# Patient Record
Sex: Female | Born: 1975 | Race: Asian | Hispanic: No | Marital: Married | Smoking: Never smoker
Health system: Southern US, Community
[De-identification: ages and names within clinical notes are randomized; demographics above are authoritative.]

## PROBLEM LIST (undated history)

## (undated) ENCOUNTER — Inpatient Hospital Stay (HOSPITAL_COMMUNITY): Payer: Self-pay

## (undated) DIAGNOSIS — M109 Gout, unspecified: Secondary | ICD-10-CM

## (undated) DIAGNOSIS — N979 Female infertility, unspecified: Secondary | ICD-10-CM

## (undated) DIAGNOSIS — E119 Type 2 diabetes mellitus without complications: Secondary | ICD-10-CM

## (undated) DIAGNOSIS — D500 Iron deficiency anemia secondary to blood loss (chronic): Secondary | ICD-10-CM

## (undated) DIAGNOSIS — E039 Hypothyroidism, unspecified: Secondary | ICD-10-CM

## (undated) DIAGNOSIS — O24419 Gestational diabetes mellitus in pregnancy, unspecified control: Secondary | ICD-10-CM

## (undated) DIAGNOSIS — G629 Polyneuropathy, unspecified: Secondary | ICD-10-CM

## (undated) DIAGNOSIS — F419 Anxiety disorder, unspecified: Secondary | ICD-10-CM

## (undated) DIAGNOSIS — G709 Myoneural disorder, unspecified: Secondary | ICD-10-CM

## (undated) HISTORY — DX: Female infertility, unspecified: N97.9

## (undated) HISTORY — DX: Polyneuropathy, unspecified: G62.9

## (undated) HISTORY — DX: Gout, unspecified: M10.9

## (undated) HISTORY — DX: Hypothyroidism, unspecified: E03.9

## (undated) HISTORY — DX: Type 2 diabetes mellitus without complications: E11.9

## (undated) HISTORY — PX: THYROIDECTOMY: SHX17

---

## 2000-04-21 ENCOUNTER — Other Ambulatory Visit: Admission: RE | Admit: 2000-04-21 | Discharge: 2000-04-21 | Payer: Self-pay | Admitting: Obstetrics and Gynecology

## 2000-09-20 ENCOUNTER — Inpatient Hospital Stay (HOSPITAL_COMMUNITY): Admission: AD | Admit: 2000-09-20 | Discharge: 2000-09-20 | Payer: Self-pay | Admitting: *Deleted

## 2000-09-21 ENCOUNTER — Inpatient Hospital Stay (HOSPITAL_COMMUNITY): Admission: AD | Admit: 2000-09-21 | Discharge: 2000-09-21 | Payer: Self-pay | Admitting: *Deleted

## 2000-11-16 ENCOUNTER — Inpatient Hospital Stay (HOSPITAL_COMMUNITY): Admission: AD | Admit: 2000-11-16 | Discharge: 2000-11-18 | Payer: Self-pay | Admitting: Obstetrics and Gynecology

## 2000-11-19 ENCOUNTER — Encounter: Admission: RE | Admit: 2000-11-19 | Discharge: 2000-12-19 | Payer: Self-pay | Admitting: Obstetrics and Gynecology

## 2000-11-19 ENCOUNTER — Inpatient Hospital Stay (HOSPITAL_COMMUNITY): Admission: AD | Admit: 2000-11-19 | Discharge: 2000-11-19 | Payer: Self-pay | Admitting: Obstetrics and Gynecology

## 2000-12-27 ENCOUNTER — Other Ambulatory Visit: Admission: RE | Admit: 2000-12-27 | Discharge: 2000-12-27 | Payer: Self-pay | Admitting: Obstetrics and Gynecology

## 2002-03-25 ENCOUNTER — Encounter (INDEPENDENT_AMBULATORY_CARE_PROVIDER_SITE_OTHER): Payer: Self-pay | Admitting: *Deleted

## 2002-03-25 LAB — CONVERTED CEMR LAB

## 2002-04-12 ENCOUNTER — Encounter: Admission: RE | Admit: 2002-04-12 | Discharge: 2002-04-12 | Payer: Self-pay | Admitting: Family Medicine

## 2002-04-13 ENCOUNTER — Encounter: Admission: RE | Admit: 2002-04-13 | Discharge: 2002-04-13 | Payer: Self-pay | Admitting: Family Medicine

## 2002-07-06 ENCOUNTER — Encounter: Admission: RE | Admit: 2002-07-06 | Discharge: 2002-07-06 | Payer: Self-pay | Admitting: Family Medicine

## 2002-07-09 ENCOUNTER — Ambulatory Visit (HOSPITAL_COMMUNITY): Admission: RE | Admit: 2002-07-09 | Discharge: 2002-07-09 | Payer: Self-pay | Admitting: General Surgery

## 2002-08-06 ENCOUNTER — Encounter: Admission: RE | Admit: 2002-08-06 | Discharge: 2002-08-06 | Payer: Self-pay | Admitting: Family Medicine

## 2002-08-13 ENCOUNTER — Encounter: Admission: RE | Admit: 2002-08-13 | Discharge: 2002-08-13 | Payer: Self-pay | Admitting: Family Medicine

## 2002-08-24 ENCOUNTER — Encounter: Admission: RE | Admit: 2002-08-24 | Discharge: 2002-08-24 | Payer: Self-pay | Admitting: Family Medicine

## 2002-08-24 ENCOUNTER — Inpatient Hospital Stay (HOSPITAL_COMMUNITY): Admission: AD | Admit: 2002-08-24 | Discharge: 2002-08-24 | Payer: Self-pay | Admitting: Family Medicine

## 2002-08-28 ENCOUNTER — Encounter: Admission: RE | Admit: 2002-08-28 | Discharge: 2002-08-28 | Payer: Self-pay | Admitting: Family Medicine

## 2002-09-04 ENCOUNTER — Encounter: Admission: RE | Admit: 2002-09-04 | Discharge: 2002-09-04 | Payer: Self-pay | Admitting: Family Medicine

## 2002-09-10 ENCOUNTER — Ambulatory Visit (HOSPITAL_COMMUNITY): Admission: RE | Admit: 2002-09-10 | Discharge: 2002-09-10 | Payer: Self-pay | Admitting: Family Medicine

## 2002-09-18 ENCOUNTER — Encounter: Admission: RE | Admit: 2002-09-18 | Discharge: 2002-09-18 | Payer: Self-pay | Admitting: Family Medicine

## 2002-09-27 ENCOUNTER — Inpatient Hospital Stay (HOSPITAL_COMMUNITY): Admission: AD | Admit: 2002-09-27 | Discharge: 2002-09-29 | Payer: Self-pay | Admitting: Obstetrics and Gynecology

## 2004-02-23 DIAGNOSIS — E0520 Thyrotoxicosis with toxic multinodular goiter without thyrotoxic crisis or storm: Secondary | ICD-10-CM

## 2004-02-23 HISTORY — DX: Thyrotoxicosis with toxic multinodular goiter without thyrotoxic crisis or storm: E05.20

## 2004-02-23 HISTORY — PX: THYROIDECTOMY: SHX17

## 2004-09-29 ENCOUNTER — Encounter (HOSPITAL_COMMUNITY): Admission: RE | Admit: 2004-09-29 | Discharge: 2004-12-28 | Payer: Self-pay | Admitting: Endocrinology

## 2004-10-29 ENCOUNTER — Other Ambulatory Visit: Admission: RE | Admit: 2004-10-29 | Discharge: 2004-10-29 | Payer: Self-pay | Admitting: Interventional Radiology

## 2004-10-29 ENCOUNTER — Encounter: Admission: RE | Admit: 2004-10-29 | Discharge: 2004-10-29 | Payer: Self-pay | Admitting: Endocrinology

## 2004-10-29 ENCOUNTER — Encounter (INDEPENDENT_AMBULATORY_CARE_PROVIDER_SITE_OTHER): Payer: Self-pay | Admitting: Specialist

## 2004-12-10 ENCOUNTER — Ambulatory Visit (HOSPITAL_COMMUNITY): Admission: RE | Admit: 2004-12-10 | Discharge: 2004-12-11 | Payer: Self-pay | Admitting: General Surgery

## 2004-12-10 ENCOUNTER — Encounter (INDEPENDENT_AMBULATORY_CARE_PROVIDER_SITE_OTHER): Payer: Self-pay | Admitting: *Deleted

## 2005-12-26 ENCOUNTER — Emergency Department (HOSPITAL_COMMUNITY): Admission: EM | Admit: 2005-12-26 | Discharge: 2005-12-26 | Payer: Self-pay | Admitting: Family Medicine

## 2006-04-21 DIAGNOSIS — E890 Postprocedural hypothyroidism: Secondary | ICD-10-CM | POA: Insufficient documentation

## 2006-04-22 ENCOUNTER — Encounter (INDEPENDENT_AMBULATORY_CARE_PROVIDER_SITE_OTHER): Payer: Self-pay | Admitting: *Deleted

## 2008-09-23 ENCOUNTER — Emergency Department (HOSPITAL_COMMUNITY): Admission: EM | Admit: 2008-09-23 | Discharge: 2008-09-23 | Payer: Self-pay | Admitting: Family Medicine

## 2008-11-25 ENCOUNTER — Emergency Department (HOSPITAL_COMMUNITY): Admission: EM | Admit: 2008-11-25 | Discharge: 2008-11-25 | Payer: Self-pay | Admitting: Family Medicine

## 2008-11-26 ENCOUNTER — Emergency Department (HOSPITAL_COMMUNITY): Admission: EM | Admit: 2008-11-26 | Discharge: 2008-11-26 | Payer: Self-pay | Admitting: Family Medicine

## 2008-12-17 ENCOUNTER — Encounter (HOSPITAL_COMMUNITY): Admission: RE | Admit: 2008-12-17 | Discharge: 2009-02-21 | Payer: Self-pay | Admitting: Endocrinology

## 2009-01-20 ENCOUNTER — Ambulatory Visit (HOSPITAL_COMMUNITY): Admission: RE | Admit: 2009-01-20 | Discharge: 2009-01-20 | Payer: Self-pay | Admitting: Endocrinology

## 2009-06-08 ENCOUNTER — Emergency Department (HOSPITAL_COMMUNITY): Admission: EM | Admit: 2009-06-08 | Discharge: 2009-06-08 | Payer: Self-pay | Admitting: Family Medicine

## 2009-10-11 ENCOUNTER — Emergency Department (HOSPITAL_COMMUNITY): Admission: EM | Admit: 2009-10-11 | Discharge: 2009-10-11 | Payer: Self-pay | Admitting: Family Medicine

## 2010-01-19 ENCOUNTER — Emergency Department (HOSPITAL_COMMUNITY): Admission: EM | Admit: 2010-01-19 | Discharge: 2010-01-19 | Payer: Self-pay | Admitting: Family Medicine

## 2010-05-05 LAB — POCT I-STAT, CHEM 8
BUN: 10 mg/dL (ref 6–23)
Calcium, Ion: 1.12 mmol/L (ref 1.12–1.32)
Chloride: 104 mEq/L (ref 96–112)
Creatinine, Ser: 0.7 mg/dL (ref 0.4–1.2)
Glucose, Bld: 103 mg/dL — ABNORMAL HIGH (ref 70–99)
HCT: 41 % (ref 36.0–46.0)
Hemoglobin: 13.9 g/dL (ref 12.0–15.0)
Potassium: 3.7 mEq/L (ref 3.5–5.1)
Sodium: 139 mEq/L (ref 135–145)
TCO2: 26 mmol/L (ref 0–100)

## 2010-05-05 LAB — POCT PREGNANCY, URINE: Preg Test, Ur: NEGATIVE

## 2010-05-08 LAB — POCT I-STAT, CHEM 8
BUN: 10 mg/dL (ref 6–23)
Calcium, Ion: 1.14 mmol/L (ref 1.12–1.32)
Chloride: 106 mEq/L (ref 96–112)
Creatinine, Ser: 0.8 mg/dL (ref 0.4–1.2)
Glucose, Bld: 91 mg/dL (ref 70–99)
HCT: 42 % (ref 36.0–46.0)
Hemoglobin: 14.3 g/dL (ref 12.0–15.0)
Potassium: 4.3 mEq/L (ref 3.5–5.1)
Sodium: 140 mEq/L (ref 135–145)
TCO2: 25 mmol/L (ref 0–100)

## 2010-05-12 LAB — POCT RAPID STREP A (OFFICE): Streptococcus, Group A Screen (Direct): NEGATIVE

## 2010-05-27 LAB — HCG, SERUM, QUALITATIVE: Preg, Serum: NEGATIVE

## 2010-05-31 LAB — POCT URINALYSIS DIP (DEVICE)
Bilirubin Urine: NEGATIVE
Glucose, UA: NEGATIVE mg/dL
Ketones, ur: NEGATIVE mg/dL
Nitrite: NEGATIVE
Protein, ur: >=300 mg/dL — AB
Specific Gravity, Urine: 1.01 (ref 1.005–1.030)
Urobilinogen, UA: 0.2 mg/dL (ref 0.0–1.0)
pH: 7 (ref 5.0–8.0)

## 2010-05-31 LAB — URINE CULTURE: Colony Count: >=100000

## 2010-05-31 LAB — POCT PREGNANCY, URINE: Preg Test, Ur: NEGATIVE

## 2010-05-31 LAB — PREGNANCY, URINE: Preg Test, Ur: NEGATIVE

## 2010-07-10 NOTE — H&P (Signed)
[facility]  Patient:    Briana Fuentes, Briana Fuentes Visit Number: [id number] MRN: [id number]          Service Type: Attending:  Silverio Lay, M.D. Dictated by:   Silverio Lay, M.D. Adm. Date:  11/16/00                           History and Physical  DATE OF BIRTH:                [birth year]  REASON FOR ADMISSION:         Intrauterine pregnancy, at 40 weeks and 1 day, known for fetal right pelvic mass, probably ovarian in nature, to undergo elective induction of labor.  HISTORY OF PRESENT ILLNESS:   This is a 35 year old, married, Pakistani woman, gravida 1, para 0, with a due date by ultrasound of November 15, 2000, who is being admitted at 40 weeks and 1 day to undergo elective induction of labor for a favorable cervix and a baby female fetus with right pelvic mass thought to be of ovarian in origin.  She was last seen in the office on November 15, 2000, reporting good fetal activity, denying any leakage of water, denying any bleeding, denying any pregnancy-induced hypertension symptoms. She also reports a class A1 diabetes that is very well controlled with diet only, of course with elevation of CBGs with dietary indiscretions.  Her ultrasound in the office today for a nonreactive NST revealed a biophysical profile 8 on 10.  Umbilical artery Dopplers within normal limits. Amniotic fluid index at the higher range 20.7 or 90th percentile. Estimated fetal weight at 7 pounds 7 ounces, 25th percentile.  PRENATAL COURSE:              Reveals blood type O positive, sickle cell negative, calcemia negative, RPR nonreactive, rubella-immune, HBsAg negative. HIV negative. Gonorrhea negative. Chlamydia negative and Pap smear within normal limits. First trimester was complicated by unexplained hematuria which completely resolved. Second trimester showed a abnormalities AFP with an increase in Down syndrome risk of 1:199.  The patient underwent an ultrasound and a  change of due date which now normalized her AFP.  Her 20-week ultrasound revealed a normal anatomy survey. Estimated fetal weight at 63rd percentile, anterior placenta, normal cervical length.  Third trimester was complicated by preterm cervical change at 32 weeks which was managed with bed rest and betamethasone received on July 30 and July 31. Her 28-week glucose tolerance test was elevated, three-hour glucose tolerance test was compatible with class A1 diabetes, and the patient was subsequently managed with diet and remained well controlled throughout pregnancy. Her 29-week ultrasound revealed an estimated fetal weight in the 23rd percentile, normal amniotic fluid index and the 44th percentile.  At 33 weeks an ultrasound was performed for growth where estimated fetal weight was 37 percentile, amniotic fluid index was 64th percentile and a new appearance of a right cystic pelvic mass in the fetus between kidney and bladder measuring 3 x 2.6 x 3 cm. The patient was sent for a consultative ultrasound with Dr. Delton See at [facility] and his conclusions were that his ultrasound was compatible with ovarian mass of unknown etiology, possibly teratoma or ______.  He considered that delivery was safe at [facility], and he would recommend an MRI or a pelvic ultrasound at the birth of the baby. Her 35-week group B strep was negative.  The patient was rescanned at 36 weeks and the  right pelvic mass remained unchanged. On the ultrasound performed on September 24 we could not see the pelvic mass but the bladder seemed slightly enlarged.  ALLERGIES:                    No known drug allergies.  FAMILY HISTORY:               Father with myocardial infarct, hypertension and diabetes. Paternal grandfather with myocardial infarct.  SOCIAL HISTORY:               Married, nonsmoker.  Works as a Conservation officer, nature in [employer].  PHYSICAL EXAMINATION:  VITAL SIGNS:                  Current weight is 173  pounds .4 for a total weight gain of 38 pounds.  Blood pressure 98/60.  HEENT:                        Negative.  LUNGS:                        Clear.  HEART:                        Normal.  ABDOMEN:                      Gravid, nontender.  Fundal height at 39 cm, vertex presentation.  PELVIC:                       Vaginal examination, 3 cm, 90% effaced, vertex -1.  EXTREMITIES:                  Negative.  ASSESSMENT:                   Intrauterine pregnancy, at 41 weeks and 1 day with favorable cervix, fetal right pelvic mass of unknown etiology, most likely ovarian.  Class A1 well controlled diabetes, status post betamethasone on July 30 and July 31 and group B streptococcus negative.  PLAN:                         The patient is admitted on November 16, 2000, to undergo elective induction of labor with artificial rupture of membranes as well as Pitocin.  She is well aware of the slightly increased risks of cesarean section.  We will inform her pediatrician, Dr. Winona Legato of the birth of the baby for followup. Dictated by:   Silverio Lay, M.D. Attending:  Silverio Lay, M.D. DD:  11/15/00 TD:  11/15/00 Job: [id number]

## 2010-07-10 NOTE — Op Note (Signed)
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   PROCEDURE:  1.  Excision of central neck mass.  2.  Right fibroid lobectomy and isthmusectomy.   SURGEON:  Adolph Pollack, M.D.   ASSISTANT:  Currie Paris, M.D.   ANESTHESIA:  General.   INDICATIONS:  This 35 year old female had a large mass in the right lower  neck for over 5 years. It has been shown to be a multinodular goiter and  also has a cold nodule superiorly which was biopsied and had follicular  changes. She now presents for the above procedure. The procedure and the  risks have been discussed with her preoperatively.   TECHNIQUE:  She was seen in the holding area and brought to the operating  room, placed supine on the operating table and a general anesthetic was  administered. A roll was placed under her shoulders and her neck was placed  in the extension position. The neck and upper chest were sterilely prepped  and draped. An incision was marked approximately a fingerbreadth superior to  the heads of the clavicles and a curvilinear transverse neck incision was  made through the skin and subcutaneous tissue. The platysma muscle was  divided. Subplatysmal flaps were raised up to the thyroid cartilage  superiorly and down to the sternal notch inferiorly. Inferiorly, I found the  precervical fascia between the strap muscles and divided it. Using careful  blunt dissection and cautery, I   divided the strap muscle and dissected it  free from the goitrous lesion in the right thyroid gland. Using blunt  dissection, I was able to bring the lesion up toward the midline. Using more  blunt dissection, I separated thin attachments superiorly and inferiorly. I  then identified the superior pole vessels and divided them on the thyroid  capsule in between clips. I approached the inferior aspect of the gland and  divided  the inferior vessels in the midline between clips. I then used  blunt dissection and identified the recurrent laryngeal nerve and traced it  into its origin into the cricothyroid muscle. Underlying it was a superior  parathyroid gland. I saw the inferior parathyroid gland and dissected this  free from the inferior pole of the thyroid. Staying on the capsule, I then  divided middle thyroidal vessels between clips. The suspensory ligament of  Allyson Sabal was identified and the small veins were divided between clips  mobilizing the thyroid lobe.  The recurrent laryngeal nerve was under some  stretch but was left intact without evidence of injury. I  then dissected the  right lower thyroid gland off the trachea and put a clamp between the  isthmus and left lobe and resected the isthmus along with the goitrous  lesion in the right thyroid lobe. This was sent as frozen section and the  lesion in the upper pole was noted to be follicular but no obvious  malignancy.   I did note that on the anterior aspect of the strap muscles was a 1 cm mass,  I could not tell if it was a lymph node or otherwise and I resected this and  sent it for permanent section.   I inspected and irrigated the wound. There was some bleeding coming from an  area just superior and adjacent to the recurrent laryngeal nerve. I applied  some FloSeal and Surgicel and this lead to successful hemostasis. Further  inspection was performed. No bleeding was noted. The nerve was reidentified  and was intact.   The  strap muscles were then approximated with interrupted 3-0 Vicryl sutures  leaving the gap inferiorly. No blood was coming from that. The platysma  muscles were approximated with interrupted 3-0 Vicryl sutures. The skin was  closed with running 4-0 Monocryl subcuticular stitch followed by Steri-  Strips and sterile dressings. She tolerated the procedure well without any  apparent complications and was taken to the recovery room in satisfactory  condition.      Adolph Pollack, M.D.  Electronically Signed     TJR/MEDQ  D:  12/10/2004  T:  12/10/2004  Job:  [id number]   cc:   Dorisann Frames, M.D.  Fax: [phone]   Ursula Beath, MD  Fax: [phone]

## 2012-01-15 ENCOUNTER — Emergency Department (HOSPITAL_COMMUNITY): Payer: Self-pay

## 2012-01-15 ENCOUNTER — Emergency Department (HOSPITAL_COMMUNITY)
Admission: EM | Admit: 2012-01-15 | Discharge: 2012-01-16 | Disposition: A | Discharge status: Home / Self Care | Payer: Self-pay | Attending: Emergency Medicine | Admitting: Emergency Medicine

## 2012-01-15 ENCOUNTER — Encounter (HOSPITAL_COMMUNITY): Payer: Self-pay | Admitting: *Deleted

## 2012-01-15 DIAGNOSIS — H5710 Ocular pain, unspecified eye: Secondary | ICD-10-CM | POA: Insufficient documentation

## 2012-01-15 DIAGNOSIS — R109 Unspecified abdominal pain: Secondary | ICD-10-CM | POA: Insufficient documentation

## 2012-01-15 DIAGNOSIS — R51 Headache: Secondary | ICD-10-CM | POA: Insufficient documentation

## 2012-01-15 DIAGNOSIS — R110 Nausea: Secondary | ICD-10-CM | POA: Insufficient documentation

## 2012-01-15 LAB — CBC WITH DIFFERENTIAL/PLATELET
Basophils Absolute: 0.1 10*3/uL (ref 0.0–0.1)
Basophils Relative: 0 % (ref 0–1)
Eosinophils Absolute: 0.2 10*3/uL (ref 0.0–0.7)
Eosinophils Relative: 2 % (ref 0–5)
HCT: 38.7 % (ref 36.0–46.0)
Hemoglobin: 13.1 g/dL (ref 12.0–15.0)
Lymphocytes Relative: 29 % (ref 12–46)
Lymphs Abs: 4.3 10*3/uL — ABNORMAL HIGH (ref 0.7–4.0)
MCH: 29 pg (ref 26.0–34.0)
MCHC: 33.9 g/dL (ref 30.0–36.0)
MCV: 85.6 fL (ref 78.0–100.0)
Monocytes Absolute: 0.9 10*3/uL (ref 0.1–1.0)
Monocytes Relative: 6 % (ref 3–12)
Neutro Abs: 9.1 10*3/uL — ABNORMAL HIGH (ref 1.7–7.7)
Neutrophils Relative %: 63 % (ref 43–77)
Platelets: 310 10*3/uL (ref 150–400)
RBC: 4.52 MIL/uL (ref 3.87–5.11)
RDW: 13.5 % (ref 11.5–15.5)
WBC: 14.6 10*3/uL — ABNORMAL HIGH (ref 4.0–10.5)

## 2012-01-15 LAB — BASIC METABOLIC PANEL
BUN: 10 mg/dL (ref 6–23)
CO2: 21 mEq/L (ref 19–32)
Calcium: 9.5 mg/dL (ref 8.4–10.5)
Chloride: 102 mEq/L (ref 96–112)
Creatinine, Ser: 0.58 mg/dL (ref 0.50–1.10)
GFR calc Af Amer: >90 mL/min (ref >90)
GFR calc non Af Amer: >90 mL/min (ref >90)
Glucose, Bld: 104 mg/dL — ABNORMAL HIGH (ref 70–99)
Potassium: 3.7 mEq/L (ref 3.5–5.1)
Sodium: 136 mEq/L (ref 135–145)

## 2012-01-15 MED ORDER — ONDANSETRON HCL 4 MG/2ML IJ SOLN
4.0000 mg | Freq: Once | INTRAMUSCULAR | Status: AC
  Administered 2012-01-15: 4 mg via INTRAVENOUS
  Filled 2012-01-15: qty 2

## 2012-01-15 MED ORDER — MORPHINE SULFATE 4 MG/ML IJ SOLN
4.0000 mg | Freq: Once | INTRAMUSCULAR | Status: AC
  Administered 2012-01-15: 4 mg via INTRAVENOUS
  Filled 2012-01-15: qty 1

## 2012-01-15 MED ORDER — TETRACAINE HCL 0.5 % OP SOLN
2.0000 [drp] | Freq: Once | OPHTHALMIC | Status: AC
  Administered 2012-01-15: 2 [drp] via OPHTHALMIC
  Filled 2012-01-15: qty 2

## 2012-01-15 MED ORDER — FLUORESCEIN SODIUM 1 MG OP STRP
ORAL_STRIP | OPHTHALMIC | Status: AC
  Filled 2012-01-15: qty 2

## 2012-01-15 NOTE — ED Notes (Signed)
The pt is [redacted] weeks pregnant and has had a headache for 14 days.  She is a diabetic.  edc unknown her rt eye is red and irritated.  She has head pain on the rt side

## 2012-01-15 NOTE — ED Notes (Signed)
Patient back from CT.

## 2012-01-15 NOTE — ED Notes (Signed)
Notified pt that we need urine sample 

## 2012-01-15 NOTE — ED Notes (Signed)
Patient transported to CT 

## 2012-01-15 NOTE — ED Provider Notes (Signed)
History     CSN: [id number]  Arrival date & time 01/15/12  1907   First MD Initiated Contact with Patient 01/15/12 1925     Chief Complaint  Patient presents with  . Headache   HPI: Ms. Gaetz is a 36 yo F, who is currently [redacted] weeks pregnant, presents with headache, pain with eye movement and conjunctival infection. Symptoms started 14 days ago with mild left sided headache. She describes the pain as sharp, constant, located behind right eye, radiates to back of head, initially relieved partially with Tylenol. Associated with painful eye movement, conjunctival injection and proptosis. Today her pain worsened and she was no longer able to control pain with Tylenol so she presented for evaluation. She endorse blurred vision but no loss of vision. Also endorses mild abdominal cramping today and small amount of vaginal spotting.   No past medical history on file.  No past surgical history on file.  No family history on file.  History  Substance Use Topics  . Smoking status: Not on file  . Smokeless tobacco: Not on file  . Alcohol Use: Not on file    OB History    Grav Para Term Preterm Abortions TAB SAB Ect Mult Living   1              Review of Systems  Constitutional: Negative for chills, appetite change and fatigue.  HENT: Negative for congestion, rhinorrhea and trouble swallowing.   Eyes: Positive for pain, redness and visual disturbance. Negative for photophobia.  Respiratory: Negative for cough, shortness of breath and wheezing.   Cardiovascular: Negative for chest pain and leg swelling.  Gastrointestinal: Positive for nausea and abdominal pain. Negative for vomiting.  Genitourinary: Negative for dysuria, decreased urine volume and difficulty urinating.  Musculoskeletal: Negative for back pain and arthralgias.  Skin: Negative for color change and pallor.  Neurological: Positive for headaches. Negative for dizziness, speech difficulty and weakness.  Psychiatric/Behavioral:  Negative for confusion and agitation.  All other systems reviewed and are negative.    Allergies  Review of patient's allergies indicates no known allergies.  Home Medications   Current Outpatient Rx  Name  Route  Sig  Dispense  Refill  . BUTALBITAL-APAP-CAFF-COD 50-325-40-30 MG PO CAPS   Oral   Take 1 capsule by mouth every 4 (four) hours as needed. For headache         . LEVOTHYROXINE SODIUM 125 MCG PO TABS   Oral   Take 125 mcg by mouth daily.         [name] METFORMIN HCL ER (MOD) 500 MG PO TB24   Oral   Take 500 mg by mouth daily with breakfast.           BP 118/80  Pulse 74  Temp 98.3 F (36.8 C) (Oral)  Resp 20  SpO2 99%  Physical Exam  Nursing note and vitals reviewed. Constitutional: She is oriented to person, place, and time. She appears well-developed. She is cooperative. She does not have a sickly appearance.  HENT:  Head: Normocephalic and atraumatic.  Mouth/Throat: Mucous membranes are normal. No oropharyngeal exudate.  Eyes: EOM are normal. Pupils are equal, round, and reactive to light. Right conjunctiva is injected.         Mild right eye proptosis   Neck: Trachea normal and normal range of motion. Neck supple. No JVD present.  Cardiovascular: Normal rate, regular rhythm, S1 normal, S2 normal and normal heart sounds.   Pulmonary/Chest: Effort normal and breath sounds  normal.  Abdominal: Soft. Bowel sounds are normal.  Musculoskeletal: Normal range of motion.  Neurological: She is alert and oriented to person, place, and time. She has normal strength and normal reflexes. No cranial nerve deficit or sensory deficit. Gait normal. GCS eye subscore is 4. GCS verbal subscore is 5. GCS motor subscore is 6.  Skin: Skin is warm and dry.   ED Course  Procedures   Labs Reviewed - No data to display Ct Head Wo Contrast  01/15/2012  *RADIOLOGY REPORT*  Clinical Data: Right eye swelling.  Pain behind the right eye. Right frontal headache.  Proptosis.  The  patient is [redacted] weeks pregnant and abdomen was shielded for the examination.  CT HEAD WITHOUT CONTRAST  Technique:  Contiguous axial images were obtained from the base of the skull through the vertex without contrast.  Comparison: None.  Findings: The ventricles and sulci are symmetrical without significant effacement, displacement, or dilatation. No mass effect or midline shift. No abnormal extra-axial fluid collections. The grey-white matter junction is distinct. Basal cisterns are not effaced. No acute intracranial hemorrhage. No depressed skull fractures.  Visualized paranasal sinuses and mastoid air cells are not opacified.  The globes and extraocular muscles appear intact and symmetrical.  No retroconal or preseptal infiltration or mass.  IMPRESSION: No acute intracranial abnormalities.  Visualized orbits and paranasal sinuses appear symmetrical.   Original Report Authenticated By: Burman Nieves, M.D.    1. Headache   2. Eye pain    MDM   36 yo F, who is currently [redacted] weeks pregnant, presents with headache, pain with eye movement proptosis and conjunctival infection. Afebrile, vital signs stable. Concern for orbital cellulitis vs mass vs cavernous sinus thrombosis vs acute angle closure glaucoma. Patient also with mild abdominal cramping and spotting today. She has confirmed IUP at [redacted] weeks gestation. Labs reveal WBC 14.6, Hgb 13.1, normal lytes and cr. Head CT without acute abnormality. Ocular pressure 18. Vision 20/50 in R eye, 20/30 in L. No evidence of CN III palsy. Doubt orbital cellulitis as not febrile, no infection noted on CT. Unknown cause of symptoms. Discussed case with Ophthalmology who recommended steroid gtt and he will see in the AM. Felt cavernous sinus thrombosis was very unlikely as no CN III palsy. As for abdominal cramping and vaginal spotting she was RH + so no RhoGam indicated. Bedside US revealed FHR of 155, fetus active. Patient to f/u with OB on Tuesday. Instructed on eye drop use  to occlude lacrimal duct. Her pain was treated with morphine with relief of symptoms. Return precautions to include worsening HA, SOB, vision loss, fever, or other concerning symptoms were given. Patient in agreement with plan.   Reviewed imaging, labs and previous medical records, utilized in MDM  Clinical Impression 1. Headache 2. Proptosis 3. IUP        Margie Billet, MD 01/16/12 [phone]

## 2012-01-16 LAB — ABO/RH: ABO/RH(D): O POS

## 2012-01-16 MED ORDER — PREDNISOLONE ACETATE 0.12 % OP SUSP: 2.0000 [drp] | Freq: Four times a day (QID) | OPHTHALMIC | Status: DC

## 2012-01-16 MED ORDER — HYDROCODONE-ACETAMINOPHEN 5-325 MG PO TABS: 1.0000 | ORAL_TABLET | Freq: Four times a day (QID) | ORAL | Status: DC | PRN

## 2012-01-16 MED ORDER — PREDNISOLONE ACETATE 0.12 % OP SUSP
2.0000 [drp] | Freq: Once | OPHTHALMIC | Status: DC
  Filled 2012-01-16: qty 5

## 2012-01-16 NOTE — ED Provider Notes (Signed)
I saw and evaluated the patient, reviewed the resident's note and I agree with the findings and plan.  Patient with several week hx of right eye pain, redness, and swelling. CT scan of head and orbits negative for intracranial findings and or orbital cellulitis. Ocular pressures normal and not cw acute glaucoma. Discussed with on call Opthomalogy and they will follow up in office on Sunday. Medications prescribed as per their recommendations.   Shelda Jakes, MD 01/16/12 1300

## 2012-02-09 ENCOUNTER — Ambulatory Visit: Payer: Self-pay | Admitting: Dietician

## 2012-04-13 ENCOUNTER — Other Ambulatory Visit: Payer: BC Managed Care – PPO

## 2012-04-13 LAB — POCT PREGNANCY, URINE: Preg Test, Ur: POSITIVE — AB

## 2012-04-19 DIAGNOSIS — O24919 Unspecified diabetes mellitus in pregnancy, unspecified trimester: Secondary | ICD-10-CM | POA: Insufficient documentation

## 2012-05-01 ENCOUNTER — Inpatient Hospital Stay (HOSPITAL_COMMUNITY)
Admission: AD | Admit: 2012-05-01 | Discharge: 2012-05-01 | Disposition: A | Discharge status: Home / Self Care | Payer: BC Managed Care – PPO | Source: Ambulatory Visit | Attending: Obstetrics & Gynecology | Admitting: Obstetrics & Gynecology

## 2012-05-01 ENCOUNTER — Encounter (HOSPITAL_COMMUNITY): Payer: Self-pay | Admitting: *Deleted

## 2012-05-01 ENCOUNTER — Inpatient Hospital Stay (HOSPITAL_COMMUNITY): Payer: BC Managed Care – PPO

## 2012-05-01 ENCOUNTER — Encounter: Payer: Self-pay | Admitting: Obstetrics and Gynecology

## 2012-05-01 ENCOUNTER — Ambulatory Visit (INDEPENDENT_AMBULATORY_CARE_PROVIDER_SITE_OTHER): Payer: BC Managed Care – PPO | Admitting: Obstetrics and Gynecology

## 2012-05-01 ENCOUNTER — Other Ambulatory Visit: Payer: Self-pay | Admitting: Obstetrics and Gynecology

## 2012-05-01 VITALS — BP 93/63 | Temp 96.8°F | Ht 65.0 in | Wt 172.7 lb

## 2012-05-01 DIAGNOSIS — O4590 Premature separation of placenta, unspecified, unspecified trimester: Secondary | ICD-10-CM

## 2012-05-01 DIAGNOSIS — O09212 Supervision of pregnancy with history of pre-term labor, second trimester: Secondary | ICD-10-CM

## 2012-05-01 DIAGNOSIS — E119 Type 2 diabetes mellitus without complications: Secondary | ICD-10-CM | POA: Insufficient documentation

## 2012-05-01 DIAGNOSIS — O09529 Supervision of elderly multigravida, unspecified trimester: Secondary | ICD-10-CM

## 2012-05-01 DIAGNOSIS — O4690 Antepartum hemorrhage, unspecified, unspecified trimester: Secondary | ICD-10-CM | POA: Insufficient documentation

## 2012-05-01 DIAGNOSIS — O09219 Supervision of pregnancy with history of pre-term labor, unspecified trimester: Secondary | ICD-10-CM | POA: Insufficient documentation

## 2012-05-01 DIAGNOSIS — O24912 Unspecified diabetes mellitus in pregnancy, second trimester: Secondary | ICD-10-CM

## 2012-05-01 DIAGNOSIS — O99891 Other specified diseases and conditions complicating pregnancy: Secondary | ICD-10-CM

## 2012-05-01 DIAGNOSIS — O4592 Premature separation of placenta, unspecified, second trimester: Secondary | ICD-10-CM

## 2012-05-01 DIAGNOSIS — E0590 Thyrotoxicosis, unspecified without thyrotoxic crisis or storm: Secondary | ICD-10-CM

## 2012-05-01 DIAGNOSIS — O24919 Unspecified diabetes mellitus in pregnancy, unspecified trimester: Secondary | ICD-10-CM | POA: Insufficient documentation

## 2012-05-01 DIAGNOSIS — O09522 Supervision of elderly multigravida, second trimester: Secondary | ICD-10-CM

## 2012-05-01 DIAGNOSIS — O09299 Supervision of pregnancy with other poor reproductive or obstetric history, unspecified trimester: Secondary | ICD-10-CM

## 2012-05-01 DIAGNOSIS — E039 Hypothyroidism, unspecified: Secondary | ICD-10-CM | POA: Insufficient documentation

## 2012-05-01 LAB — POCT URINALYSIS DIP (DEVICE)
Bilirubin Urine: NEGATIVE
Glucose, UA: NEGATIVE mg/dL
Ketones, ur: NEGATIVE mg/dL
Nitrite: NEGATIVE
Protein, ur: 30 mg/dL — AB
Specific Gravity, Urine: 1.025 (ref 1.005–1.030)
Urobilinogen, UA: 0.2 mg/dL (ref 0.0–1.0)
pH: 6 (ref 5.0–8.0)

## 2012-05-01 LAB — WET PREP, GENITAL
Clue Cells Wet Prep HPF POC: NONE SEEN
Trich, Wet Prep: NONE SEEN
Yeast Wet Prep HPF POC: NONE SEEN

## 2012-05-01 MED ORDER — HYDROXYPROGESTERONE CAPROATE 250 MG/ML IM OIL: 250.0000 mg | TOPICAL_OIL | INTRAMUSCULAR | Status: DC

## 2012-05-01 MED ORDER — INFLUENZA VIRUS VACC SPLIT PF IM SUSP
0.5000 mL | Freq: Once | INTRAMUSCULAR | Status: AC
  Administered 2012-05-01: 0.5 mL via INTRAMUSCULAR

## 2012-05-01 NOTE — Progress Notes (Signed)
Subjective:    Briana Fuentes is a [id number] [redacted]w[redacted]d being seen today for her first obstetrical visit.  Her obstetrical history is significant for advanced maternal age and type II DM, hypothyroidism and h/o preterm birth. Patient does intend to breast feed. Pregnancy history fully reviewed.  Patient reports no complaints. This was a pregnancy conceived by IUI. She self- referred herself to our office secondary to increase cost of prenatal care at [facility]. Patient is very excited about this pregnancy. She has been taking metformin 500 BID and states her postprandial cbg are in 140's range. Patient states she was instructed to increase to 1000 BID but was not able tolerate secondary to GI upset. Patient reports normal TSH 2 months ago. Patient reports being approximately 36 weeks when PPROM occurred with last pregnancy. She is interested in receiving 17-P for this pregnancy.  Filed Vitals:   05/01/12 0954 05/01/12 0957  BP: 93/63   Temp: 96.8 F (36 C)   Height:  5\' 5"  (1.651 m)  Weight: 172 lb 11.2 oz (78.336 kg)     HISTORY: OB History   Grav Para Term Preterm Abortions TAB SAB Ect Mult Living   3 2 1 1  0 0 0 0 0 2     # Outc Date GA Lbr Len/2nd Wgt Sex Del Anes PTL Lv   1 TRM 9/02 [redacted]w[redacted]d  7lb8oz(3.402kg) F SVD EPI  Yes   2 PRE 8/04 [redacted]w[redacted]d  5lb8oz(2.495kg) M SVD EPI Yes Yes   Comments: patient states her water broke 1 month early- not sure of exact gestation    3 CUR              Past Medical History  Diagnosis Date  . Hypothyroidism   . Diabetes mellitus without complication   . Infertility, female    Past Surgical History  Procedure Laterality Date  . Thyroidectomy     Family History  Problem Relation Age of Onset  . Heart disease Father   . Stroke Father      Exam    Uterus:     Pelvic Exam:    Perineum: Normal Perineum   Vulva: normal   Vagina:  normal mucosa   pH:    Cervix: closed and long   Adnexa: not evaluated   Bony Pelvis: android  System: Breast:   normal appearance, no masses or tenderness   Skin: normal coloration and turgor, no rashes    Neurologic: oriented, grossly non-focal   Extremities: normal strength, tone, and muscle mass   HEENT extra ocular movement intact   Mouth/Teeth mucous membranes moist, pharynx normal without lesions   Neck supple and no masses   Cardiovascular: regular rate and rhythm   Respiratory:  chest clear, no wheezing, crepitations, rhonchi, normal symmetric air entry   Abdomen: soft, gravid, NT   Urinary:       Assessment:    Pregnancy: [id number] Patient Active Problem List  Diagnosis  . HYPERTHYROIDISM, NOS  . Diabetes mellitus, antepartum  . AMA (advanced maternal age) multigravida 35+  . DM w/o complication type II  . Hyperthyroidism complicating pregnancy  . Previous preterm delivery, antepartum        Plan:     Initial labs drawn. Prenatal vitamins. Problem list reviewed and updated. Genetic Screening discussed : offered [facility].  Ultrasound discussed; fetal survey: ordered. Advised patient to monitor CBG fasting and 2 hr postprandial. Patient to meet with diabetic counselor Patient referred to MFM for anatomy ultrasound with genetic counseling  secondary to AMA Fetal echo also ordered Baseline 24 hr urine also ordered Patient interested in weekly 17-p  Follow up in 2 weeks. 50% of 30 min visit spent on counseling and coordination of care.     CONSTANT,[name] 05/01/2012

## 2012-05-01 NOTE — Progress Notes (Signed)
Pulse- 78 Patient reports occasional abdominal pain

## 2012-05-01 NOTE — MAU Note (Addendum)
States had first appointment in [facility] today. States she had blood drawn and was given a flu shot. States started bleeding around 1600. Blood was in toilet and on panties. States she had a scant amount of bleeding in 1st trimester. None since until now. States she has some upper abdominal cramping and burning. States she is on Metformin for diabetes. States started to have intercourse yesterday, but stopped because they were scared. States she noted some rectal bleeding today also.

## 2012-05-01 NOTE — MAU Provider Note (Signed)
History     CSN: [id number]  Arrival date and time: 05/01/12 1621   First Provider Initiated Contact with Patient 05/01/12 1642      Chief Complaint  Patient presents with  . Vaginal Bleeding   HPI This is a 37 y.o. female at [redacted]w[redacted]d who presents with c/o vaginal bleeding. Was seen by Dr Jolayne Panther today but states there was no pelvic done. Went to the bathroom and saw bright red blood. Denies abdominal pain or contractions.  Rn Note: States had first appointment in [facility] today. States she had blood drawn and was given a flu shot. States started bleeding around 1600. Blood was in toilet and on panties. States she had a scant amount of bleeding in 1st trimester. None since until now. States she has some upper abdominal cramping and burning. States she is on Metformin for diabetes. States started to have intercourse yesterday, but stopped because they were scared. States she noted some rectal bleeding today also  OB History   Grav Para Term Preterm Abortions TAB SAB Ect Mult Living   3 2 1 1  0 0 0 0 0 2      Past Medical History  Diagnosis Date  . Hypothyroidism   . Diabetes mellitus without complication   . Infertility, female     Past Surgical History  Procedure Laterality Date  . Thyroidectomy      Family History  Problem Relation Age of Onset  . Heart disease Father   . Stroke Father     History  Substance Use Topics  . Smoking status: Never Smoker   . Smokeless tobacco: Never Used  . Alcohol Use: No    Allergies: No Known Allergies  Prescriptions prior to admission  Medication Sig Dispense Refill  . acetaminophen (TYLENOL) 325 MG tablet Take 325 mg by mouth every 6 (six) hours as needed for pain.      [name] levothyroxine (SYNTHROID, LEVOTHROID) 125 MCG tablet Take 125 mcg by mouth daily.      . metFORMIN (GLUMETZA) 500 MG (MOD) 24 hr tablet Take 500 mg by mouth daily with breakfast.      . Prenatal Vit-Fe Fumarate-FA (PRENATAL MULTIVITAMIN) TABS Take 1  tablet by mouth daily at 12 noon.        Review of Systems  Constitutional: Negative for fever, chills and malaise/fatigue.  Gastrointestinal: Negative for nausea, vomiting and abdominal pain.  Genitourinary: Negative for dysuria.       Vaginal bleeding   Neurological: Negative for weakness and headaches.   Physical Exam   Blood pressure 118/62, pulse 95, temperature 98 F (36.7 C), temperature source Oral, resp. rate 20, last menstrual period 11/26/2011.  Physical Exam  Constitutional: She is oriented to person, place, and time. She appears well-developed and well-nourished. No distress (but anxious).  HENT:  Head: Normocephalic.  Cardiovascular: Normal rate.   Respiratory: Effort normal.  GI: Soft. She exhibits no distension and no mass. There is no tenderness. There is no rebound and no guarding.  Genitourinary: Uterus normal. Vaginal discharge found.  Small to moderate clotted blood in vault, dark Cervix long and closed  Musculoskeletal: Normal range of motion.  Neurological: She is alert and oriented to person, place, and time.  Skin: Skin is warm and dry.  Psychiatric: She has a normal mood and affect.    MAU Course  Procedures  MDM   Assessment and Plan  A:  SIUP at [redacted]w[redacted]d       Vaginal bleeding in the second  trimester      Probable placental abruption  P:  Discussed with pt and husband who insist on seeing a doctor      Dr Marice Potter came in and reviewed findings and plan with pt and husband      Advised partial rest with pelvic rest.       Followup as scheduled in a few days in MFM  [facility] 05/01/2012, 5:07 PM

## 2012-05-02 ENCOUNTER — Inpatient Hospital Stay (HOSPITAL_COMMUNITY)
Admission: AD | Admit: 2012-05-02 | Discharge: 2012-05-02 | Disposition: A | Discharge status: Home / Self Care | Payer: BC Managed Care – PPO | Source: Ambulatory Visit | Attending: Obstetrics & Gynecology | Admitting: Obstetrics & Gynecology

## 2012-05-02 ENCOUNTER — Encounter (HOSPITAL_COMMUNITY): Payer: Self-pay | Admitting: *Deleted

## 2012-05-02 ENCOUNTER — Telehealth: Payer: Self-pay | Admitting: *Deleted

## 2012-05-02 DIAGNOSIS — O99810 Abnormal glucose complicating pregnancy: Secondary | ICD-10-CM | POA: Insufficient documentation

## 2012-05-02 DIAGNOSIS — O4692 Antepartum hemorrhage, unspecified, second trimester: Secondary | ICD-10-CM

## 2012-05-02 DIAGNOSIS — O4590 Premature separation of placenta, unspecified, unspecified trimester: Secondary | ICD-10-CM | POA: Insufficient documentation

## 2012-05-02 DIAGNOSIS — O4690 Antepartum hemorrhage, unspecified, unspecified trimester: Secondary | ICD-10-CM

## 2012-05-02 DIAGNOSIS — M549 Dorsalgia, unspecified: Secondary | ICD-10-CM | POA: Insufficient documentation

## 2012-05-02 LAB — OBSTETRIC PANEL
Antibody Screen: NEGATIVE
Basophils Absolute: 0 10*3/uL (ref 0.0–0.1)
Basophils Relative: 0 % (ref 0–1)
Eosinophils Absolute: 0.1 10*3/uL (ref 0.0–0.7)
Eosinophils Relative: 1 % (ref 0–5)
HCT: 32 % — ABNORMAL LOW (ref 36.0–46.0)
Hemoglobin: 10.8 g/dL — ABNORMAL LOW (ref 12.0–15.0)
Hepatitis B Surface Ag: NEGATIVE
Lymphocytes Relative: 19 % (ref 12–46)
Lymphs Abs: 2 10*3/uL (ref 0.7–4.0)
MCH: 29.2 pg (ref 26.0–34.0)
MCHC: 33.8 g/dL (ref 30.0–36.0)
MCV: 86.5 fL (ref 78.0–100.0)
Monocytes Absolute: 0.6 10*3/uL (ref 0.1–1.0)
Monocytes Relative: 5 % (ref 3–12)
Neutro Abs: 7.8 10*3/uL — ABNORMAL HIGH (ref 1.7–7.7)
Neutrophils Relative %: 75 % (ref 43–77)
Platelets: 284 10*3/uL (ref 150–400)
RBC: 3.7 MIL/uL — ABNORMAL LOW (ref 3.87–5.11)
RDW: 13.3 % (ref 11.5–15.5)
Rh Type: POSITIVE
Rubella: 6.28 Index — ABNORMAL HIGH (ref <0.90)
WBC: 10.5 10*3/uL (ref 4.0–10.5)

## 2012-05-02 LAB — URINALYSIS, ROUTINE W REFLEX MICROSCOPIC
Bilirubin Urine: NEGATIVE
Bilirubin Urine: NEGATIVE
Glucose, UA: NEGATIVE mg/dL
Glucose, UA: NEGATIVE mg/dL
Ketones, ur: NEGATIVE mg/dL
Ketones, ur: NEGATIVE mg/dL
Leukocytes, UA: NEGATIVE
Nitrite: NEGATIVE
Nitrite: NEGATIVE
Protein, ur: NEGATIVE mg/dL
Protein, ur: NEGATIVE mg/dL
Specific Gravity, Urine: 1.015 (ref 1.005–1.030)
Specific Gravity, Urine: <1.005 — ABNORMAL LOW (ref 1.005–1.030)
Urobilinogen, UA: 0.2 mg/dL (ref 0.0–1.0)
Urobilinogen, UA: 0.2 mg/dL (ref 0.0–1.0)
pH: 6 (ref 5.0–8.0)
pH: 6 (ref 5.0–8.0)

## 2012-05-02 LAB — HIV ANTIBODY (ROUTINE TESTING W REFLEX): HIV: NONREACTIVE

## 2012-05-02 LAB — URINE MICROSCOPIC-ADD ON

## 2012-05-02 LAB — COMPREHENSIVE METABOLIC PANEL
ALT: 8 U/L (ref 0–35)
AST: 11 U/L (ref 0–37)
Albumin: 3.6 g/dL (ref 3.5–5.2)
Alkaline Phosphatase: 46 U/L (ref 39–117)
BUN: 8 mg/dL (ref 6–23)
CO2: 20 mEq/L (ref 19–32)
Calcium: 8.6 mg/dL (ref 8.4–10.5)
Chloride: 103 mEq/L (ref 96–112)
Creat: 0.51 mg/dL (ref 0.50–1.10)
Glucose, Bld: 128 mg/dL — ABNORMAL HIGH (ref 70–99)
Potassium: 4.2 mEq/L (ref 3.5–5.3)
Sodium: 137 mEq/L (ref 135–145)
Total Bilirubin: 0.2 mg/dL — ABNORMAL LOW (ref 0.3–1.2)
Total Protein: 6.6 g/dL (ref 6.0–8.3)

## 2012-05-02 LAB — TSH: TSH: 2.674 u[IU]/mL (ref 0.350–4.500)

## 2012-05-02 LAB — GC/CHLAMYDIA PROBE AMP
CT Probe RNA: NEGATIVE
GC Probe RNA: NEGATIVE

## 2012-05-02 MED ORDER — CYCLOBENZAPRINE HCL 10 MG PO TABS
5.0000 mg | ORAL_TABLET | Freq: Three times a day (TID) | ORAL | Status: DC | PRN
  Administered 2012-05-02: 5 mg via ORAL
  Filled 2012-05-02: qty 1

## 2012-05-02 MED ORDER — CYCLOBENZAPRINE HCL 5 MG PO TABS: 5.0000 mg | ORAL_TABLET | Freq: Three times a day (TID) | ORAL | Status: DC | PRN

## 2012-05-02 MED ORDER — DOCUSATE SODIUM 100 MG PO CAPS: 100.0000 mg | ORAL_CAPSULE | Freq: Two times a day (BID) | ORAL | Status: DC

## 2012-05-02 NOTE — MAU Provider Note (Signed)
History     CSN: [id number]  Arrival date and time: 05/02/12 3244   First Provider Initiated Contact with Patient 05/02/12 1027      Chief Complaint  Patient presents with  . Abdominal Pain  . Back Pain  . Vaginal Bleeding   HPI 37 y/o [id number] at 22.4 here with continued back pain after she began having vaginal bleeding 1 day ago. She was een in the MAU 1 day ago where a marginal placenta abruption was found on [place]. Her bleeding has improved and is now only when she wipes. Her back pain is 8/10, crampy and radiates to her L flank. She says that it is not similar to her previous contractions. It was not helped by tylenol but she thinks it will be helped by flexeril that helped for some back pain a few months ago. She denies vaginal discharge, irritation, and decreased fetal movement. She also feels weak today and would like some IV fluids. She previously saw [facility] but does not see them anymore due to financial issues. She denies fevers, chills, dyspnea, chest pain, dysuria and swelling. She had sweats last night and had a difficult time sleeping because of pain.   OB History   Grav Para Term Preterm Abortions TAB SAB Ect Mult Living   3 2 1 1  0 0 0 0 0 2      Past Medical History  Diagnosis Date  . Hypothyroidism   . Diabetes mellitus without complication   . Infertility, female     Past Surgical History  Procedure Laterality Date  . Thyroidectomy      Family History  Problem Relation Age of Onset  . Heart disease Father   . Stroke Father     History  Substance Use Topics  . Smoking status: Never Smoker   . Smokeless tobacco: Never Used  . Alcohol Use: No    Allergies: No Known Allergies  No prescriptions prior to admission    ROS per HPI Physical Exam   Blood pressure 118/71, pulse 81, temperature 99.3 F (37.4 C), temperature source Oral, resp. rate 18, height 5\' 2"  (1.575 m), weight 78.019 kg (172 lb), last menstrual period 11/26/2011.  Physical  Exam Gen: NAD, alert, cooperative with exam HEENT: NCAT, MMM CV: RRR, good S1/S2, no murmur Resp: CTABL, no wheezes, non-labored Abd: Soft, pregnant abdomen, no tenderness to palpation, R CVA Ext: No edema, warm Neuro: Alert and oriented, No gross deficits GU: Spec exam with scant amount of pink to red blood.  FHT: 145 bpm on doppler   MAU Course  Procedures  Results for orders placed during the hospital encounter of 05/02/12 (from the past 24 hour(s))  URINALYSIS, ROUTINE W REFLEX MICROSCOPIC     Status: Abnormal   Collection Time    05/02/12  9:40 AM      Result Value Range   Color, Urine YELLOW  YELLOW   APPearance CLEAR  CLEAR   Specific Gravity, Urine 1.015  1.005 - 1.030   pH 6.0  5.0 - 8.0   Glucose, UA NEGATIVE  NEGATIVE mg/dL   Hgb urine dipstick LARGE (*) NEGATIVE   Bilirubin Urine NEGATIVE  NEGATIVE   Ketones, ur NEGATIVE  NEGATIVE mg/dL   Protein, ur NEGATIVE  NEGATIVE mg/dL   Urobilinogen, UA 0.2  0.0 - 1.0 mg/dL   Nitrite NEGATIVE  NEGATIVE   Leukocytes, UA SMALL (*) NEGATIVE  URINE MICROSCOPIC-ADD ON     Status: None   Collection Time  05/02/12  9:40 AM      Result Value Range   Squamous Epithelial / LPF RARE  RARE   WBC, UA 3-6  <3 WBC/hpf   RBC / HPF 3-6  <3 RBC/hpf   Bacteria, UA RARE  RARE  URINALYSIS, ROUTINE W REFLEX MICROSCOPIC     Status: Abnormal   Collection Time    05/02/12 11:02 AM      Result Value Range   Color, Urine YELLOW  YELLOW   APPearance CLEAR  CLEAR   Specific Gravity, Urine <1.005 (*) 1.005 - 1.030   pH 6.0  5.0 - 8.0   Glucose, UA NEGATIVE  NEGATIVE mg/dL   Hgb urine dipstick SMALL (*) NEGATIVE   Bilirubin Urine NEGATIVE  NEGATIVE   Ketones, ur NEGATIVE  NEGATIVE mg/dL   Protein, ur NEGATIVE  NEGATIVE mg/dL   Urobilinogen, UA 0.2  0.0 - 1.0 mg/dL   Nitrite NEGATIVE  NEGATIVE   Leukocytes, UA NEGATIVE  NEGATIVE  URINE MICROSCOPIC-ADD ON     Status: None   Collection Time    05/02/12 11:02 AM      Result Value Range    Squamous Epithelial / LPF RARE  RARE   WBC, UA 0-2  <3 WBC/hpf   RBC / HPF 0-2  <3 RBC/hpf     Assessment and Plan  37 y/o [id number] here with back pain and a known marginal abruption diagnosed 1 day ago. - Back pain possibly musculoskeletal in nature, she is not contracting on the [place] and has had good relief with flexeril -  Will send urine for culture, cath UA is only pos for small Hgb  - F/u as scheduled with [place] in 2 days, fetal echo tomorrow, and hosp f/u in the clinic in 2 weeks - Reviewed reasons to return or be concerned, sent Rx for flexeril and colace - Also with T2DM/Class b GDM on metformin and with hypothyroidism on synthroid- continue current doses  Kevin Fenton 05/02/2012, 12:35 PM   I was present for the exam and agree with above. Low suspicion for pyelo or kidney stones.   [facility], CNM 05/02/2012 1:29 PM

## 2012-05-02 NOTE — Telephone Encounter (Signed)
Pt called clinic earlier this morning and spoke w/Antoinette. She reported that she had been seen @ MAU yesterday and now having increased pain and bleeding. She requested call back from nurse. I called pt and she re-stated her complaints- adding that her bleeding is small but her pain is much worse. She requested Rx for cyclobenzaprine which she states she has been given in the past by another provider. Pt then stated that she has come to the hospital (MAU) since we are closed. I clarified that we are not closed, however we did not have any appt availability for her this morning. I advised pt that since she feels her pain has changed and she is already here at the hospital, she should be evaluated @ MAU. Pt voiced understanding.

## 2012-05-02 NOTE — MAU Note (Signed)
Name and DOB verified. Pt confirms the spelling is correct on her IDband.

## 2012-05-02 NOTE — MAU Note (Signed)
Was here yesterday, cramping and bleeding.  States continued to have cramping and back pain, bleeding has also continued.  Took tylenol, it did not help.  Wants rx for flexeril- took it a few months ago for back pain and it helped.  Also thinks might need an IV because she is so weak and tired.

## 2012-05-02 NOTE — MAU Note (Signed)
States she is not worried about the bleeding. "It's not too much. I just can't deal with the pain." Patient very talkative. Laughing.

## 2012-05-03 ENCOUNTER — Other Ambulatory Visit: Payer: Self-pay | Admitting: Obstetrics and Gynecology

## 2012-05-03 ENCOUNTER — Encounter: Payer: Self-pay | Admitting: *Deleted

## 2012-05-03 DIAGNOSIS — O24911 Unspecified diabetes mellitus in pregnancy, first trimester: Secondary | ICD-10-CM

## 2012-05-03 DIAGNOSIS — E119 Type 2 diabetes mellitus without complications: Secondary | ICD-10-CM

## 2012-05-03 DIAGNOSIS — O09529 Supervision of elderly multigravida, unspecified trimester: Secondary | ICD-10-CM

## 2012-05-03 LAB — HEMOGLOBINOPATHY EVALUATION
Hemoglobin Other: 0 %
Hgb A2 Quant: 2.8 % (ref 2.2–3.2)
Hgb A: 97.2 % (ref 96.8–97.8)
Hgb F Quant: 0 % (ref 0.0–2.0)
Hgb S Quant: 0 %

## 2012-05-04 ENCOUNTER — Encounter (HOSPITAL_COMMUNITY): Payer: Self-pay

## 2012-05-04 ENCOUNTER — Ambulatory Visit (HOSPITAL_COMMUNITY)
Admission: RE | Admit: 2012-05-04 | Discharge: 2012-05-04 | Disposition: A | Discharge status: Home / Self Care | Payer: BC Managed Care – PPO | Source: Ambulatory Visit | Attending: Obstetrics and Gynecology | Admitting: Obstetrics and Gynecology

## 2012-05-04 ENCOUNTER — Ambulatory Visit (HOSPITAL_COMMUNITY): Admission: RE | Admit: 2012-05-04 | Payer: BC Managed Care – PPO | Source: Ambulatory Visit

## 2012-05-04 ENCOUNTER — Encounter: Payer: Self-pay | Admitting: Obstetrics and Gynecology

## 2012-05-04 VITALS — BP 103/66 | HR 92 | Wt 176.0 lb

## 2012-05-04 DIAGNOSIS — O09529 Supervision of elderly multigravida, unspecified trimester: Secondary | ICD-10-CM

## 2012-05-04 DIAGNOSIS — E039 Hypothyroidism, unspecified: Secondary | ICD-10-CM | POA: Insufficient documentation

## 2012-05-04 DIAGNOSIS — O99280 Endocrine, nutritional and metabolic diseases complicating pregnancy, unspecified trimester: Secondary | ICD-10-CM | POA: Insufficient documentation

## 2012-05-04 DIAGNOSIS — Z8751 Personal history of pre-term labor: Secondary | ICD-10-CM | POA: Insufficient documentation

## 2012-05-04 DIAGNOSIS — O99810 Abnormal glucose complicating pregnancy: Secondary | ICD-10-CM | POA: Insufficient documentation

## 2012-05-04 DIAGNOSIS — E079 Disorder of thyroid, unspecified: Secondary | ICD-10-CM | POA: Insufficient documentation

## 2012-05-04 DIAGNOSIS — O209 Hemorrhage in early pregnancy, unspecified: Secondary | ICD-10-CM | POA: Insufficient documentation

## 2012-05-04 DIAGNOSIS — E119 Type 2 diabetes mellitus without complications: Secondary | ICD-10-CM

## 2012-05-04 NOTE — Progress Notes (Signed)
Briana Fuentes  was seen today for an ultrasound appointment.  See full report in [facility].  Comments: Briana Fuentes was seen today due to advanced maternal age.  She had a recent ultrasound due to vaginal bleeding that revealed an area along the inferior placenta concerning for a subchorionic bleed. She currently denies any active vaginal bleeding. On ultrasound today, this area of concern is not appreciated.  An anterior placenta is noted with several placental lakes - the placenta otherwise appears normal.  After counseling, the patient declined serum screening, amniocentesis and cell free fetal DNA (see note from Genetics counselor)  Impression: Single IUP at 22 2/7 weeks Normal detailed fetal anatomy No markers associated with aneuploidy appreciated. No ultrasound findings suspicious for subchorionic bleed noted Normal amniotic fluid volume  Recommendations: Recommend follow-up ultrasound examination in 4 weeks for interval growth Recommend fetal echo due to history of pregestataional diabetes  Alpha Gula, MD

## 2012-05-04 NOTE — Progress Notes (Signed)
Genetic Counseling  High-Risk Gestation Note  Appointment Date:  05/04/2012 Referred By: Catalina Antigua, MD Date of Birth:  [birth year]    Pregnancy History: [id number] Estimated Date of Delivery: 09/01/12 Estimated Gestational Age: [redacted]w[redacted]d Attending: Alpha Gula, MD    Briana Fuentes was seen for genetic counseling because of a maternal age of 37 y.o..     She was counseled regarding maternal age and the association with risk for chromosome conditions due to nondisjunction with aging of the ova.   We reviewed chromosomes, nondisjunction, and the associated 1 in 37 risk for fetal aneuploidy at [redacted]w[redacted]d gestation related to a maternal age of 37 y.o. at delivery.  She was counseled that the risk for aneuploidy decreases as gestational age increases, accounting for those pregnancies which spontaneously abort.  We specifically discussed Down syndrome (trisomy 36), trisomies 15 and 67, and sex chromosome aneuploidies (47,XXX and 47,XXY) including the common features and prognoses of each.   We reviewed available screening options including noninvasive prenatal testing (NIPT) and detailed ultrasound. We discussed that screening tests are used to modify a patient's a priori risk for aneuploidy, typically based on age.  This estimate provides a pregnancy specific risk assessment.  Specifically, we discussed that NIPT analyzes cell free fetal DNA found in the maternal circulation. This test is not diagnostic for chromosome conditions, but can provide information regarding the presence or absence of extra fetal DNA for chromosomes 13, 18, 21, X, and Y, and missing fetal DNA for chromosome X and Y (Turner syndrome). Thus, it would not identify or rule out all genetic conditions. The reported detection rate is greater than 99% for Trisomy 21, greater than 98% for Trisomy 18, and is approximately 80% (8 out of 10) for Trisomy 13. The false positive rate is reported to be less than 0.1% for any of these conditions.  In  addition, we discussed that ~50-80% of fetuses with Down syndrome and up to 90-95% of fetuses with trisomy 18/13, when well visualized, have detectable anomalies or soft markers by detailed ultrasound (~18+ weeks gestation).   She was also counseled regarding diagnostic testing via amniocentesis.  We reviewed the approximate 1 in 300-500 risk for complications for amniocentesis, including spontaneous pregnancy loss or spontaneous preterm labor and delivery. We discussed the risks, limitations, and benefits of each screening and testing option. After consideration of all the options, Ms. Briana Fuentes elected to proceed with targeted ultrasound only and declined NIPT and amniocentesis. She indicated that she felt comfortable with her a priori age related risk for aneuploidy and also indicated that if a chromosome condition were to be present in a pregnancy she would not want that information prior to the birth of the baby.  A detailed ultrasound was performed today.  The ultrasound report will be sent under separate cover. She understands that ultrasound cannot rule out all birth defects or genetic syndromes.   Both family histories were reviewed and found to be noncontributory for birth defects, mental retardation, and known genetic conditions. Without further information regarding the provided family history, an accurate genetic risk cannot be calculated. Further genetic counseling is warranted if more information is obtained.  [name] denied exposure to environmental toxins or chemical agents. She denied the use of alcohol, tobacco or street drugs. She denied significant viral illnesses during the course of her pregnancy. Her medical and surgical histories were contributory for diabetes mellitus, for which she is taking metformin and for hypothyroidism, for which she takes levothyroxine. She also reported  a history of bleeding earlier this week, for which she was seen in Maternity Admissions Unit at [facility].    I counseled Ms. Briana Fuentes regarding the above risks and available options.  The approximate face-to-face time with the genetic counselor was 30 minutes.  Briana Plowman, MS,  Certified Genetic Counselor 05/04/2012

## 2012-05-04 NOTE — MAU Provider Note (Signed)

## 2012-05-05 LAB — CULTURE, OB URINE: Colony Count: 80000

## 2012-05-08 ENCOUNTER — Ambulatory Visit: Payer: BC Managed Care – PPO

## 2012-05-15 ENCOUNTER — Encounter: Payer: BC Managed Care – PPO | Admitting: Advanced Practice Midwife

## 2012-05-16 ENCOUNTER — Ambulatory Visit (INDEPENDENT_AMBULATORY_CARE_PROVIDER_SITE_OTHER): Payer: BC Managed Care – PPO

## 2012-05-16 VITALS — BP 124/69 | HR 89 | Temp 97.1°F | Ht 65.0 in | Wt 173.5 lb

## 2012-05-16 DIAGNOSIS — Z8751 Personal history of pre-term labor: Secondary | ICD-10-CM

## 2012-05-16 DIAGNOSIS — O09219 Supervision of pregnancy with history of pre-term labor, unspecified trimester: Secondary | ICD-10-CM

## 2012-05-16 MED ORDER — HYDROXYPROGESTERONE CAPROATE 250 MG/ML IM OIL
250.0000 mg | TOPICAL_OIL | Freq: Once | INTRAMUSCULAR | Status: AC
  Administered 2012-05-16: 250 mg via INTRAMUSCULAR

## 2012-05-22 ENCOUNTER — Ambulatory Visit (INDEPENDENT_AMBULATORY_CARE_PROVIDER_SITE_OTHER): Payer: BC Managed Care – PPO | Admitting: Obstetrics and Gynecology

## 2012-05-22 VITALS — BP 95/58 | Temp 97.0°F | Wt 173.7 lb

## 2012-05-22 DIAGNOSIS — E039 Hypothyroidism, unspecified: Secondary | ICD-10-CM

## 2012-05-22 DIAGNOSIS — O4690 Antepartum hemorrhage, unspecified, unspecified trimester: Secondary | ICD-10-CM | POA: Insufficient documentation

## 2012-05-22 DIAGNOSIS — O24919 Unspecified diabetes mellitus in pregnancy, unspecified trimester: Secondary | ICD-10-CM

## 2012-05-22 DIAGNOSIS — O4692 Antepartum hemorrhage, unspecified, second trimester: Secondary | ICD-10-CM

## 2012-05-22 DIAGNOSIS — O24912 Unspecified diabetes mellitus in pregnancy, second trimester: Secondary | ICD-10-CM

## 2012-05-22 DIAGNOSIS — Z8751 Personal history of pre-term labor: Secondary | ICD-10-CM

## 2012-05-22 MED ORDER — HYDROXYPROGESTERONE CAPROATE 250 MG/ML IM OIL
250.0000 mg | TOPICAL_OIL | Freq: Once | INTRAMUSCULAR | Status: AC
  Administered 2012-05-22: 250 mg via INTRAMUSCULAR

## 2012-05-22 MED ORDER — PRENATAL MULTIVITAMIN CH: 1.0000 | ORAL_TABLET | Freq: Every day | ORAL | Status: DC

## 2012-05-22 MED ORDER — METFORMIN HCL ER (MOD) 500 MG PO TB24: 500.0000 mg | ORAL_TABLET | Freq: Every day | ORAL | Status: DC

## 2012-05-22 NOTE — Progress Notes (Signed)
On Synthroid: stable TSH (nl 3/10). Increased to bid metformin last wk. Forgot book. Not checking fastings States 2 hrs: 140-150. Start qid checks and bring book. See Maggie. Needs fetal echo scheduled. Has not collected 24 hr urine yet >. Bring next. RX PNV and Metformin. Seen for VB 05/01/12, had [facility]. No further bleed. No PTL sx. 17P today.

## 2012-05-22 NOTE — Patient Instructions (Signed)
Pregnancy - Second Trimester The second trimester of pregnancy (3 to 6 months) is a period of rapid growth for you and your baby. At the end of the sixth month, your baby is about 9 inches long and weighs 1 1/2 pounds. You will begin to feel the baby move between 18 and 20 weeks of the pregnancy. This is called quickening. Weight gain is faster. A clear fluid (colostrum) may leak out of your breasts. You may feel small contractions of the womb (uterus). This is known as false labor or Braxton-Hicks contractions. This is like a practice for labor when the baby is ready to be born. Usually, the problems with morning sickness have usually passed by the end of your first trimester. Some women develop small dark blotches (called cholasma, mask of pregnancy) on their face that usually goes away after the baby is born. Exposure to the sun makes the blotches worse. Acne may also develop in some pregnant women and pregnant women who have acne, may find that it goes away. PRENATAL EXAMS  Blood work may continue to be done during prenatal exams. These tests are done to check on your health and the probable health of your baby. Blood work is used to follow your blood levels (hemoglobin). Anemia (low hemoglobin) is common during pregnancy. Iron and vitamins are given to help prevent this. You will also be checked for diabetes between 24 and 28 weeks of the pregnancy. Some of the previous blood tests may be repeated.  The size of the uterus is measured during each visit. This is to make sure that the baby is continuing to grow properly according to the dates of the pregnancy.  Your blood pressure is checked every prenatal visit. This is to make sure you are not getting toxemia.  Your urine is checked to make sure you do not have an infection, diabetes or protein in the urine.  Your weight is checked often to make sure gains are happening at the suggested rate. This is to ensure that both you and your baby are growing  normally.  Sometimes, an ultrasound is performed to confirm the proper growth and development of the baby. This is a test which bounces harmless sound waves off the baby so your caregiver can more accurately determine due dates. Sometimes, a specialized test is done on the amniotic fluid surrounding the baby. This test is called an amniocentesis. The amniotic fluid is obtained by sticking a needle into the belly (abdomen). This is done to check the chromosomes in instances where there is a concern about possible genetic problems with the baby. It is also sometimes done near the end of pregnancy if an early delivery is required. In this case, it is done to help make sure the baby's lungs are mature enough for the baby to live outside of the womb. CHANGES OCCURING IN THE SECOND TRIMESTER OF PREGNANCY Your body goes through many changes during pregnancy. They vary from person to person. Talk to your caregiver about changes you notice that you are concerned about.  During the second trimester, you will likely have an increase in your appetite. It is normal to have cravings for certain foods. This varies from person to person and pregnancy to pregnancy.  Your lower abdomen will begin to bulge.  You may have to urinate more often because the uterus and baby are pressing on your bladder. It is also common to get more bladder infections during pregnancy (pain with urination). You can help this by   drinking lots of fluids and emptying your bladder before and after intercourse.  You may begin to get stretch marks on your hips, abdomen, and breasts. These are normal changes in the body during pregnancy. There are no exercises or medications to take that prevent this change.  You may begin to develop swollen and bulging veins (varicose veins) in your legs. Wearing support hose, elevating your feet for 15 minutes, 3 to 4 times a day and limiting salt in your diet helps lessen the problem.  Heartburn may develop  as the uterus grows and pushes up against the stomach. Antacids recommended by your caregiver helps with this problem. Also, eating smaller meals 4 to 5 times a day helps.  Constipation can be treated with a stool softener or adding bulk to your diet. Drinking lots of fluids, vegetables, fruits, and whole grains are helpful.  Exercising is also helpful. If you have been very active up until your pregnancy, most of these activities can be continued during your pregnancy. If you have been less active, it is helpful to start an exercise program such as walking.  Hemorrhoids (varicose veins in the rectum) may develop at the end of the second trimester. Warm sitz baths and hemorrhoid cream recommended by your caregiver helps hemorrhoid problems.  Backaches may develop during this time of your pregnancy. Avoid heavy lifting, wear low heal shoes and practice good posture to help with backache problems.  Some pregnant women develop tingling and numbness of their hand and fingers because of swelling and tightening of ligaments in the wrist (carpel tunnel syndrome). This goes away after the baby is born.  As your breasts enlarge, you may have to get a bigger bra. Get a comfortable, cotton, support bra. Do not get a nursing bra until the last month of the pregnancy if you will be nursing the baby.  You may get a dark line from your belly button to the pubic area called the linea nigra.  You may develop rosy cheeks because of increase blood flow to the face.  You may develop spider looking lines of the face, neck, arms and chest. These go away after the baby is born. HOME CARE INSTRUCTIONS   It is extremely important to avoid all smoking, herbs, alcohol, and unprescribed drugs during your pregnancy. These chemicals affect the formation and growth of the baby. Avoid these chemicals throughout the pregnancy to ensure the delivery of a healthy infant.  Most of your home care instructions are the same as  suggested for the first trimester of your pregnancy. Keep your caregiver's appointments. Follow your caregiver's instructions regarding medication use, exercise and diet.  During pregnancy, you are providing food for you and your baby. Continue to eat regular, well-balanced meals. Choose foods such as meat, fish, milk and other low fat dairy products, vegetables, fruits, and whole-grain breads and cereals. Your caregiver will tell you of the ideal weight gain.  A physical sexual relationship may be continued up until near the end of pregnancy if there are no other problems. Problems could include early (premature) leaking of amniotic fluid from the membranes, vaginal bleeding, abdominal pain, or other medical or pregnancy problems.  Exercise regularly if there are no restrictions. Check with your caregiver if you are unsure of the safety of some of your exercises. The greatest weight gain will occur in the last 2 trimesters of pregnancy. Exercise will help you:  Control your weight.  Get you in shape for labor and delivery.  Lose weight   after you have the baby.  Wear a good support or jogging bra for breast tenderness during pregnancy. This may help if worn during sleep. Pads or tissues may be used in the bra if you are leaking colostrum.  Do not use hot tubs, steam rooms or saunas throughout the pregnancy.  Wear your seat belt at all times when driving. This protects you and your baby if you are in an accident.  Avoid raw meat, uncooked cheese, cat litter boxes and soil used by cats. These carry germs that can cause birth defects in the baby.  The second trimester is also a good time to visit your dentist for your dental health if this has not been done yet. Getting your teeth cleaned is OK. Use a soft toothbrush. Brush gently during pregnancy.  It is easier to loose urine during pregnancy. Tightening up and strengthening the pelvic muscles will help with this problem. Practice stopping your  urination while you are going to the bathroom. These are the same muscles you need to strengthen. It is also the muscles you would use as if you were trying to stop from passing gas. You can practice tightening these muscles up 10 times a set and repeating this about 3 times per day. Once you know what muscles to tighten up, do not perform these exercises during urination. It is more likely to contribute to an infection by backing up the urine.  Ask for help if you have financial, counseling or nutritional needs during pregnancy. Your caregiver will be able to offer counseling for these needs as well as refer you for other special needs.  Your skin may become oily. If so, wash your face with mild soap, use non-greasy moisturizer and oil or cream based makeup. MEDICATIONS AND DRUG USE IN PREGNANCY  Take prenatal vitamins as directed. The vitamin should contain 1 milligram of folic acid. Keep all vitamins out of reach of children. Only a couple vitamins or tablets containing iron may be fatal to a baby or young child when ingested.  Avoid use of all medications, including herbs, over-the-counter medications, not prescribed or suggested by your caregiver. Only take over-the-counter or prescription medicines for pain, discomfort, or fever as directed by your caregiver. Do not use aspirin.  Let your caregiver also know about herbs you may be using.  Alcohol is related to a number of birth defects. This includes fetal alcohol syndrome. All alcohol, in any form, should be avoided completely. Smoking will cause low birth rate and premature babies.  Street or illegal drugs are very harmful to the baby. They are absolutely forbidden. A baby born to an addicted mother will be addicted at birth. The baby will go through the same withdrawal an adult does. SEEK MEDICAL CARE IF:  You have any concerns or worries during your pregnancy. It is better to call with your questions if you feel they cannot wait, rather  than worry about them. SEEK IMMEDIATE MEDICAL CARE IF:   An unexplained oral temperature above 102 F (38.9 C) develops, or as your caregiver suggests.  You have leaking of fluid from the vagina (birth canal). If leaking membranes are suspected, take your temperature and tell your caregiver of this when you call.  There is vaginal spotting, bleeding, or passing clots. Tell your caregiver of the amount and how many pads are used. Light spotting in pregnancy is common, especially following intercourse.  You develop a bad smelling vaginal discharge with a change in the color from clear   to white.  You continue to feel sick to your stomach (nauseated) and have no relief from remedies suggested. You vomit blood or coffee ground-like materials.  You lose more than 2 pounds of weight or gain more than 2 pounds of weight over 1 week, or as suggested by your caregiver.  You notice swelling of your face, hands, feet, or legs.  You get exposed to German measles and have never had them.  You are exposed to fifth disease or chickenpox.  You develop belly (abdominal) pain. Round ligament discomfort is a common non-cancerous (benign) cause of abdominal pain in pregnancy. Your caregiver still must evaluate you.  You develop a bad headache that does not go away.  You develop fever, diarrhea, pain with urination, or shortness of breath.  You develop visual problems, blurry, or double vision.  You fall or are in a car accident or any kind of trauma.  There is mental or physical violence at home. Document Released: 02/02/2001 Document Revised: 05/03/2011 Document Reviewed: 08/07/2008 Exit[employer] Patient Information 2013 [employer].  

## 2012-05-22 NOTE — Progress Notes (Signed)
Pulse:  Needs new rx for PNV. Has a burning in her feet.

## 2012-05-30 ENCOUNTER — Ambulatory Visit (INDEPENDENT_AMBULATORY_CARE_PROVIDER_SITE_OTHER): Payer: BC Managed Care – PPO | Admitting: *Deleted

## 2012-05-30 VITALS — BP 99/63 | HR 81 | Wt 175.3 lb

## 2012-05-30 DIAGNOSIS — O09299 Supervision of pregnancy with other poor reproductive or obstetric history, unspecified trimester: Secondary | ICD-10-CM

## 2012-05-30 DIAGNOSIS — O09213 Supervision of pregnancy with history of pre-term labor, third trimester: Secondary | ICD-10-CM

## 2012-05-30 MED ORDER — HYDROXYPROGESTERONE CAPROATE 250 MG/ML IM OIL
250.0000 mg | TOPICAL_OIL | Freq: Once | INTRAMUSCULAR | Status: AC
  Administered 2012-05-30: 250 mg via INTRAMUSCULAR

## 2012-05-30 NOTE — Progress Notes (Signed)
17P injection given as ordered. Pt expressed no c/o.

## 2012-06-05 ENCOUNTER — Ambulatory Visit (INDEPENDENT_AMBULATORY_CARE_PROVIDER_SITE_OTHER): Payer: BC Managed Care – PPO | Admitting: Obstetrics and Gynecology

## 2012-06-05 ENCOUNTER — Encounter: Payer: Self-pay | Admitting: Obstetrics and Gynecology

## 2012-06-05 ENCOUNTER — Ambulatory Visit: Payer: BC Managed Care – PPO | Admitting: Obstetrics and Gynecology

## 2012-06-05 VITALS — BP 105/71 | Temp 97.2°F | Wt 174.0 lb

## 2012-06-05 DIAGNOSIS — O09299 Supervision of pregnancy with other poor reproductive or obstetric history, unspecified trimester: Secondary | ICD-10-CM

## 2012-06-05 DIAGNOSIS — E119 Type 2 diabetes mellitus without complications: Secondary | ICD-10-CM

## 2012-06-05 DIAGNOSIS — O09219 Supervision of pregnancy with history of pre-term labor, unspecified trimester: Secondary | ICD-10-CM

## 2012-06-05 DIAGNOSIS — E0590 Thyrotoxicosis, unspecified without thyrotoxic crisis or storm: Secondary | ICD-10-CM

## 2012-06-05 DIAGNOSIS — O09529 Supervision of elderly multigravida, unspecified trimester: Secondary | ICD-10-CM

## 2012-06-05 DIAGNOSIS — O09522 Supervision of elderly multigravida, second trimester: Secondary | ICD-10-CM

## 2012-06-05 DIAGNOSIS — O24919 Unspecified diabetes mellitus in pregnancy, unspecified trimester: Secondary | ICD-10-CM

## 2012-06-05 DIAGNOSIS — O09212 Supervision of pregnancy with history of pre-term labor, second trimester: Secondary | ICD-10-CM

## 2012-06-05 DIAGNOSIS — O24912 Unspecified diabetes mellitus in pregnancy, second trimester: Secondary | ICD-10-CM

## 2012-06-05 DIAGNOSIS — O99891 Other specified diseases and conditions complicating pregnancy: Secondary | ICD-10-CM

## 2012-06-05 LAB — POCT URINALYSIS DIP (DEVICE)
Glucose, UA: NEGATIVE mg/dL
Ketones, ur: NEGATIVE mg/dL
Leukocytes, UA: NEGATIVE
Nitrite: NEGATIVE
Protein, ur: 100 mg/dL — AB
Specific Gravity, Urine: >=1.03 (ref 1.005–1.030)
Urobilinogen, UA: 0.2 mg/dL (ref 0.0–1.0)
pH: 6.5 (ref 5.0–8.0)

## 2012-06-05 MED ORDER — HYDROXYPROGESTERONE CAPROATE 250 MG/ML IM OIL
250.0000 mg | TOPICAL_OIL | Freq: Once | INTRAMUSCULAR | Status: AC
  Administered 2012-06-05: 250 mg via INTRAMUSCULAR

## 2012-06-05 NOTE — Progress Notes (Signed)
Patient doing well without any complaints. Did not bring CBG log. Patient was suppose to take metformin twice daily but has only been taking it once daily. Patient informed that new prescription is once daily as it is good for 24 hr. Patient also reports checking CBG every other day. Informed of importance of checking CBG regularly and adhering to diet. Patient to bring log book at next visit. Patient did not bring 24 hr urine collection. Patient instructed to do so by next visit. Patient scheduled for fetal echo today. Continue weekly 17-P

## 2012-06-05 NOTE — Addendum Note (Signed)
Addended by: Faythe Casa on: 06/05/2012 11:29 AM   Modules accepted: Orders

## 2012-06-05 NOTE — Progress Notes (Signed)
Pulse 84 C/o of burning sensation in feet.

## 2012-06-06 ENCOUNTER — Other Ambulatory Visit: Payer: Self-pay | Admitting: Obstetrics and Gynecology

## 2012-06-12 ENCOUNTER — Ambulatory Visit (INDEPENDENT_AMBULATORY_CARE_PROVIDER_SITE_OTHER): Payer: BC Managed Care – PPO | Admitting: General Practice

## 2012-06-12 VITALS — BP 99/63 | HR 83 | Temp 97.2°F | Ht 65.0 in | Wt 174.1 lb

## 2012-06-12 DIAGNOSIS — O09213 Supervision of pregnancy with history of pre-term labor, third trimester: Secondary | ICD-10-CM

## 2012-06-12 DIAGNOSIS — O09219 Supervision of pregnancy with history of pre-term labor, unspecified trimester: Secondary | ICD-10-CM

## 2012-06-12 MED ORDER — HYDROXYPROGESTERONE CAPROATE 250 MG/ML IM OIL
250.0000 mg | TOPICAL_OIL | INTRAMUSCULAR | Status: DC
  Administered 2012-06-12 – 2012-07-24 (×6): 250 mg via INTRAMUSCULAR

## 2012-06-19 ENCOUNTER — Ambulatory Visit (INDEPENDENT_AMBULATORY_CARE_PROVIDER_SITE_OTHER): Payer: BC Managed Care – PPO | Admitting: Obstetrics and Gynecology

## 2012-06-19 VITALS — BP 106/62 | Temp 97.5°F | Wt 173.3 lb

## 2012-06-19 DIAGNOSIS — O09529 Supervision of elderly multigravida, unspecified trimester: Secondary | ICD-10-CM

## 2012-06-19 DIAGNOSIS — O24419 Gestational diabetes mellitus in pregnancy, unspecified control: Secondary | ICD-10-CM

## 2012-06-19 DIAGNOSIS — O99810 Abnormal glucose complicating pregnancy: Secondary | ICD-10-CM

## 2012-06-19 DIAGNOSIS — O09523 Supervision of elderly multigravida, third trimester: Secondary | ICD-10-CM

## 2012-06-19 DIAGNOSIS — O09299 Supervision of pregnancy with other poor reproductive or obstetric history, unspecified trimester: Secondary | ICD-10-CM

## 2012-06-19 DIAGNOSIS — O09213 Supervision of pregnancy with history of pre-term labor, third trimester: Secondary | ICD-10-CM

## 2012-06-19 MED ORDER — ZOLPIDEM TARTRATE ER 12.5 MG PO TBCR: 12.5000 mg | EXTENDED_RELEASE_TABLET | Freq: Every evening | ORAL | Status: DC | PRN

## 2012-06-19 NOTE — Progress Notes (Signed)
Patient c/o not being able to sleep for 3-4 days

## 2012-06-19 NOTE — Patient Instructions (Signed)

## 2012-06-19 NOTE — Progress Notes (Signed)
Checks CBGs only twice a day and states had 170 yesterday, others 118-140 but does not have book or meter. On Metformin 500mg  q hs. Start 2/d and check 4x/d, bring book and meter. Concerned sleeps poorly and no sleep x 3 nights> Info on general measures and Ambien  #5 tabs NR. If needed.

## 2012-06-26 ENCOUNTER — Ambulatory Visit (INDEPENDENT_AMBULATORY_CARE_PROVIDER_SITE_OTHER): Payer: BC Managed Care – PPO | Admitting: General Practice

## 2012-06-26 VITALS — BP 94/57 | HR 74 | Temp 97.6°F | Ht 65.0 in | Wt 175.8 lb

## 2012-06-26 DIAGNOSIS — O09219 Supervision of pregnancy with history of pre-term labor, unspecified trimester: Secondary | ICD-10-CM

## 2012-06-26 DIAGNOSIS — O09299 Supervision of pregnancy with other poor reproductive or obstetric history, unspecified trimester: Secondary | ICD-10-CM

## 2012-06-26 DIAGNOSIS — O09213 Supervision of pregnancy with history of pre-term labor, third trimester: Secondary | ICD-10-CM

## 2012-06-26 NOTE — Progress Notes (Signed)
Patient could not see Seward Grater today, had to go work so she couldn't stay. Will make a note on next week's appt to see [facility]

## 2012-06-27 ENCOUNTER — Telehealth: Payer: Self-pay | Admitting: *Deleted

## 2012-06-27 NOTE — Telephone Encounter (Addendum)
Pt left message stating that she has been having back pain x 2 days. She wants to know what she can do. I returned pt's call and discussed her concern. She states that she has been having intermittent back pain for several days- denies abdominal tightening or cramping. Pt was advised she may use a heating pad to her back (on low setting) and to take Tylenol per package directions. Pt has next clinic appt on 07/03/12. She may go to [facility] sooner if pain becomes severe or if sx of labor occur. Pt voiced understanding.

## 2012-07-03 ENCOUNTER — Ambulatory Visit (INDEPENDENT_AMBULATORY_CARE_PROVIDER_SITE_OTHER): Payer: BC Managed Care – PPO | Admitting: Obstetrics & Gynecology

## 2012-07-03 ENCOUNTER — Encounter: Payer: Self-pay | Admitting: Obstetrics and Gynecology

## 2012-07-03 VITALS — BP 96/64 | Temp 97.0°F | Wt 176.4 lb

## 2012-07-03 DIAGNOSIS — O24919 Unspecified diabetes mellitus in pregnancy, unspecified trimester: Secondary | ICD-10-CM

## 2012-07-03 DIAGNOSIS — O09219 Supervision of pregnancy with history of pre-term labor, unspecified trimester: Secondary | ICD-10-CM

## 2012-07-03 DIAGNOSIS — O24913 Unspecified diabetes mellitus in pregnancy, third trimester: Secondary | ICD-10-CM

## 2012-07-03 MED ORDER — GLYBURIDE 2.5 MG PO TABS: 2.5000 mg | ORAL_TABLET | Freq: Two times a day (BID) | ORAL | Status: DC

## 2012-07-03 NOTE — Progress Notes (Signed)
FBS 93-120, PP 110-176, on metformin 500 mg XR daily.

## 2012-07-03 NOTE — Patient Instructions (Signed)
Preterm Birth Preterm birth is a birth which happens before 37 weeks of pregnancy. Most pregnancies last about 39 to 41 weeks. Every week in the womb is important and is beneficial to the health of the infant. Infants born before 37 weeks of pregnancy are at a higher risk for complications. Depending on when the infant was born, he or she may be:  Late preterm. Born between 32 and 37 weeks of pregnancy.  Very preterm. Born at less than 32 weeks of pregnancy.  Extremely preterm. Born at less than 25 weeks of pregnancy. The earlier a baby is born, the more likely the child will have issues related to prematurity. Complications and problems that can be seen in infants born too early include:  Problems breathing (respiratory distress syndrome).  Low birth weight.  Problems feeding.  Sleeping problems.  Yellowing of the skin (jaundice).  Infections such as pneumonia. Babies that are born very preterm or extremely preterm are at risk for more serious medical issues. These include:  Breathing issues.  Eyesight issues.  Brain development issues (intraventricular hemorrhage).  Behavioral and emotional development issues.  Growth and developmental delays.  Cerebral palsy.  Serious feeding or bowel complications (necrotizing enterocolitis). CAUSES  There are 2 broad categories of preterm birth.  Spontaneous preterm birth. This is a birth resulting from preterm labor (not medically induced) or preterm premature rupture of membranes (PPROM).  Indicated preterm birth. This is a birth resulting from labor being medically induced due to health, personal, or social reasons. Preterm birth may be related to certain medical conditions, lifestyle factors, or demographic factors encountered by the mother or fetus.   Medical conditions include:  Multiple gestations (twins, triplets, and so on).  Infection.  Diabetes.  Heart disease.  Kidney disease.  Cervical or uterine  abnormalities.  Being underweight.  High blood pressure or preeclampsia.  Premature rupture of membranes (PROM).  Birth defects in the fetus.  Lifestyle factors include:  Poor prenatal care.  Poor nutrition or anemia.  Cigarette smoking.  Consuming alcohol.  High levels of stress and lack of social or emotional support.  Exposure to chemical or environmental toxins.  Substance abuse.  Demographic factors include:  African-American ethnicity.  Age (younger than 18 or older than 35).  Low socioeconomic status. Women with a history of preterm labor or who become pregnant within 18 months of giving birth are also at increased risk for preterm birth. DIAGNOSIS  Your caregiver may request additional tests to diagnose underlying complications resulting from preterm birth. Tests on the infant may include:  Physical exam.  Blood tests.  Chest X-rays.  Heart-lung monitoring. PREVENTION There are some things you can do to help lower your risk of having a preterm infant in the future. These include:  Good prenatal care throughout the entire pregnancy. See a caregiver regularly for advice and tests.  Management of underlying medical conditions.  Proper self-care and lifestyle changes.  Proper diet and weight control.  Watching for signs of various infections. TREATMENT  After birth, special care will be taken to assess any problems or complications of the infant. Supportive care will be provided for the infant. Treatment depends on what problems are present and any complications that develop. Some preterm infants are cared for in a neonatal intensive care unit. In general, care may include:  Maintaining temperature and oxygen in a clear heated box (baby isolette).  Monitoring the infant's heart rate, breathing, and level of oxygen in the blood.  Monitoring for signs of   infection and, if needed, giving intravenous (IV) antibiotic medicine.  Inserting a feeding tube  (nose, mouth) or giving IV nutrition if unable to feed.  Inserting a breathing tube (ventilation).  Respiration support (continuous positive airway pressure [CPAP] or oxygen). Treatment will change as the infant builds up strength and is able to breathe and eat on his or her own. For some infants, no special treatment is necessary. Parents may be educated on the potential health risks of prematurity to the infant. HOME CARE INSTRUCTIONS  Understand your infant's special conditions and needs. It may be reassuring to learn about infant CPR.  Monitor your infant in the car seat until he or she grows and matures. Infant car seats can cause breathing difficulties for preterm infants.  Keep your infant warm. Dress your infant in layers and keep him or her away from drafts, especially in cold months of the year.  Wash your hands thoroughly after going to the bathroom or changing a diaper. Late preterm infants may be more prone to infection.  Follow all your caregiver's instructions for providing support and care to your preterm infant.  Get support from organizations and groups that understand your challenges.  Follow up with your infant's caregiver as directed. SEEK MEDICAL CARE IF:  Your infant has feeding difficulties.  Your infant has sleeping difficulties.  Your infant has breathing difficulties.  Your infant's skin starts to look yellow (jaundice).  Your infant shows signs of infection like a stuffy nose, fever, crying, or bluish color of the skin. FOR MORE INFORMATION March [employer]: www.marchofdimes.com Prematurity.org: www.prematurity.org Document Released: 05/01/2003 Document Revised: 05/03/2011 Document Reviewed: 07/03/2009 [employer] Patient Information 2013 [employer].  

## 2012-07-10 ENCOUNTER — Encounter: Payer: BC Managed Care – PPO | Attending: Family Medicine | Admitting: Dietician

## 2012-07-10 ENCOUNTER — Encounter: Payer: Self-pay | Admitting: Family Medicine

## 2012-07-10 ENCOUNTER — Ambulatory Visit (INDEPENDENT_AMBULATORY_CARE_PROVIDER_SITE_OTHER): Payer: BC Managed Care – PPO | Admitting: Family Medicine

## 2012-07-10 VITALS — BP 104/57 | Temp 97.2°F | Wt 172.8 lb

## 2012-07-10 DIAGNOSIS — E0590 Thyrotoxicosis, unspecified without thyrotoxic crisis or storm: Secondary | ICD-10-CM

## 2012-07-10 DIAGNOSIS — O24913 Unspecified diabetes mellitus in pregnancy, third trimester: Secondary | ICD-10-CM

## 2012-07-10 DIAGNOSIS — Z713 Dietary counseling and surveillance: Secondary | ICD-10-CM | POA: Insufficient documentation

## 2012-07-10 DIAGNOSIS — E890 Postprocedural hypothyroidism: Secondary | ICD-10-CM

## 2012-07-10 DIAGNOSIS — O99283 Endocrine, nutritional and metabolic diseases complicating pregnancy, third trimester: Secondary | ICD-10-CM

## 2012-07-10 DIAGNOSIS — O24919 Unspecified diabetes mellitus in pregnancy, unspecified trimester: Secondary | ICD-10-CM

## 2012-07-10 DIAGNOSIS — O99810 Abnormal glucose complicating pregnancy: Secondary | ICD-10-CM | POA: Insufficient documentation

## 2012-07-10 DIAGNOSIS — O09219 Supervision of pregnancy with history of pre-term labor, unspecified trimester: Secondary | ICD-10-CM

## 2012-07-10 DIAGNOSIS — O99891 Other specified diseases and conditions complicating pregnancy: Secondary | ICD-10-CM

## 2012-07-10 MED ORDER — GLYBURIDE 2.5 MG PO TABS: 2.5000 mg | ORAL_TABLET | Freq: Two times a day (BID) | ORAL | Status: DC

## 2012-07-10 NOTE — Progress Notes (Signed)
Nutrition note: 1st visit consult Pt has Type 2 DM Pt has gained 5.8# @ [redacted]w[redacted]d, which is < expected. Pt reports eating 2 meals & 2 snacks/d. Pt reports she has decreased amount of food & sweets she has been eating lately. Pt reports she is scared to eat too much because of her DM, which could explain her poor wt gain. Pt is taking PNV. Pt reports some nausea with glyburide but no vomiting or heartburn. NKFA Pt is concerned about BF while taking glyburide. Pt received verbal & written education on DM during pregnancy. Discussed importance of eating correct amount of CHO with a protein source at each meal & snack. Encouraged pt to discuss BF while taking glyburide with a lactation consultant in the hospital. Discussed wt gain goals of 15-25# or 0.6#/wk. Pt agrees to follow DM diet with 3 meals & 3 snacks and proper CHO/ protein combination. Pt does not have WIC and plans to try to BF. F/u in 2-4 wks Blondell Reveal, MS, RD, LDN

## 2012-07-10 NOTE — Progress Notes (Signed)
Fetal Echo results requested again from Dr. Casilda Carls office. They weill refax them again.(they faxed them last week) ROI signed for [facility].Lab results from Dr. Talmage Nap (endocrinologist) requested and received. U/S scheduled with [facility] on 07/19/12 at 1 pm. Opthamology exam with Dr. Nelle Don at [facility] scheduled on 07/19/12 at 945 am.

## 2012-07-10 NOTE — Progress Notes (Signed)
Needs to start 2x/wk testing-did not start today because she could not stay.  Will start later this week. To see Diabetes this week.  Not taking glyburide secondary to lows, but not eating. BS on meter today [id number]-will switch to 1/2 tab at HS Discussed at length. Needs ECHO report and optho report.

## 2012-07-10 NOTE — Progress Notes (Signed)
Diabetes Education: Seen for the first time today with a history of DM for 3 months prior to pregnancy.  Has a meter and has been monitoring, but not following the GDM schedule.  2 hr pp have been at 175, 185 in the evening, [place] at 119, 128, 98.  Post starting Glyburide was at 90-95 mg in the evening.  Not consistent with meals or snacks.  Had episode of "Low" which was at 90 mg and has not been taking the Glyburide as ordered.  Requested that she see the RD and try to follow the dietary recommendations, eat regular meals, check glucose levels, keep a record of her meals and snacks and take her glyburide.  She is to eat dinner about 8:00 PM rather than at 12-12:30 in the evening.  Will follow-up at next visit.  Maggie May, RN, RD, CDE

## 2012-07-10 NOTE — Progress Notes (Signed)
Pulse- 76 Patient states she takes the glyburide only once a day and that it makes her feel like throwing up after taking the medication;  Patient states she is afraid to eat because of her diabetes therefore does not eat very much- could benefit from diabetic teaching/nutrition

## 2012-07-11 ENCOUNTER — Encounter: Payer: Self-pay | Admitting: *Deleted

## 2012-07-13 ENCOUNTER — Encounter: Payer: Self-pay | Admitting: *Deleted

## 2012-07-13 DIAGNOSIS — O24913 Unspecified diabetes mellitus in pregnancy, third trimester: Secondary | ICD-10-CM

## 2012-07-14 ENCOUNTER — Other Ambulatory Visit: Payer: BC Managed Care – PPO

## 2012-07-18 ENCOUNTER — Ambulatory Visit (INDEPENDENT_AMBULATORY_CARE_PROVIDER_SITE_OTHER): Payer: BC Managed Care – PPO | Admitting: *Deleted

## 2012-07-18 VITALS — Wt 173.7 lb

## 2012-07-18 DIAGNOSIS — O09219 Supervision of pregnancy with history of pre-term labor, unspecified trimester: Secondary | ICD-10-CM

## 2012-07-18 DIAGNOSIS — O09299 Supervision of pregnancy with other poor reproductive or obstetric history, unspecified trimester: Secondary | ICD-10-CM

## 2012-07-18 MED ORDER — HYDROXYPROGESTERONE CAPROATE 250 MG/ML IM OIL
250.0000 mg | TOPICAL_OIL | Freq: Once | INTRAMUSCULAR | Status: AC
  Administered 2012-07-18: 250 mg via INTRAMUSCULAR

## 2012-07-19 ENCOUNTER — Ambulatory Visit (HOSPITAL_COMMUNITY)
Admission: RE | Admit: 2012-07-19 | Discharge: 2012-07-19 | Disposition: A | Discharge status: Home / Self Care | Payer: BC Managed Care – PPO | Source: Ambulatory Visit | Attending: Family Medicine | Admitting: Family Medicine

## 2012-07-19 VITALS — BP 100/59 | HR 82 | Wt 176.5 lb

## 2012-07-19 DIAGNOSIS — O09529 Supervision of elderly multigravida, unspecified trimester: Secondary | ICD-10-CM | POA: Insufficient documentation

## 2012-07-19 DIAGNOSIS — E119 Type 2 diabetes mellitus without complications: Secondary | ICD-10-CM

## 2012-07-19 DIAGNOSIS — O09523 Supervision of elderly multigravida, third trimester: Secondary | ICD-10-CM

## 2012-07-19 DIAGNOSIS — O99280 Endocrine, nutritional and metabolic diseases complicating pregnancy, unspecified trimester: Secondary | ICD-10-CM | POA: Insufficient documentation

## 2012-07-19 DIAGNOSIS — E079 Disorder of thyroid, unspecified: Secondary | ICD-10-CM | POA: Insufficient documentation

## 2012-07-19 DIAGNOSIS — E0590 Thyrotoxicosis, unspecified without thyrotoxic crisis or storm: Secondary | ICD-10-CM

## 2012-07-19 DIAGNOSIS — E039 Hypothyroidism, unspecified: Secondary | ICD-10-CM | POA: Insufficient documentation

## 2012-07-19 DIAGNOSIS — O24919 Unspecified diabetes mellitus in pregnancy, unspecified trimester: Secondary | ICD-10-CM | POA: Insufficient documentation

## 2012-07-19 NOTE — Progress Notes (Addendum)
[facility]  Indication: 37 yr old [id number] at [redacted]w[redacted]d with type II diabetes, previous preterm delivery, and hypothyroidism (Grave's disease s/p surgery) for fetal growth.  Findings: 1. Single intrauterine pregnancy. 2. Estimated fetal weight is in the 48th%. 3. Anterior placenta without evidence of previa. 4. Normal amniotic fluid index. 5. The limited anatomy survey is normal.  Recommendations: 1. Appropriate fetal growth. 2. Type II diabetes: - recommend strict glucose control - recommend fetal growth every 4 weeks - recommend antenatal testing- patient reports she has not started yet because couldn't come to "so many appointments" ; may do weekly BPPs if easier for patient; discussed importance of antenatal testing with the patient; patient agrees to start antenatal testing and will call primary OB for an appointment - had normal fetal echocardiogram - recommend delivery by estimated due date but not prior to 39 weeks in the absence of other complications 3. Hypothyroid: - on synthroid - recommend follow TSH at least once a trimester 4. Previous preterm delivery: - on 17OH progesterone - recommend preterm labor precautions 5. Advanced maternal age: - previously counseled; declined aneuploidy screening/testing  Eulis Foster, MD

## 2012-07-24 ENCOUNTER — Ambulatory Visit (INDEPENDENT_AMBULATORY_CARE_PROVIDER_SITE_OTHER): Payer: BC Managed Care – PPO | Admitting: Family Medicine

## 2012-07-24 VITALS — BP 102/65 | Wt 175.7 lb

## 2012-07-24 DIAGNOSIS — O24919 Unspecified diabetes mellitus in pregnancy, unspecified trimester: Secondary | ICD-10-CM

## 2012-07-24 DIAGNOSIS — O24913 Unspecified diabetes mellitus in pregnancy, third trimester: Secondary | ICD-10-CM

## 2012-07-24 NOTE — Progress Notes (Signed)
Pt states she did not keep appt on 5/23 for NST because she "has too many appts".  I explained importance of close fetal surveillance due to her diabetes.

## 2012-07-24 NOTE — Progress Notes (Signed)
Pulse- 80 

## 2012-07-24 NOTE — Progress Notes (Signed)
NST reviewed and reactive. Normal ECHO U/S last week with MFM revealed appropriate growth 46% and nml fluid Has to skip NST later this week, kick counts reviewed.

## 2012-07-24 NOTE — Patient Instructions (Addendum)
Pregnancy - Third Trimester The third trimester of pregnancy (the last 3 months) is a period of the most rapid growth for you and your baby. The baby approaches a length of 20 inches and a weight of 6 to 10 pounds. The baby is adding on fat and getting ready for life outside your body. While inside, babies have periods of sleeping and waking, sucking thumbs, and hiccuping. You can often feel small contractions of the uterus. This is false labor. It is also called Braxton-Hicks contractions. This is like a practice for labor. The usual problems in this stage of pregnancy include more difficulty breathing, swelling of the hands and feet from water retention, and having to urinate more often because of the uterus and baby pressing on your bladder.  PRENATAL EXAMS  Blood work may continue to be done during prenatal exams. These tests are done to check on your health and the probable health of your baby. Blood work is used to follow your blood levels (hemoglobin). Anemia (low hemoglobin) is common during pregnancy. Iron and vitamins are given to help prevent this. You may also continue to be checked for diabetes. Some of the past blood tests may be done again.  The size of the uterus is measured during each visit. This makes sure your baby is growing properly according to your pregnancy dates.  Your blood pressure is checked every prenatal visit. This is to make sure you are not getting toxemia.  Your urine is checked every prenatal visit for infection, diabetes, and protein.  Your weight is checked at each visit. This is done to make sure gains are happening at the suggested rate and that you and your baby are growing normally.  Sometimes, an ultrasound is performed to confirm the position and the proper growth and development of the baby. This is a test done that bounces harmless sound waves off the baby so your caregiver can more accurately determine a due date.  Discuss the type of pain medicine and  anesthesia you will have during your labor and delivery.  Discuss the possibility and anesthesia if a cesarean section might be necessary.  Inform your caregiver if there is any mental or physical violence at home. Sometimes, a specialized non-stress test, contraction stress test, and biophysical profile are done to make sure the baby is not having a problem. Checking the amniotic fluid surrounding the baby is called an amniocentesis. The amniotic fluid is removed by sticking a needle into the belly (abdomen). This is sometimes done near the end of pregnancy if an early delivery is required. In this case, it is done to help make sure the baby's lungs are mature enough for the baby to live outside of the womb. If the lungs are not mature and it is unsafe to deliver the baby, an injection of cortisone medicine is given to the mother 1 to 2 days before the delivery. This helps the baby's lungs mature and makes it safer to deliver the baby. CHANGES OCCURING IN THE THIRD TRIMESTER OF PREGNANCY Your body goes through many changes during pregnancy. They vary from person to person. Talk to your caregiver about changes you notice and are concerned about.  During the last trimester, you have probably had an increase in your appetite. It is normal to have cravings for certain foods. This varies from person to person and pregnancy to pregnancy.  You may begin to get stretch marks on your hips, abdomen, and breasts. These are normal changes in the body   during pregnancy. There are no exercises or medicines to take which prevent this change.  Constipation may be treated with a stool softener or adding bulk to your diet. Drinking lots of fluids, fiber in vegetables, fruits, and whole grains are helpful.  Exercising is also helpful. If you have been very active up until your pregnancy, most of these activities can be continued during your pregnancy. If you have been less active, it is helpful to start an exercise  program such as walking. Consult your caregiver before starting exercise programs.  Avoid all smoking, alcohol, non-prescribed drugs, herbs and "street drugs" during your pregnancy. These chemicals affect the formation and growth of the baby. Avoid chemicals throughout the pregnancy to ensure the delivery of a healthy infant.  Backache, varicose veins, and hemorrhoids may develop or get worse.  You will tire more easily in the third trimester, which is normal.  The baby's movements may be stronger and more often.  You may become short of breath easily.  Your belly button may stick out.  A yellow discharge may leak from your breasts called colostrum.  You may have a bloody mucus discharge. This usually occurs a few days to a week before labor begins. HOME CARE INSTRUCTIONS   Keep your caregiver's appointments. Follow your caregiver's instructions regarding medicine use, exercise, and diet.  During pregnancy, you are providing food for you and your baby. Continue to eat regular, well-balanced meals. Choose foods such as meat, fish, milk and other low fat dairy products, vegetables, fruits, and whole-grain breads and cereals. Your caregiver will tell you of the ideal weight gain.  A physical sexual relationship may be continued throughout pregnancy if there are no other problems such as early (premature) leaking of amniotic fluid from the membranes, vaginal bleeding, or belly (abdominal) pain.  Exercise regularly if there are no restrictions. Check with your caregiver if you are unsure of the safety of your exercises. Greater weight gain will occur in the last 2 trimesters of pregnancy. Exercising helps:  Control your weight.  Get you in shape for labor and delivery.  You lose weight after you deliver.  Rest a lot with legs elevated, or as needed for leg cramps or low back pain.  Wear a good support or jogging bra for breast tenderness during pregnancy. This may help if worn during  sleep. Pads or tissues may be used in the bra if you are leaking colostrum.  Do not use hot tubs, steam rooms, or saunas.  Wear your seat belt when driving. This protects you and your baby if you are in an accident.  Avoid raw meat, cat litter boxes and soil used by cats. These carry germs that can cause birth defects in the baby.  It is easier to leak urine during pregnancy. Tightening up and strengthening the pelvic muscles will help with this problem. You can practice stopping your urination while you are going to the bathroom. These are the same muscles you need to strengthen. It is also the muscles you would use if you were trying to stop from passing gas. You can practice tightening these muscles up 10 times a set and repeating this about 3 times per day. Once you know what muscles to tighten up, do not perform these exercises during urination. It is more likely to cause an infection by backing up the urine.  Ask for help if you have financial, counseling, or nutritional needs during pregnancy. Your caregiver will be able to offer counseling for these   needs as well as refer you for other special needs.  Make a list of emergency phone numbers and have them available.  Plan on getting help from family or friends when you go home from the hospital.  Make a trial run to the hospital.  Take prenatal classes with the father to understand, practice, and ask questions about the labor and delivery.  Prepare the baby's room or nursery.  Do not travel out of the city unless it is absolutely necessary and with the advice of your caregiver.  Wear only low or no heal shoes to have better balance and prevent falling. MEDICINES AND DRUG USE IN PREGNANCY  Take prenatal vitamins as directed. The vitamin should contain 1 milligram of folic acid. Keep all vitamins out of reach of children. Only a couple vitamins or tablets containing iron may be fatal to a baby or young child when ingested.  Avoid use  of all medicines, including herbs, over-the-counter medicines, not prescribed or suggested by your caregiver. Only take over-the-counter or prescription medicines for pain, discomfort, or fever as directed by your caregiver. Do not use aspirin, ibuprofen or naproxen unless approved by your caregiver.  Let your caregiver also know about herbs you may be using.  Alcohol is related to a number of birth defects. This includes fetal alcohol syndrome. All alcohol, in any form, should be avoided completely. Smoking will cause low birth rate and premature babies.  Illegal drugs are very harmful to the baby. They are absolutely forbidden. A baby born to an addicted mother will be addicted at birth. The baby will go through the same withdrawal an adult does. SEEK MEDICAL CARE IF: You have any concerns or worries during your pregnancy. It is better to call with your questions if you feel they cannot wait, rather than worry about them. SEEK IMMEDIATE MEDICAL CARE IF:   An unexplained oral temperature above 102 F (38.9 C) develops, or as your caregiver suggests.  You have leaking of fluid from the vagina. If leaking membranes are suspected, take your temperature and tell your caregiver of this when you call.  There is vaginal spotting, bleeding or passing clots. Tell your caregiver of the amount and how many pads are used.  You develop a bad smelling vaginal discharge with a change in the color from clear to white.  You develop vomiting that lasts more than 24 hours.  You develop chills or fever.  You develop shortness of breath.  You develop burning on urination.  You loose more than 2 pounds of weight or gain more than 2 pounds of weight or as suggested by your caregiver.  You notice sudden swelling of your face, hands, and feet or legs.  You develop belly (abdominal) pain. Round ligament discomfort is a common non-cancerous (benign) cause of abdominal pain in pregnancy. Your caregiver still  must evaluate you.  You develop a severe headache that does not go away.  You develop visual problems, blurred or double vision.  If you have not felt your baby move for more than 1 hour. If you think the baby is not moving as much as usual, eat something with sugar in it and lie down on your left side for an hour. The baby should move at least 4 to 5 times per hour. Call right away if your baby moves less than that.  You fall, are in a car accident, or any kind of trauma.  There is mental or physical violence at home. Document Released: 02/02/2001   Document Revised: 11/03/2011 Document Reviewed: 08/07/2008 [employer] Patient Information 2014 [employer]  Breastfeeding A change in hormones during your pregnancy causes growth of your breast tissue and an increase in number and size of milk ducts. The hormone prolactin allows proteins, sugars, and fats from your blood supply to make breast milk in your milk-producing glands. The hormone progesterone prevents breast milk from being released before the birth of your baby. After the birth of your baby, your progesterone level decreases allowing breast milk to be released. Thoughts of your baby, as well as his or her sucking or crying, can stimulate the release of milk from the milk-producing glands. Deciding to breastfeed (nurse) is one of the best choices you can make for you and your baby. The information that follows gives a brief review of the benefits, as well as other important skills to know about breastfeeding. BENEFITS OF BREASTFEEDING For your baby  The first milk (colostrum) helps your baby's digestive system function better.   There are antibodies in your milk that help your baby fight off infections.   Your baby has a lower incidence of asthma, allergies, and sudden infant death syndrome (SIDS).   The nutrients in breast milk are better for your baby than infant formulas.  Breast milk improves your baby's brain development.    Your baby will have less gas, colic, and constipation.  Your baby is less likely to develop other conditions, such as childhood obesity, asthma, or diabetes mellitus. For you  Breastfeeding helps develop a very special bond between you and your baby.   Breastfeeding is convenient, always available at the correct temperature, and costs nothing.   Breastfeeding helps to burn calories and helps you lose the weight gained during pregnancy.   Breastfeeding makes your uterus contract back down to normal size faster and slows bleeding following delivery.   Breastfeeding mothers have a lower risk of developing osteoporosis or breast or ovarian cancer later in life.  BREASTFEEDING FREQUENCY  A healthy, full-term baby may breastfeed as often as every hour or space his or her feedings to every 3 hours. Breastfeeding frequency will vary from baby to baby.   Newborns should be fed no less than every 2 3 hours during the day and every 4 5 hours during the night. You should breastfeed a minimum of 8 feedings in a 24 hour period.  Awaken your baby to breastfeed if it has been 3 4 hours since the last feeding.  Breastfeed when you feel the need to reduce the fullness of your breasts or when your newborn shows signs of hunger. Signs that your baby may be hungry include:  Increased alertness or activity.  Stretching.  Movement of the head from side to side.  Movement of the head and opening of the mouth when the corner of the mouth or cheek is stroked (rooting).  Increased sucking sounds, smacking lips, cooing, sighing, or squeaking.  Hand-to-mouth movements.  Increased sucking of fingers or hands.  Fussing.  Intermittent crying.  Signs of extreme hunger will require calming and consoling before you try to feed your baby. Signs of extreme hunger may include:  Restlessness.  A loud, strong cry.  Screaming.  Frequent feeding will help you make more milk and will help prevent  problems, such as sore nipples and engorgement of the breasts.  BREASTFEEDING   Whether lying down or sitting, be sure that the baby's abdomen is facing your abdomen.   Support your breast with 4 fingers under your breast   and your thumb above your nipple. Make sure your fingers are well away from your nipple and your baby's mouth.   Stroke your baby's lips gently with your finger or nipple.   When your baby's mouth is open wide enough, place all of your nipple and as much of the colored area around your nipple (areola) as possible into your baby's mouth.  More areola should be visible above his or her upper lip than below his or her lower lip.  Your baby's tongue should be between his or her lower gum and your breast.  Ensure that your baby's mouth is correctly positioned around the nipple (latched). Your baby's lips should create a seal on your breast.  Signs that your baby has effectively latched onto your nipple include:  Tugging or sucking without pain.  Swallowing heard between sucks.  Absent click or smacking sound.  Muscle movement above and in front of his or her ears with sucking.  Your baby must suck about 2 3 minutes in order to get your milk. Allow your baby to feed on each breast as long as he or she wants. Nurse your baby until he or she unlatches or falls asleep at the first breast, then offer the second breast.  Signs that your baby is full and satisfied include:  A gradual decrease in the number of sucks or complete cessation of sucking.  Falling asleep.  Extension or relaxation of his or her body.  Retention of a small amount of milk in his or her mouth.  Letting go of your breast by himself or herself.  Signs of effective breastfeeding in you include:  Breasts that have increased firmness, weight, and size prior to feeding.  Breasts that are softer after nursing.  Increased milk volume, as well as a change in milk consistency and color by the 5th  day of breastfeeding.  Breast fullness relieved by breastfeeding.  Nipples are not sore, cracked, or bleeding.  If needed, break the suction by putting your finger into the corner of your baby's mouth and sliding your finger between his or her gums. Then, remove your breast from his or her mouth.  It is common for babies to spit up a small amount after a feeding.  Babies often swallow air during feeding. This can make babies fussy. Burping your baby between breasts can help with this.  Vitamin D supplements are recommended for babies who get only breast milk.  Avoid using a pacifier during your baby's first 4 6 weeks.  Avoid supplemental feedings of water, formula, or juice in place of breastfeeding. Breast milk is all the food your baby needs. It is not necessary for your baby to have water or formula. Your breasts will make more milk if supplemental feedings are avoided during the early weeks. HOW TO TELL WHETHER YOUR BABY IS GETTING ENOUGH BREAST MILK Wondering whether or not your baby is getting enough milk is a common concern among mothers. You can be assured that your baby is getting enough milk if:   Your baby is actively sucking and you hear swallowing.   Your baby seems relaxed and satisfied after a feeding.   Your baby nurses at least 8 12 times in a 24 hour time period.  During the first 3 5 days of age:  Your baby is wetting at least 3 5 diapers in a 24 hour period. The urine should be clear and pale yellow.  Your baby is having at least 3 4 stools in   a 24 hour period. The stool should be soft and yellow.  At 5 7 days of age, your baby is having at least 3 6 stools in a 24 hour period. The stool should be seedy and yellow by 5 days of age.  Your baby has a weight loss less than 7 10% during the first 3 days of age.  Your baby does not lose weight after 3 7 days of age.  Your baby gains 4 7 ounces each week after he or she is 4 days of age.  Your baby gains weight  by 5 days of age and is back to birth weight within 2 weeks. ENGORGEMENT In the first week after your baby is born, you may experience extremely full breasts (engorgement). When engorged, your breasts may feel heavy, warm, or tender to the touch. Engorgement peaks within 24 48 hours after delivery of your baby.  Engorgement may be reduced by:  Continuing to breastfeed.  Increasing the frequency of breastfeeding.  Taking warm showers or applying warm, moist heat to your breasts just before each feeding. This increases circulation and helps the milk flow.   Gently massaging your breast before and during the feedings. With your fingertips, massage from your chest wall towards your nipple in a circular motion.   Ensuring that your baby empties at least one breast at every feeding. It also helps to start the next feeding on the opposite breast.   Expressing breast milk by hand or by using a breast pump to empty the breasts if your baby is sleepy, or not nursing well. You may also want to express milk if you are returning to work oryou feel you are getting engorged.  Ensuring your baby is latched on and positioned properly while breastfeeding. If you follow these suggestions, your engorgement should improve in 24 48 hours. If you are still experiencing difficulty, call your lactation consultant or caregiver.  CARING FOR YOURSELF Take care of your breasts.  Bathe or shower daily.   Avoid using soap on your nipples.   Wear a supportive bra. Avoid wearing underwire style bras.  Air dry your nipples for a 3 4minutes after each feeding.   Use only cotton bra pads to absorb breast milk leakage. Leaking of breast milk between feedings is normal.   Use only pure lanolin on your nipples after nursing. You do not need to wash it off before feeding your baby again. Another option is to express a few drops of breast milk and gently massage that milk into your nipples.  Continue breast  self-awareness checks. Take care of yourself.  Eat healthy foods. Alternate 3 meals with 3 snacks.  Avoid foods that you notice affect your baby in a bad way.  Drink milk, fruit juice, and water to satisfy your thirst (about 8 glasses a day).   Rest often, relax, and take your prenatal vitamins to prevent fatigue, stress, and anemia.  Avoid chewing and smoking tobacco.  Avoid alcohol and drug use.  Take over-the-counter and prescribed medicine only as directed by your caregiver or pharmacist. You should always check with your caregiver or pharmacist before taking any new medicine, vitamin, or herbal supplement.  Know that pregnancy is possible while breastfeeding. If desired, talk to your caregiver about family planning and safe birth control methods that may be used while breastfeeding. SEEK MEDICAL CARE IF:   You feel like you want to stop breastfeeding or have become frustrated with breastfeeding.  You have painful breasts or nipples.    Your nipples are cracked or bleeding.  Your breasts are red, tender, or warm.  You have a swollen area on either breast.  You have a fever or chills.  You have nausea or vomiting.  You have drainage from your nipples.  Your breasts do not become full before feedings by the 5th day after delivery.  You feel sad and depressed.  Your baby is too sleepy to eat well.  Your baby is having trouble sleeping.   Your baby is wetting less than 3 diapers in a 24 hour period.  Your baby has less than 3 stools in a 24 hour period.  Your baby's skin or the white part of his or her eyes becomes more yellow.   Your baby is not gaining weight by 51 days of age. MAKE SURE YOU:   Understand these instructions.  Will watch your condition.  Will get help right away if you are not doing well or get worse. Document Released: 02/08/2005 Document Revised: 11/03/2011 Document Reviewed: 09/15/2011 [facility] Patient Information 2014 [place],  [place]. Fetal Movement Counts Patient Name: __________________________________________________ Patient Due Date: ____________________ Performing a fetal movement count is highly recommended in high-risk pregnancies, but it is good for every pregnant woman to do. Your caregiver may ask you to start counting fetal movements at 28 weeks of the pregnancy. Fetal movements often increase:  After eating a full meal.  After physical activity.  After eating or drinking something sweet or cold.  At rest. Pay attention to when you feel the baby is most active. This will help you notice a pattern of your baby's sleep and wake cycles and what factors contribute to an increase in fetal movement. It is important to perform a fetal movement count at the same time each day when your baby is normally most active.  HOW TO COUNT FETAL MOVEMENTS 1. Find a quiet and comfortable area to sit or lie down on your left side. Lying on your left side provides the best blood and oxygen circulation to your baby. 2. Write down the day and time on a sheet of paper or in a journal. 3. Start counting kicks, flutters, swishes, rolls, or jabs in a 2 hour period. You should feel at least 10 movements within 2 hours. 4. If you do not feel 10 movements in 2 hours, wait 2 3 hours and count again. Look for a change in the pattern or not enough counts in 2 hours. SEEK MEDICAL CARE IF:  You feel less than 10 counts in 2 hours, tried twice.  There is no movement in over an hour.  The pattern is changing or taking longer each day to reach 10 counts in 2 hours.  You feel the baby is not moving as he or she usually does. Date: ____________ Movements: ____________ Start time: ____________ Doreatha Martin time: ____________  Date: ____________ Movements: ____________ Start time: ____________ Doreatha Martin time: ____________ Date: ____________ Movements: ____________ Start time: ____________ Doreatha Martin time: ____________ Date: ____________ Movements:  ____________ Start time: ____________ Doreatha Martin time: ____________ Date: ____________ Movements: ____________ Start time: ____________ Doreatha Martin time: ____________ Date: ____________ Movements: ____________ Start time: ____________ Doreatha Martin time: ____________ Date: ____________ Movements: ____________ Start time: ____________ Doreatha Martin time: ____________ Date: ____________ Movements: ____________ Start time: ____________ Doreatha Martin time: ____________  Date: ____________ Movements: ____________ Start time: ____________ Doreatha Martin time: ____________ Date: ____________ Movements: ____________ Start time: ____________ Doreatha Martin time: ____________ Date: ____________ Movements: ____________ Start time: ____________ Doreatha Martin time: ____________ Date: ____________ Movements: ____________ Start time: ____________ Doreatha Martin time: ____________ Date:  ____________ Movements: ____________ Start time: ____________ Doreatha Martin time: ____________ Date: ____________ Movements: ____________ Start time: ____________ Doreatha Martin time: ____________ Date: ____________ Movements: ____________ Start time: ____________ Doreatha Martin time: ____________  Date: ____________ Movements: ____________ Start time: ____________ Doreatha Martin time: ____________ Date: ____________ Movements: ____________ Start time: ____________ Doreatha Martin time: ____________ Date: ____________ Movements: ____________ Start time: ____________ Doreatha Martin time: ____________ Date: ____________ Movements: ____________ Start time: ____________ Doreatha Martin time: ____________ Date: ____________ Movements: ____________ Start time: ____________ Doreatha Martin time: ____________ Date: ____________ Movements: ____________ Start time: ____________ Doreatha Martin time: ____________ Date: ____________ Movements: ____________ Start time: ____________ Doreatha Martin time: ____________  Date: ____________ Movements: ____________ Start time: ____________ Doreatha Martin time: ____________ Date: ____________ Movements: ____________ Start time: ____________ Doreatha Martin  time: ____________ Date: ____________ Movements: ____________ Start time: ____________ Doreatha Martin time: ____________ Date: ____________ Movements: ____________ Start time: ____________ Doreatha Martin time: ____________ Date: ____________ Movements: ____________ Start time: ____________ Doreatha Martin time: ____________ Date: ____________ Movements: ____________ Start time: ____________ Doreatha Martin time: ____________ Date: ____________ Movements: ____________ Start time: ____________ Doreatha Martin time: ____________  Date: ____________ Movements: ____________ Start time: ____________ Doreatha Martin time: ____________ Date: ____________ Movements: ____________ Start time: ____________ Doreatha Martin time: ____________ Date: ____________ Movements: ____________ Start time: ____________ Doreatha Martin time: ____________ Date: ____________ Movements: ____________ Start time: ____________ Doreatha Martin time: ____________ Date: ____________ Movements: ____________ Start time: ____________ Doreatha Martin time: ____________ Date: ____________ Movements: ____________ Start time: ____________ Doreatha Martin time: ____________ Date: ____________ Movements: ____________ Start time: ____________ Doreatha Martin time: ____________  Date: ____________ Movements: ____________ Start time: ____________ Doreatha Martin time: ____________ Date: ____________ Movements: ____________ Start time: ____________ Doreatha Martin time: ____________ Date: ____________ Movements: ____________ Start time: ____________ Doreatha Martin time: ____________ Date: ____________ Movements: ____________ Start time: ____________ Doreatha Martin time: ____________ Date: ____________ Movements: ____________ Start time: ____________ Doreatha Martin time: ____________ Date: ____________ Movements: ____________ Start time: ____________ Doreatha Martin time: ____________ Date: ____________ Movements: ____________ Start time: ____________ Doreatha Martin time: ____________  Date: ____________ Movements: ____________ Start time: ____________ Doreatha Martin time: ____________ Date: ____________  Movements: ____________ Start time: ____________ Doreatha Martin time: ____________ Date: ____________ Movements: ____________ Start time: ____________ Doreatha Martin time: ____________ Date: ____________ Movements: ____________ Start time: ____________ Doreatha Martin time: ____________ Date: ____________ Movements: ____________ Start time: ____________ Doreatha Martin time: ____________ Date: ____________ Movements: ____________ Start time: ____________ Doreatha Martin time: ____________ Date: ____________ Movements: ____________ Start time: ____________ Doreatha Martin time: ____________  Date: ____________ Movements: ____________ Start time: ____________ Doreatha Martin time: ____________ Date: ____________ Movements: ____________ Start time: ____________ Doreatha Martin time: ____________ Date: ____________ Movements: ____________ Start time: ____________ Doreatha Martin time: ____________ Date: ____________ Movements: ____________ Start time: ____________ Doreatha Martin time: ____________ Date: ____________ Movements: ____________ Start time: ____________ Doreatha Martin time: ____________ Date: ____________ Movements: ____________ Start time: ____________ Doreatha Martin time: ____________ Document Released: 03/10/2006 Document Revised: 01/26/2012 Document Reviewed: 12/06/2011 [employer] Patient Information 2014 [employer].

## 2012-07-31 ENCOUNTER — Ambulatory Visit (INDEPENDENT_AMBULATORY_CARE_PROVIDER_SITE_OTHER): Payer: BC Managed Care – PPO | Admitting: Obstetrics & Gynecology

## 2012-07-31 VITALS — BP 108/63 | Wt 177.0 lb

## 2012-07-31 DIAGNOSIS — O24913 Unspecified diabetes mellitus in pregnancy, third trimester: Secondary | ICD-10-CM

## 2012-07-31 DIAGNOSIS — O24919 Unspecified diabetes mellitus in pregnancy, unspecified trimester: Secondary | ICD-10-CM

## 2012-07-31 DIAGNOSIS — O09213 Supervision of pregnancy with history of pre-term labor, third trimester: Secondary | ICD-10-CM

## 2012-07-31 DIAGNOSIS — O09219 Supervision of pregnancy with history of pre-term labor, unspecified trimester: Secondary | ICD-10-CM

## 2012-07-31 LAB — POCT URINALYSIS DIP (DEVICE)
Bilirubin Urine: NEGATIVE
Glucose, UA: NEGATIVE mg/dL
Ketones, ur: NEGATIVE mg/dL
Nitrite: NEGATIVE
Protein, ur: NEGATIVE mg/dL
Specific Gravity, Urine: 1.025 (ref 1.005–1.030)
Urobilinogen, UA: 0.2 mg/dL (ref 0.0–1.0)
pH: 6.5 (ref 5.0–8.0)

## 2012-07-31 NOTE — Progress Notes (Signed)
Occasional pelvic pressure, no LOF, bleeding. NST today reactive. States BG doing well.

## 2012-07-31 NOTE — Progress Notes (Signed)
P = 79   Pt reports pelvic pressure.

## 2012-07-31 NOTE — Patient Instructions (Signed)

## 2012-08-07 ENCOUNTER — Ambulatory Visit (INDEPENDENT_AMBULATORY_CARE_PROVIDER_SITE_OTHER): Payer: BC Managed Care – PPO | Admitting: Family Medicine

## 2012-08-07 VITALS — BP 100/65 | Temp 97.4°F | Wt 178.6 lb

## 2012-08-07 DIAGNOSIS — O24913 Unspecified diabetes mellitus in pregnancy, third trimester: Secondary | ICD-10-CM

## 2012-08-07 DIAGNOSIS — O24919 Unspecified diabetes mellitus in pregnancy, unspecified trimester: Secondary | ICD-10-CM

## 2012-08-07 LAB — OB RESULTS CONSOLE GC/CHLAMYDIA
Chlamydia: NEGATIVE
Gonorrhea: NEGATIVE

## 2012-08-07 LAB — OB RESULTS CONSOLE GBS: GBS: NEGATIVE

## 2012-08-07 MED ORDER — HYDROXYPROGESTERONE CAPROATE 250 MG/ML IM OIL
250.0000 mg | TOPICAL_OIL | Freq: Once | INTRAMUSCULAR | Status: AC
  Administered 2012-08-07: 250 mg via INTRAMUSCULAR

## 2012-08-07 NOTE — Patient Instructions (Addendum)
Gestational Diabetes Mellitus Gestational diabetes mellitus, often simply referred to as gestational diabetes, is a type of diabetes that some women develop during pregnancy. In gestational diabetes, the pancreas does not make enough insulin (a hormone), the cells are less responsive to the insulin that is made (insulin resistance), or both.Normally, insulin moves sugars from food into the tissue cells. The tissue cells use the sugars for energy. The lack of insulin or the lack of normal response to insulin causes excess sugars to build up in the blood instead of going into the tissue cells. As a result, high blood sugar (hyperglycemia) develops. The effect of high sugar (glucose) levels can cause many complications.  RISK FACTORS You have an increased chance of developing gestational diabetes if you have a family history of diabetes and also have one or more of the following risk factors:  A body mass index over 30 (obesity).  A previous pregnancy with gestational diabetes.  An older age at the time of pregnancy. If blood glucose levels are kept in the normal range during pregnancy, women can have a healthy pregnancy. If your blood glucose levels are not well controlled, there may be risks to you, your unborn baby (fetus), your labor and delivery, or your newborn baby.  SYMPTOMS  If symptoms are experienced, they are much like symptoms you would normally expect during pregnancy. The symptoms of gestational diabetes include:   Increased thirst (polydipsia).  Increased urination (polyuria).  Increased urination during the night (nocturia).  Weight loss. This weight loss may be rapid.  Frequent, recurring infections.  Tiredness (fatigue).  Weakness.  Vision changes, such as blurred vision.  Fruity smell to your breath.  Abdominal pain. DIAGNOSIS Diabetes is diagnosed when blood glucose levels are increased. Your blood glucose level may be checked by one or more of the following  blood tests:  A fasting blood glucose test. You will not be allowed to eat for at least 8 hours before a blood sample is taken.  A random blood glucose test. Your blood glucose is checked at any time of the day regardless of when you ate.  A hemoglobin A1c blood glucose test. A hemoglobin A1c test provides information about blood glucose control over the previous 3 months.  An oral glucose tolerance test (OGTT). Your blood glucose is measured after you have not eaten (fasted) for 1 3 hours and then after you drink a glucose-containing beverage. Since the hormones that cause insulin resistance are highest at about 24 28 weeks of a pregnancy, an OGTT is usually performed during that time. If you have risk factors for gestational diabetes, your caregiver may test you for gestational diabetes earlier than 24 weeks of pregnancy. TREATMENT   You will need to take diabetes medicine or insulin daily to keep blood glucose levels in the desired range.  You will need to match insulin dosing with exercise and healthy food choices. The treatment goal is to maintain the before meal (preprandial), bedtime, and overnight blood glucose level at 60 99 mg/dL during pregnancy. The treatment goal is to further maintain peak after meal blood sugar (postprandial glucose) level at 100 140 mg/dL.  HOME CARE INSTRUCTIONS   Have your hemoglobin A1c level checked twice a year.  Perform daily blood glucose monitoring as directed by your caregiver. It is common to perform frequent blood glucose monitoring.  Monitor urine ketones when you are ill and as directed by your caregiver.  Take your diabetes medicine and insulin as directed by your caregiver to   maintain your blood glucose level in the desired range.  Never run out of diabetes medicine or insulin. It is needed every day.  Adjust insulin based on your intake of carbohydrates. Carbohydrates can raise blood glucose levels but need to be included in your diet.  Carbohydrates provide vitamins, minerals, and fiber which are an essential part of a healthy diet. Carbohydrates are found in fruits, vegetables, whole grains, dairy products, legumes, and foods containing added sugars.    Eat healthy foods. Alternate 3 meals with 3 snacks.  Maintain a healthy weight gain. The usual total expected weight gain varies according to your prepregnancy body mass index (BMI).  Carry a medical alert card or wear your medical alert jewelry.  Carry a 15 gram carbohydrate snack with you at all times to treat low blood glucose (hypoglycemia). Some examples of 15 gram carbohydrate snacks include:  Glucose tablets, 3 or 4   Glucose gel, 15 gram tube  Raisins, 2 tablespoons (24 g)  Jelly beans, 6  Animal crackers, 8  Fruit juice, regular soda, or low fat milk, 4 ounces (120 mL)  Gummy treats, 9    Recognize hypoglycemia. Hypoglycemia during pregnancy occurs with blood glucose levels of 60 mg/dL and below. The risk for hypoglycemia increases when fasting or skipping meals, during or after intense exercise, and during sleep. Hypoglycemia symptoms can include:  Tremors or shakes.  Decreased ability to concentrate.  Sweating.  Increased heart rate.  Headache.  Dry mouth.  Hunger.  Irritability.  Anxiety.  Restless sleep.  Altered speech or coordination.  Confusion.  Treat hypoglycemia promptly. If you are alert and able to safely swallow, follow the 15:15 rule:  Take 15 20 grams of rapid-acting glucose or carbohydrate. Rapid-acting options include glucose gel, glucose tablets, or 4 ounces (120 mL) of fruit juice, regular soda, or low fat milk.  Check your blood glucose level 15 minutes after taking the glucose.   Take 15 20 grams more of glucose if the repeat blood glucose level is still 70 mg/dL or below.  Eat a meal or snack within 1 hour once blood glucose levels return to normal.  Be alert to polyuria and polydipsia which are early  signs of hyperglycemia. An early awareness of hyperglycemia allows for prompt treatment. Treat hyperglycemia as directed by your caregiver.  Engage in at least 30 minutes of physical activity a day or as directed by your caregiver. Ten minutes of physical activity timed 30 minutes after each meal is encouraged to control postprandial blood glucose levels.  Adjust your insulin dosing and food intake as needed if you start a new exercise or sport.  Follow your sick day plan at any time you are unable to eat or drink as usual.  Avoid tobacco and alcohol use.  Follow up with your caregiver regularly.  Follow the advice of your caregiver regarding your prenatal and post-delivery (postpartum) appointments, meal planning, exercise, medicines, vitamins, blood tests, other medical tests, and physical activities.  Perform daily skin and foot care. Examine your skin and feet daily for cuts, bruises, redness, nail problems, bleeding, blisters, or sores.  Brush your teeth and gums at least twice a day and floss at least once a day. Follow up with your dentist regularly.  Schedule an eye exam during the first trimester of your pregnancy or as directed by your caregiver.  Share your diabetes management plan with your workplace or school.  Stay up-to-date with immunizations.  Learn to manage stress.  Obtain ongoing diabetes education   and support as needed. SEEK MEDICAL CARE IF:   You are unable to eat food or drink fluids for more than 6 hours.  You have nausea and vomiting for more than 6 hours.  You have a blood glucose level of 200 mg/dL and you have ketones in your urine.  There is a change in mental status.  You develop vision problems.  You have a persistent headache.  You have upper abdominal pain or discomfort.  You develop an additional serious illness.  You have diarrhea for more than 6 hours.  You have been sick or have had a fever for a couple of days and are not getting  better. SEEK IMMEDIATE MEDICAL CARE IF:   You have difficulty breathing.  You no longer feel the baby moving.  You are bleeding or have discharge from your vagina.  You start having premature contractions or labor. MAKE SURE YOU:  Understand these instructions.  Will watch your condition.  Will get help right away if you are not doing well or get worse. Document Released: 05/17/2000 Document Revised: 11/03/2011 Document Reviewed: 09/07/2011 [facility] Patient Information 2014 [place], [place].  Breastfeeding A change in hormones during your pregnancy causes growth of your breast tissue and an increase in number and size of milk ducts. The hormone prolactin allows proteins, sugars, and fats from your blood supply to make breast milk in your milk-producing glands. The hormone progesterone prevents breast milk from being released before the birth of your baby. After the birth of your baby, your progesterone level decreases allowing breast milk to be released. Thoughts of your baby, as well as his or her sucking or crying, can stimulate the release of milk from the milk-producing glands. Deciding to breastfeed (nurse) is one of the best choices you can make for you and your baby. The information that follows gives a brief review of the benefits, as well as other important skills to know about breastfeeding. BENEFITS OF BREASTFEEDING For your baby  The first milk (colostrum) helps your baby's digestive system function better.   There are antibodies in your milk that help your baby fight off infections.   Your baby has a lower incidence of asthma, allergies, and sudden infant death syndrome (SIDS).   The nutrients in breast milk are better for your baby than infant formulas.  Breast milk improves your baby's brain development.   Your baby will have less gas, colic, and constipation.  Your baby is less likely to develop other conditions, such as childhood obesity, asthma, or diabetes  mellitus. For you  Breastfeeding helps develop a very special bond between you and your baby.   Breastfeeding is convenient, always available at the correct temperature, and costs nothing.   Breastfeeding helps to burn calories and helps you lose the weight gained during pregnancy.   Breastfeeding makes your uterus contract back down to normal size faster and slows bleeding following delivery.   Breastfeeding mothers have a lower risk of developing osteoporosis or breast or ovarian cancer later in life.  BREASTFEEDING FREQUENCY  A healthy, full-term baby may breastfeed as often as every hour or space his or her feedings to every 3 hours. Breastfeeding frequency will vary from baby to baby.   Newborns should be fed no less than every 2 3 hours during the day and every 4 5 hours during the night. You should breastfeed a minimum of 8 feedings in a 24 hour period.  Awaken your baby to breastfeed if it has been 3 4  hours since the last feeding.  Breastfeed when you feel the need to reduce the fullness of your breasts or when your newborn shows signs of hunger. Signs that your baby may be hungry include:  Increased alertness or activity.  Stretching.  Movement of the head from side to side.  Movement of the head and opening of the mouth when the corner of the mouth or cheek is stroked (rooting).  Increased sucking sounds, smacking lips, cooing, sighing, or squeaking.  Hand-to-mouth movements.  Increased sucking of fingers or hands.  Fussing.  Intermittent crying.  Signs of extreme hunger will require calming and consoling before you try to feed your baby. Signs of extreme hunger may include:  Restlessness.  A loud, strong cry.  Screaming.  Frequent feeding will help you make more milk and will help prevent problems, such as sore nipples and engorgement of the breasts.  BREASTFEEDING   Whether lying down or sitting, be sure that the baby's abdomen is facing your  abdomen.   Support your breast with 4 fingers under your breast and your thumb above your nipple. Make sure your fingers are well away from your nipple and your baby's mouth.   Stroke your baby's lips gently with your finger or nipple.   When your baby's mouth is open wide enough, place all of your nipple and as much of the colored area around your nipple (areola) as possible into your baby's mouth.  More areola should be visible above his or her upper lip than below his or her lower lip.  Your baby's tongue should be between his or her lower gum and your breast.  Ensure that your baby's mouth is correctly positioned around the nipple (latched). Your baby's lips should create a seal on your breast.  Signs that your baby has effectively latched onto your nipple include:  Tugging or sucking without pain.  Swallowing heard between sucks.  Absent click or smacking sound.  Muscle movement above and in front of his or her ears with sucking.  Your baby must suck about 2 3 minutes in order to get your milk. Allow your baby to feed on each breast as long as he or she wants. Nurse your baby until he or she unlatches or falls asleep at the first breast, then offer the second breast.  Signs that your baby is full and satisfied include:  A gradual decrease in the number of sucks or complete cessation of sucking.  Falling asleep.  Extension or relaxation of his or her body.  Retention of a small amount of milk in his or her mouth.  Letting go of your breast by himself or herself.  Signs of effective breastfeeding in you include:  Breasts that have increased firmness, weight, and size prior to feeding.  Breasts that are softer after nursing.  Increased milk volume, as well as a change in milk consistency and color by the 5th day of breastfeeding.  Breast fullness relieved by breastfeeding.  Nipples are not sore, cracked, or bleeding.  If needed, break the suction by putting your  finger into the corner of your baby's mouth and sliding your finger between his or her gums. Then, remove your breast from his or her mouth.  It is common for babies to spit up a small amount after a feeding.  Babies often swallow air during feeding. This can make babies fussy. Burping your baby between breasts can help with this.  Vitamin D supplements are recommended for babies who get only  breast milk.  Avoid using a pacifier during your baby's first 4 6 weeks.  Avoid supplemental feedings of water, formula, or juice in place of breastfeeding. Breast milk is all the food your baby needs. It is not necessary for your baby to have water or formula. Your breasts will make more milk if supplemental feedings are avoided during the early weeks. HOW TO TELL WHETHER YOUR BABY IS GETTING ENOUGH BREAST MILK Wondering whether or not your baby is getting enough milk is a common concern among mothers. You can be assured that your baby is getting enough milk if:   Your baby is actively sucking and you hear swallowing.   Your baby seems relaxed and satisfied after a feeding.   Your baby nurses at least 8 12 times in a 24 hour time period.  During the first 1 65 days of age:  Your baby is wetting at least 3 5 diapers in a 24 hour period. The urine should be clear and pale yellow.  Your baby is having at least 3 4 stools in a 24 hour period. The stool should be soft and yellow.  At 42 26 days of age, your baby is having at least 3 6 stools in a 24 hour period. The stool should be seedy and yellow by 64 days of age.  Your baby has a weight loss less than 7 10% during the first 43 days of age.  Your baby does not lose weight after 49 44 days of age.  Your baby gains 4 7 ounces each week after he or she is 37 days of age.  Your baby gains weight by 34 days of age and is back to birth weight within 2 weeks. ENGORGEMENT In the first week after your baby is born, you may experience extremely full breasts  (engorgement). When engorged, your breasts may feel heavy, warm, or tender to the touch. Engorgement peaks within 24 48 hours after delivery of your baby.  Engorgement may be reduced by:  Continuing to breastfeed.  Increasing the frequency of breastfeeding.  Taking warm showers or applying warm, moist heat to your breasts just before each feeding. This increases circulation and helps the milk flow.   Gently massaging your breast before and during the feedings. With your fingertips, massage from your chest wall towards your nipple in a circular motion.   Ensuring that your baby empties at least one breast at every feeding. It also helps to start the next feeding on the opposite breast.   Expressing breast milk by hand or by using a breast pump to empty the breasts if your baby is sleepy, or not nursing well. You may also want to express milk if you are returning to work oryou feel you are getting engorged.  Ensuring your baby is latched on and positioned properly while breastfeeding. If you follow these suggestions, your engorgement should improve in 24 48 hours. If you are still experiencing difficulty, call your lactation consultant or caregiver.  CARING FOR YOURSELF Take care of your breasts.  Bathe or shower daily.   Avoid using soap on your nipples.   Wear a supportive bra. Avoid wearing underwire style bras.  Air dry your nipples for a 3 after each feeding.   Use only cotton bra pads to absorb breast milk leakage. Leaking of breast milk between feedings is normal.   Use only pure lanolin on your nipples after nursing. You do not need to wash it off before feeding your baby again.  Another option is to express a few drops of breast milk and gently massage that milk into your nipples.  Continue breast self-awareness checks. Take care of yourself.  Eat healthy foods. Alternate 3 meals with 3 snacks.  Avoid foods that you notice affect your baby in a bad  way.  Drink milk, fruit juice, and water to satisfy your thirst (about 8 glasses a day).   Rest often, relax, and take your prenatal vitamins to prevent fatigue, stress, and anemia.  Avoid chewing and smoking tobacco.  Avoid alcohol and drug use.  Take over-the-counter and prescribed medicine only as directed by your caregiver or pharmacist. You should always check with your caregiver or pharmacist before taking any new medicine, vitamin, or herbal supplement.  Know that pregnancy is possible while breastfeeding. If desired, talk to your caregiver about family planning and safe birth control methods that may be used while breastfeeding. SEEK MEDICAL CARE IF:   You feel like you want to stop breastfeeding or have become frustrated with breastfeeding.  You have painful breasts or nipples.  Your nipples are cracked or bleeding.  Your breasts are red, tender, or warm.  You have a swollen area on either breast.  You have a fever or chills.  You have nausea or vomiting.  You have drainage from your nipples.  Your breasts do not become full before feedings by the 5th day after delivery.  You feel sad and depressed.  Your baby is too sleepy to eat well.  Your baby is having trouble sleeping.   Your baby is wetting less than 3 diapers in a 24 hour period.  Your baby has less than 3 stools in a 24 hour period.  Your baby's skin or the white part of his or her eyes becomes more yellow.   Your baby is not gaining weight by 1 days of age. MAKE SURE YOU:   Understand these instructions.  Will watch your condition.  Will get help right away if you are not doing well or get worse. Document Released: 02/08/2005 Document Revised: 11/03/2011 Document Reviewed: 09/15/2011 [facility] Patient Information 2014 [place], [place].

## 2012-08-07 NOTE — Progress Notes (Signed)
NST reviewed and reactive. Reports good BS control.  One abnl value 179 after party. No book or meter today Cultures today Last 17P today.

## 2012-08-07 NOTE — Progress Notes (Signed)
Pt states that she was told at her last visit that she only needed weekly fetal testing.

## 2012-08-07 NOTE — Progress Notes (Signed)
Pulse: 77

## 2012-08-08 LAB — GC/CHLAMYDIA PROBE AMP
CT Probe RNA: NEGATIVE
GC Probe RNA: NEGATIVE

## 2012-08-10 ENCOUNTER — Encounter (HOSPITAL_COMMUNITY): Payer: Self-pay | Admitting: Anesthesiology

## 2012-08-10 ENCOUNTER — Encounter (HOSPITAL_COMMUNITY): Payer: Self-pay | Admitting: *Deleted

## 2012-08-10 ENCOUNTER — Inpatient Hospital Stay (HOSPITAL_COMMUNITY)
Admission: AD | Admit: 2012-08-10 | Discharge: 2012-08-13 | DRG: 370 | Disposition: A | Discharge status: Home / Self Care | Payer: BC Managed Care – PPO | Source: Ambulatory Visit | Attending: Obstetrics and Gynecology | Admitting: Obstetrics and Gynecology

## 2012-08-10 ENCOUNTER — Encounter: Payer: Self-pay | Admitting: Family Medicine

## 2012-08-10 ENCOUNTER — Encounter (HOSPITAL_COMMUNITY)
Admission: AD | Disposition: A | Discharge status: Home / Self Care | Payer: Self-pay | Source: Ambulatory Visit | Attending: Obstetrics and Gynecology

## 2012-08-10 ENCOUNTER — Inpatient Hospital Stay (HOSPITAL_COMMUNITY): Payer: BC Managed Care – PPO | Admitting: Anesthesiology

## 2012-08-10 DIAGNOSIS — E119 Type 2 diabetes mellitus without complications: Secondary | ICD-10-CM

## 2012-08-10 DIAGNOSIS — E079 Disorder of thyroid, unspecified: Secondary | ICD-10-CM | POA: Diagnosis present

## 2012-08-10 DIAGNOSIS — O2432 Unspecified pre-existing diabetes mellitus in childbirth: Secondary | ICD-10-CM | POA: Diagnosis present

## 2012-08-10 DIAGNOSIS — O09529 Supervision of elderly multigravida, unspecified trimester: Secondary | ICD-10-CM | POA: Diagnosis present

## 2012-08-10 DIAGNOSIS — O4290 Premature rupture of membranes, unspecified as to length of time between rupture and onset of labor, unspecified weeks of gestation: Secondary | ICD-10-CM | POA: Diagnosis present

## 2012-08-10 DIAGNOSIS — E039 Hypothyroidism, unspecified: Secondary | ICD-10-CM | POA: Diagnosis present

## 2012-08-10 DIAGNOSIS — E890 Postprocedural hypothyroidism: Secondary | ICD-10-CM

## 2012-08-10 LAB — CBC
HCT: 33.6 % — ABNORMAL LOW (ref 36.0–46.0)
Hemoglobin: 11 g/dL — ABNORMAL LOW (ref 12.0–15.0)
MCH: 28.4 pg (ref 26.0–34.0)
MCHC: 32.7 g/dL (ref 30.0–36.0)
MCV: 86.6 fL (ref 78.0–100.0)
Platelets: 244 10*3/uL (ref 150–400)
RBC: 3.88 MIL/uL (ref 3.87–5.11)
RDW: 14.3 % (ref 11.5–15.5)
WBC: 11.6 10*3/uL — ABNORMAL HIGH (ref 4.0–10.5)

## 2012-08-10 LAB — TYPE AND SCREEN
ABO/RH(D): O POS
Antibody Screen: NEGATIVE

## 2012-08-10 LAB — CULTURE, BETA STREP (GROUP B ONLY)

## 2012-08-10 LAB — ABO/RH: ABO/RH(D): O POS

## 2012-08-10 LAB — GLUCOSE, CAPILLARY
Glucose-Capillary: 118 mg/dL — ABNORMAL HIGH (ref 70–99)
Glucose-Capillary: 83 mg/dL (ref 70–99)
Glucose-Capillary: 70 mg/dL (ref 70–99)
Glucose-Capillary: 143 mg/dL — ABNORMAL HIGH (ref 70–99)

## 2012-08-10 LAB — RPR: RPR Ser Ql: NONREACTIVE

## 2012-08-10 SURGERY — Surgical Case
Anesthesia: Epidural | Site: Abdomen | Wound class: Clean Contaminated

## 2012-08-10 MED ORDER — LIDOCAINE HCL (PF) 1 % IJ SOLN
INTRAMUSCULAR | Status: DC | PRN
  Administered 2012-08-10 (×2): 4 mL

## 2012-08-10 MED ORDER — ONDANSETRON HCL 4 MG/2ML IJ SOLN
4.0000 mg | Freq: Four times a day (QID) | INTRAMUSCULAR | Status: DC | PRN
  Administered 2012-08-10: 4 mg via INTRAVENOUS
  Filled 2012-08-10: qty 2

## 2012-08-10 MED ORDER — MORPHINE SULFATE 0.5 MG/ML IJ SOLN
INTRAMUSCULAR | Status: AC
  Filled 2012-08-10: qty 10

## 2012-08-10 MED ORDER — OXYTOCIN BOLUS FROM INFUSION: 500.0000 mL | INTRAVENOUS | Status: DC

## 2012-08-10 MED ORDER — MEPERIDINE HCL 25 MG/ML IJ SOLN
INTRAMUSCULAR | Status: AC
  Administered 2012-08-10: 6.25 mg via INTRAVENOUS
  Filled 2012-08-10: qty 1

## 2012-08-10 MED ORDER — ONDANSETRON HCL 4 MG/2ML IJ SOLN
INTRAMUSCULAR | Status: AC
  Filled 2012-08-10: qty 2

## 2012-08-10 MED ORDER — TERBUTALINE SULFATE 1 MG/ML IJ SOLN: 0.2500 mg | Freq: Once | INTRAMUSCULAR | Status: DC

## 2012-08-10 MED ORDER — OXYTOCIN 40 UNITS IN LACTATED RINGERS INFUSION - SIMPLE MED
1.0000 m[IU]/min | INTRAVENOUS | Status: DC
  Administered 2012-08-10: 2 m[IU]/min via INTRAVENOUS
  Filled 2012-08-10: qty 1000

## 2012-08-10 MED ORDER — PHENYLEPHRINE 40 MCG/ML (10ML) SYRINGE FOR IV PUSH (FOR BLOOD PRESSURE SUPPORT): 80.0000 ug | PREFILLED_SYRINGE | INTRAVENOUS | Status: DC | PRN

## 2012-08-10 MED ORDER — CEFAZOLIN SODIUM-DEXTROSE 2-3 GM-% IV SOLR
INTRAVENOUS | Status: AC
  Filled 2012-08-10: qty 50

## 2012-08-10 MED ORDER — TERBUTALINE SULFATE 1 MG/ML IJ SOLN
0.2500 mg | Freq: Once | INTRAMUSCULAR | Status: AC | PRN
  Administered 2012-08-10: 0.25 mg via SUBCUTANEOUS
  Filled 2012-08-10: qty 1

## 2012-08-10 MED ORDER — OXYCODONE-ACETAMINOPHEN 5-325 MG PO TABS: 1.0000 | ORAL_TABLET | ORAL | Status: DC | PRN

## 2012-08-10 MED ORDER — CITRIC ACID-SODIUM CITRATE 334-500 MG/5ML PO SOLN
30.0000 mL | ORAL | Status: DC | PRN
  Administered 2012-08-10: 30 mL via ORAL
  Filled 2012-08-10: qty 15

## 2012-08-10 MED ORDER — SCOPOLAMINE 1 MG/3DAYS TD PT72: 1.0000 | MEDICATED_PATCH | Freq: Once | TRANSDERMAL | Status: DC

## 2012-08-10 MED ORDER — FENTANYL CITRATE 0.05 MG/ML IJ SOLN: 25.0000 ug | INTRAMUSCULAR | Status: DC | PRN

## 2012-08-10 MED ORDER — CEFAZOLIN SODIUM-DEXTROSE 2-3 GM-% IV SOLR
INTRAVENOUS | Status: DC | PRN
  Administered 2012-08-10: 2 g via INTRAVENOUS

## 2012-08-10 MED ORDER — ACETAMINOPHEN 325 MG PO TABS: 650.0000 mg | ORAL_TABLET | ORAL | Status: DC | PRN

## 2012-08-10 MED ORDER — FENTANYL 2.5 MCG/ML BUPIVACAINE 1/10 % EPIDURAL INFUSION (WH - ANES)
INTRAMUSCULAR | Status: DC | PRN
  Administered 2012-08-10: 14 mL/h via EPIDURAL

## 2012-08-10 MED ORDER — SODIUM BICARBONATE 8.4 % IV SOLN
INTRAVENOUS | Status: AC
  Filled 2012-08-10: qty 50

## 2012-08-10 MED ORDER — LEVOTHYROXINE SODIUM 125 MCG PO TABS
125.0000 ug | ORAL_TABLET | Freq: Every day | ORAL | Status: DC
  Administered 2012-08-11 – 2012-08-12 (×2): 125 ug via ORAL
  Filled 2012-08-10 (×5): qty 1

## 2012-08-10 MED ORDER — SCOPOLAMINE 1 MG/3DAYS TD PT72
MEDICATED_PATCH | TRANSDERMAL | Status: AC
  Administered 2012-08-10: 1.5 mg via TRANSDERMAL
  Filled 2012-08-10: qty 1

## 2012-08-10 MED ORDER — KETOROLAC TROMETHAMINE 30 MG/ML IJ SOLN
30.0000 mg | Freq: Four times a day (QID) | INTRAMUSCULAR | Status: AC | PRN
  Administered 2012-08-11: 30 mg via INTRAVENOUS
  Filled 2012-08-10: qty 1

## 2012-08-10 MED ORDER — MEPERIDINE HCL 25 MG/ML IJ SOLN
6.2500 mg | INTRAMUSCULAR | Status: AC | PRN
  Administered 2012-08-10: 6.25 mg via INTRAVENOUS

## 2012-08-10 MED ORDER — PHENYLEPHRINE HCL 10 MG/ML IJ SOLN
INTRAMUSCULAR | Status: DC | PRN
  Administered 2012-08-10 (×2): 80 ug via INTRAVENOUS
  Administered 2012-08-10: 40 ug via INTRAVENOUS
  Administered 2012-08-10 (×2): 80 ug via INTRAVENOUS

## 2012-08-10 MED ORDER — EPHEDRINE SULFATE 50 MG/ML IJ SOLN
INTRAMUSCULAR | Status: DC | PRN
  Administered 2012-08-10: 15 mg via INTRAVENOUS
  Administered 2012-08-10: 5 mg via INTRAVENOUS

## 2012-08-10 MED ORDER — PHENYLEPHRINE 40 MCG/ML (10ML) SYRINGE FOR IV PUSH (FOR BLOOD PRESSURE SUPPORT)
PREFILLED_SYRINGE | INTRAVENOUS | Status: AC
  Filled 2012-08-10: qty 5

## 2012-08-10 MED ORDER — EPHEDRINE 5 MG/ML INJ: 10.0000 mg | INTRAVENOUS | Status: DC | PRN

## 2012-08-10 MED ORDER — IBUPROFEN 600 MG PO TABS: 600.0000 mg | ORAL_TABLET | Freq: Four times a day (QID) | ORAL | Status: DC | PRN

## 2012-08-10 MED ORDER — OXYTOCIN 10 UNIT/ML IJ SOLN
INTRAMUSCULAR | Status: AC
  Filled 2012-08-10: qty 4

## 2012-08-10 MED ORDER — LACTATED RINGERS IV SOLN: INTRAVENOUS | Status: DC

## 2012-08-10 MED ORDER — DIPHENHYDRAMINE HCL 50 MG/ML IJ SOLN: 12.5000 mg | INTRAMUSCULAR | Status: DC | PRN

## 2012-08-10 MED ORDER — FENTANYL CITRATE 0.05 MG/ML IJ SOLN
INTRAMUSCULAR | Status: AC
  Filled 2012-08-10: qty 5

## 2012-08-10 MED ORDER — 0.9 % SODIUM CHLORIDE (POUR BTL) OPTIME
TOPICAL | Status: DC | PRN
  Administered 2012-08-10: 1000 mL

## 2012-08-10 MED ORDER — EPHEDRINE 5 MG/ML INJ
INTRAVENOUS | Status: AC
  Filled 2012-08-10: qty 10

## 2012-08-10 MED ORDER — MORPHINE SULFATE (PF) 0.5 MG/ML IJ SOLN
INTRAMUSCULAR | Status: DC | PRN
  Administered 2012-08-10: 1 mg via INTRAVENOUS

## 2012-08-10 MED ORDER — ONDANSETRON HCL 4 MG/2ML IJ SOLN
INTRAMUSCULAR | Status: DC | PRN
  Administered 2012-08-10: 4 mg via INTRAVENOUS

## 2012-08-10 MED ORDER — KETOROLAC TROMETHAMINE 30 MG/ML IJ SOLN: 30.0000 mg | Freq: Four times a day (QID) | INTRAMUSCULAR | Status: AC | PRN

## 2012-08-10 MED ORDER — LACTATED RINGERS IV SOLN
INTRAVENOUS | Status: DC
  Administered 2012-08-10 (×3): via INTRAVENOUS

## 2012-08-10 MED ORDER — OXYTOCIN 40 UNITS IN LACTATED RINGERS INFUSION - SIMPLE MED: 62.5000 mL/h | INTRAVENOUS | Status: DC

## 2012-08-10 MED ORDER — LIDOCAINE HCL (PF) 1 % IJ SOLN: 30.0000 mL | INTRAMUSCULAR | Status: DC | PRN

## 2012-08-10 MED ORDER — NALBUPHINE SYRINGE 5 MG/0.5 ML: 5.0000 mg | INJECTION | INTRAMUSCULAR | Status: DC | PRN

## 2012-08-10 MED ORDER — MORPHINE SULFATE (PF) 0.5 MG/ML IJ SOLN
INTRAMUSCULAR | Status: DC | PRN
  Administered 2012-08-10: 4 mg via EPIDURAL

## 2012-08-10 MED ORDER — EPHEDRINE 5 MG/ML INJ
10.0000 mg | INTRAVENOUS | Status: DC | PRN
  Filled 2012-08-10: qty 4

## 2012-08-10 MED ORDER — LACTATED RINGERS IV SOLN: 500.0000 mL | INTRAVENOUS | Status: DC | PRN

## 2012-08-10 MED ORDER — LACTATED RINGERS IV SOLN
INTRAVENOUS | Status: DC | PRN
  Administered 2012-08-10: 21:00:00 via INTRAVENOUS

## 2012-08-10 MED ORDER — PHENYLEPHRINE 40 MCG/ML (10ML) SYRINGE FOR IV PUSH (FOR BLOOD PRESSURE SUPPORT)
80.0000 ug | PREFILLED_SYRINGE | INTRAVENOUS | Status: DC | PRN
  Filled 2012-08-10: qty 5

## 2012-08-10 MED ORDER — FENTANYL 2.5 MCG/ML BUPIVACAINE 1/10 % EPIDURAL INFUSION (WH - ANES)
14.0000 mL/h | INTRAMUSCULAR | Status: DC | PRN
  Administered 2012-08-10: 14 mL/h via EPIDURAL
  Filled 2012-08-10 (×2): qty 125

## 2012-08-10 MED ORDER — SODIUM BICARBONATE 8.4 % IV SOLN
INTRAVENOUS | Status: DC | PRN
  Administered 2012-08-10: 5 mL via EPIDURAL
  Administered 2012-08-10: 10 mL via EPIDURAL
  Administered 2012-08-10: 5 mL via EPIDURAL

## 2012-08-10 MED ORDER — LACTATED RINGERS IV SOLN: 500.0000 mL | Freq: Once | INTRAVENOUS | Status: DC

## 2012-08-10 MED ORDER — OXYTOCIN 10 UNIT/ML IJ SOLN
40.0000 [IU] | INTRAVENOUS | Status: DC | PRN
  Administered 2012-08-10: 40 [IU] via INTRAVENOUS

## 2012-08-10 MED ORDER — LIDOCAINE-EPINEPHRINE (PF) 2 %-1:200000 IJ SOLN
INTRAMUSCULAR | Status: AC
  Filled 2012-08-10: qty 20

## 2012-08-10 MED ORDER — KETOROLAC TROMETHAMINE 30 MG/ML IJ SOLN
INTRAMUSCULAR | Status: AC
  Administered 2012-08-10: 30 mg via INTRAVENOUS
  Filled 2012-08-10: qty 1

## 2012-08-10 SURGICAL SUPPLY — 29 items
BRR ADH 6X5 SEPRAFILM 1 SHT (MISCELLANEOUS)
CLAMP CORD UMBIL (MISCELLANEOUS) ×1 IMPLANT
CONTAINER PREFILL 10% NBF 15ML (MISCELLANEOUS) IMPLANT
DRAPE LG THREE QUARTER DISP (DRAPES) ×2 IMPLANT
DRSG OPSITE POSTOP 4X10 (GAUZE/BANDAGES/DRESSINGS) ×2 IMPLANT
DURAPREP 26ML APPLICATOR (WOUND CARE) IMPLANT
ELECT REM PT RETURN 9FT ADLT (ELECTROSURGICAL) ×2
ELECTRODE REM PT RTRN 9FT ADLT (ELECTROSURGICAL) ×1 IMPLANT
EXTRACTOR VACUUM M CUP 4 TUBE (SUCTIONS) IMPLANT
GLOVE BIOGEL PI IND STRL 6.5 (GLOVE) ×1 IMPLANT
GLOVE BIOGEL PI INDICATOR 6.5 (GLOVE) ×1
GLOVE SURG SS PI 6.0 STRL IVOR (GLOVE) ×2 IMPLANT
GOWN STRL REIN XL XLG (GOWN DISPOSABLE) ×4 IMPLANT
KIT ABG SYR 3ML LUER SLIP (SYRINGE) ×1 IMPLANT
NDL HYPO 25X5/8 SAFETYGLIDE (NEEDLE) IMPLANT
NEEDLE HYPO 25X5/8 SAFETYGLIDE (NEEDLE) ×2 IMPLANT
NS IRRIG 1000ML POUR BTL (IV SOLUTION) ×1 IMPLANT
PACK C SECTION WH (CUSTOM PROCEDURE TRAY) ×2 IMPLANT
PAD ABD 7.5X8 STRL (GAUZE/BANDAGES/DRESSINGS) ×2 IMPLANT
PAD OB MATERNITY 4.3X12.25 (PERSONAL CARE ITEMS) ×2 IMPLANT
RTRCTR C-SECT PINK 25CM LRG (MISCELLANEOUS) IMPLANT
SEPRAFILM MEMBRANE 5X6 (MISCELLANEOUS) IMPLANT
STAPLER VISISTAT 35W (STAPLE) ×2 IMPLANT
SUT PLAIN 0 NONE (SUTURE) IMPLANT
SUT VIC AB 0 CT1 36 (SUTURE) ×8 IMPLANT
SUT VIC AB 4-0 KS 27 (SUTURE) ×2 IMPLANT
TOWEL OR 17X24 6PK STRL BLUE (TOWEL DISPOSABLE) ×6 IMPLANT
TRAY FOLEY CATH 14FR (SET/KITS/TRAYS/PACK) ×1 IMPLANT
WATER STERILE IRR 1000ML POUR (IV SOLUTION) ×2 IMPLANT

## 2012-08-10 NOTE — Progress Notes (Signed)
FHR developed worsening decels and progressed to fetal bradycardia withrate in 50s.   Usual measures were employed including oxygen, position change  Continued amnioinfusion, terbutaline without improvement.   Dr Jolayne Panther called and decision was made to proceed with C/S.

## 2012-08-10 NOTE — Progress Notes (Addendum)
   Subjective: Pt reports comfortable.  No questions or concerns.  Pitocin turned off due to variable decels.   Objective:     VSS FHT:  FHR: 120's bpm, variability: moderate,  accelerations:  Present,  decelerations:  Present variable decels to 90's, with return to baseline. UC:   irregular, every 2-6 minutes SVE:   4/50/-3  Labs: Lab Results  Component Value Date   WBC 11.6* 08/10/2012   HGB 11.0* 08/10/2012   HCT 33.6* 08/10/2012   MCV 86.6 08/10/2012   PLT 244 08/10/2012    Assessment / Plan: Augmentation of Labor; PPROM   Labor: Augmentation of Labor; PPROM Preeclampsia:  n/a Fetal Wellbeing:  Category II Pain Control:  Epidural I/D:  n/a Anticipated MOD:  NSVD  Consulted with Dr. Jolayne Panther > defer IUPC until active labor  [facility] 08/10/2012, 8:34 PM

## 2012-08-10 NOTE — Anesthesia Postprocedure Evaluation (Signed)
  Anesthesia Post-op Note  Anesthesia Post Note  Patient: Briana Fuentes  Procedure(s) Performed: Procedure(s) (LRB): CESAREAN SECTION (N/A)  Anesthesia type: Epidural  Patient location: PACU  Post pain: Pain level controlled  Post assessment: Post-op Vital signs reviewed  Last Vitals:  Filed Vitals:   08/10/12 2315  BP: 102/45  Pulse: 91  Temp:   Resp: 20    Post vital signs: stable  Level of consciousness: awake  Complications: No apparent anesthesia complications

## 2012-08-10 NOTE — Progress Notes (Signed)
S/O:  Comfortable FHR variable decels continue, right now they are small and with contractions UCs every 2-3 minutes  Good accels between  Dilation: 4.5 Effacement (%): 80 Cervical Position: Anterior Station: 0 Presentation: Vertex Exam by:: lee  Exam by me  5/90/-2/vtx  IUPC inserted Will start amnioinfusion.

## 2012-08-10 NOTE — H&P (Signed)
Briana Fuentes is a 37 y.o. G3P1102 at [redacted]w[redacted]d with history of Type II Diabetes and Hypothyroidism (s/p Thyroidectomy) who presents with PPROM.  Patient reports that her water broke at 6:30 am.  She reports clear fluid. Patient has been having some contractions for the past few days and reports increase in frequency following ROM.  No vaginal bleeding.  No other complaints at this time.  ROS: patient denies chest pain, SOB, LE edema, RUQ pain.   *Patient receives her [facility] at [facility].   History OB History   Grav Para Term Preterm Abortions TAB SAB Ect Mult Living   3 2 1 1  0 0 0 0 0 2     Past Medical History  Diagnosis Date  . Hypothyroidism   . Diabetes mellitus without complication   . Infertility, female    Past Surgical History  Procedure Laterality Date  . Thyroidectomy     Family History: family history includes Heart disease in her father and Stroke in her father. Social History:  reports that she has never smoked. She has never used smokeless tobacco. She reports that she does not drink alcohol or use illicit drugs.   Prenatal Transfer Tool  Maternal Diabetes: Yes. Type 2 Diabetic (Class B) Genetic Screening: Declined Maternal Ultrasounds/Referrals: Normal Fetal Ultrasounds or other Referrals:  None Maternal Substance Abuse:  No Significant Maternal Medications:  Synthroid, Glyburide Significant Maternal Lab Results:  Lab values include: Group B Strep negative  ROS Per HPI Exam Physical Exam  Gen: well appearing, NAD. Heart: RRR Lungs: CTAB. Abd: gravid but otherwise soft, nontender to palpation Ext: no appreciable lower extremity edema bilaterally Neuro: No focal deficits. Cervical exam:  Dilation: 3 Effacement (%): 50 Cervical Position: Posterior Station: -3 Presentation: Vertex Exam by:: S. Carrera, RNC  FHR: baseline 135, mod variability, 15x15 accels, no decels Toco: Irregular, 1-6 min  Prenatal labs: ABO, Rh: O/POS/-- (03/10 1104) Antibody: NEG (03/10  1104) Rubella: 6.28 (03/10 1104) RPR: NON REAC (03/10 1104)  HBsAg: NEGATIVE (03/10 1104)  HIV: NON REACTIVE (03/10 1104)  GBS: Negative (06/16 0000)   Assessment/Plan: 37 y.o. [id number] at [redacted]w[redacted]d who presents with PPROM. - Will admit to L&D - Expectant management.  May need augmentation with Pitocin. - Q4 hour CBG's given Type 2 diabetes.  Everlene Other 08/10/2012, 8:13 AM  I examined pt and agree with documentation above and resident plan of care. [facility]

## 2012-08-10 NOTE — Transfer of Care (Signed)
2Immediate Anesthesia Transfer of Care Note  Patient: Briana Fuentes  Procedure(s) Performed: Procedure(s) with comments: CESAREAN SECTION (N/A) - Primary Cesarean Section Delivery Baby Girl @ 2126, Apgars 9/9  Patient Location: PACU  Anesthesia Type:Epidural  Level of Consciousness: awake  Airway & Oxygen Therapy: Patient Spontanous Breathing  Post-op Assessment: Report given to PACU RN and Post -op Vital signs reviewed and stable  Post vital signs: stable  Complications: No apparent anesthesia complications

## 2012-08-10 NOTE — Anesthesia Procedure Notes (Signed)
Epidural Patient location during procedure: OB Start time: 08/10/2012 1:14 PM  Staffing Anesthesiologist: FOSTER, MICHAEL A. Performed by: anesthesiologist   Preanesthetic Checklist Completed: patient identified, site marked, surgical consent, pre-op evaluation, timeout performed, IV checked, risks and benefits discussed and monitors and equipment checked  Epidural Patient position: sitting Prep: site prepped and draped and DuraPrep Patient monitoring: continuous pulse ox and blood pressure Approach: midline Injection technique: LOR air  Needle:  Needle type: Tuohy  Needle gauge: 17 G Needle length: 9 cm and 9 Needle insertion depth: 4 cm Catheter type: closed end flexible Catheter size: 19 Gauge Catheter at skin depth: 9 cm Test dose: negative and Other  Assessment Events: blood not aspirated, injection not painful, no injection resistance, negative IV test and no paresthesia  Additional Notes Patient identified. Risks and benefits discussed including failed block, incomplete  Pain control, post dural puncture headache, nerve damage, paralysis, blood pressure Changes, nausea, vomiting, reactions to medications-both toxic and allergic and post Partum back pain. All questions were answered. Patient expressed understanding and wished to proceed. Sterile technique was used throughout procedure. Epidural site was Dressed with sterile barrier dressing. No paresthesias, signs of intravascular injection Or signs of intrathecal spread were encountered.  Patient was more comfortable after the epidural was dosed. Please see RN's note for documentation of vital signs and FHR which are stable.

## 2012-08-10 NOTE — Progress Notes (Signed)
Baby not placed skin to skin in OR due to maternal status.  Mother was nauseous and throwing up and made the informed decision not to hold infant skin to skin.

## 2012-08-10 NOTE — Op Note (Signed)
[name] PROCEDURE DATE: 08/10/2012  PREOPERATIVE DIAGNOSIS: Intrauterine pregnancy at  [redacted]w[redacted]d weeks gestation; non-reassuring fetal status  POSTOPERATIVE DIAGNOSIS: The same  PROCEDURE:     Cesarean Section  SURGEON:  Dr. Gigi Gin Constant  ASSISTANT: none   INDICATIONS: Briana Fuentes is a 37 y.o. [id number] at [redacted]w[redacted]d scheduled for cesarean section secondary to non-reassuring fetal status.  Patient started with induction of labor secondary to PPROM. Patient with recurrent variable decels with prompted the start of an amnioinfusion. Tracing became category I for a few minutes followed by fetal bradycardia to the 80's. The risks of cesarean section discussed with the patient included but were not limited to: bleeding which may require transfusion or reoperation; infection which may require antibiotics; injury to bowel, bladder, ureters or other surrounding organs; injury to the fetus; need for additional procedures including hysterectomy in the event of a life-threatening hemorrhage; placental abnormalities wth subsequent pregnancies, incisional problems, thromboembolic phenomenon and other postoperative/anesthesia complications. The patient concurred with the proposed plan, giving informed written consent for the procedure.    FINDINGS:  Viable female infant in cephalic presentation.  Apgars 9 and 9.  Clear amniotic fluid.  Intact placenta, three vessel cord.  Normal uterus, fallopian tubes and ovaries bilaterally.  ANESTHESIA:    Spinal INTRAVENOUS FLUIDS:2200 ml ESTIMATED BLOOD LOSS: 500 ml URINE OUTPUT:  200 ml SPECIMENS: Placenta sent to L&D COMPLICATIONS: None immediate  PROCEDURE IN DETAIL:  The patient received intravenous antibiotics and had sequential compression devices applied to her lower extremities while in the preoperative area.  She was then taken to the operating room where anesthesia was induced and was found to be adequate. A foley catheter was placed into her bladder and attached to  constant gravity. She was then placed in a dorsal supine position with a leftward tilt, and prepped and draped in a sterile manner. After an adequate timeout was performed, a Pfannenstiel skin incision was made with scalpel and carried through to the underlying layer of fascia. The fascia was incised in the midline and this incision was extended bilaterally using the Mayo scissors. Kocher clamps were applied to the superior aspect of the fascial incision and the underlying rectus muscles were dissected off bluntly. A similar process was carried out on the inferior aspect of the facial incision. The rectus muscles were separated in the midline bluntly and the peritoneum was entered bluntly. The bladder blade was inserted. A transverse hysterotomy was made with a scalpel and extended bilaterally bluntly. The infant was successfully delivered, and cord was clamped and cut and infant was handed over to awaiting neonatology team. Uterine massage was then administered and the placenta delivered intact with three-vessel cord. The uterus was cleared of clot and debris.  The hysterotomy was closed with 0 Vicryl in a running locked fashion, and an imbricating layer was also placed with a 0 Vicryl. Overall, excellent hemostasis was noted. The pelvis copiously irrigated and cleared of all clot and debris. Hemostasis was confirmed on all surfaces.  The peritoneum and the muscles were reapproximated using 0 vicryl interrupted stitches. The fascia was then closed using 0 Vicryl in a running locked fashion.  The subcutaneous layer was reapproximated with plain gut and the skin was closed in a subcuticular fashion using 3.0 Vicryl. The patient tolerated the procedure well. Sponge, lap, instrument and needle counts were correct x 2. She was taken to the recovery room in stable condition.    CONSTANT,PEGGYMD  08/10/2012 9:57 PM

## 2012-08-10 NOTE — Progress Notes (Signed)
   Subjective: Pt reports continued comfort  Objective: VSS FHT:  FHR: 120's bpm, variability: moderate,  accelerations:  Present,  decelerations:  Present variable decels with return to baseline. UC:   irregular, every 2-6 minutes SVE:   4/70/-3 Labs: Lab Results  Component Value Date   WBC 11.6* 08/10/2012   HGB 11.0* 08/10/2012   HCT 33.6* 08/10/2012   MCV 86.6 08/10/2012   PLT 244 08/10/2012    Assessment / Plan: Augmentation of Labor - Slow Progression  Labor: Augmentation of Labor - Slow Progression Preeclampsia:  n/a Fetal Wellbeing:  Category II Pain Control:  Epidural I/D:  GBS neg Anticipated MOD:  NSVD  [facility] 08/10/2012, 8:37 PM

## 2012-08-10 NOTE — MAU Note (Signed)
SROM at 0630, clear fluid. UC's started after.

## 2012-08-10 NOTE — Anesthesia Preprocedure Evaluation (Signed)
Anesthesia Evaluation  Patient identified by MRN, date of birth, ID band Patient awake    Reviewed: Allergy & Precautions, H&P , NPO status , Patient's Chart, lab work & pertinent test results  Airway Mallampati: III TM Distance: >3 FB Neck ROM: Full    Dental no notable dental hx. (+) Teeth Intact   Pulmonary neg pulmonary ROS,  breath sounds clear to auscultation  Pulmonary exam normal       Cardiovascular negative cardio ROS  Rhythm:Regular Rate:Normal     Neuro/Psych negative neurological ROS  negative psych ROS   GI/Hepatic negative GI ROS, Neg liver ROS,   Endo/Other  diabetes, Well Controlled, Type 2, Oral Hypoglycemic AgentsHypothyroidism   Renal/GU negative Renal ROS  negative genitourinary   Musculoskeletal negative musculoskeletal ROS (+)   Abdominal   Peds  Hematology negative hematology ROS (+)   Anesthesia Other Findings   Reproductive/Obstetrics SROM PTL  36 3/7 weeks                           Anesthesia Physical Anesthesia Plan  ASA: II  Anesthesia Plan: Epidural   Post-op Pain Management:    Induction:   Airway Management Planned: Natural Airway  Additional Equipment:   Intra-op Plan:   Post-operative Plan:   Informed Consent: I have reviewed the patients History and Physical, chart, labs and discussed the procedure including the risks, benefits and alternatives for the proposed anesthesia with the patient or authorized representative who has indicated his/her understanding and acceptance.   Dental advisory given  Plan Discussed with: Anesthesiologist  Anesthesia Plan Comments:         Anesthesia Quick Evaluation

## 2012-08-11 ENCOUNTER — Encounter: Payer: Self-pay | Admitting: Obstetrics and Gynecology

## 2012-08-11 ENCOUNTER — Encounter (HOSPITAL_COMMUNITY): Payer: Self-pay | Admitting: Obstetrics and Gynecology

## 2012-08-11 LAB — GLUCOSE, CAPILLARY
Glucose-Capillary: 138 mg/dL — ABNORMAL HIGH (ref 70–99)
Glucose-Capillary: 132 mg/dL — ABNORMAL HIGH (ref 70–99)
Glucose-Capillary: 124 mg/dL — ABNORMAL HIGH (ref 70–99)
Glucose-Capillary: 155 mg/dL — ABNORMAL HIGH (ref 70–99)
Glucose-Capillary: 133 mg/dL — ABNORMAL HIGH (ref 70–99)
Glucose-Capillary: 110 mg/dL — ABNORMAL HIGH (ref 70–99)

## 2012-08-11 LAB — CBC
HCT: 29.6 % — ABNORMAL LOW (ref 36.0–46.0)
Hemoglobin: 9.7 g/dL — ABNORMAL LOW (ref 12.0–15.0)
MCH: 28.4 pg (ref 26.0–34.0)
MCHC: 32.8 g/dL (ref 30.0–36.0)
MCV: 86.5 fL (ref 78.0–100.0)
Platelets: 190 10*3/uL (ref 150–400)
RBC: 3.42 MIL/uL — ABNORMAL LOW (ref 3.87–5.11)
RDW: 14.3 % (ref 11.5–15.5)
WBC: 14.7 10*3/uL — ABNORMAL HIGH (ref 4.0–10.5)

## 2012-08-11 MED ORDER — DIPHENHYDRAMINE HCL 50 MG/ML IJ SOLN: 12.5000 mg | INTRAMUSCULAR | Status: DC | PRN

## 2012-08-11 MED ORDER — DIPHENHYDRAMINE HCL 25 MG PO CAPS: 25.0000 mg | ORAL_CAPSULE | Freq: Four times a day (QID) | ORAL | Status: DC | PRN

## 2012-08-11 MED ORDER — NALBUPHINE HCL 10 MG/ML IJ SOLN
5.0000 mg | INTRAMUSCULAR | Status: DC | PRN
  Filled 2012-08-11: qty 1

## 2012-08-11 MED ORDER — NALBUPHINE HCL 10 MG/ML IJ SOLN
5.0000 mg | INTRAMUSCULAR | Status: DC | PRN
  Administered 2012-08-11: 10 mg via INTRAVENOUS
  Filled 2012-08-11 (×2): qty 1

## 2012-08-11 MED ORDER — SENNOSIDES-DOCUSATE SODIUM 8.6-50 MG PO TABS
2.0000 | ORAL_TABLET | Freq: Every day | ORAL | Status: DC
  Administered 2012-08-11 – 2012-08-12 (×2): 2 via ORAL

## 2012-08-11 MED ORDER — DIPHENHYDRAMINE HCL 25 MG PO CAPS
25.0000 mg | ORAL_CAPSULE | ORAL | Status: DC | PRN
  Administered 2012-08-11: 25 mg via ORAL
  Filled 2012-08-11: qty 1

## 2012-08-11 MED ORDER — METFORMIN HCL 500 MG PO TABS
500.0000 mg | ORAL_TABLET | Freq: Two times a day (BID) | ORAL | Status: DC
Start: 2012-08-11 — End: 2012-08-13
  Administered 2012-08-11 – 2012-08-13 (×5): 500 mg via ORAL
  Filled 2012-08-11 (×6): qty 1

## 2012-08-11 MED ORDER — SIMETHICONE 80 MG PO CHEW
80.0000 mg | CHEWABLE_TABLET | ORAL | Status: DC | PRN
  Administered 2012-08-11: 80 mg via ORAL

## 2012-08-11 MED ORDER — OXYCODONE-ACETAMINOPHEN 5-325 MG PO TABS
1.0000 | ORAL_TABLET | ORAL | Status: DC | PRN
  Administered 2012-08-12 – 2012-08-13 (×5): 1 via ORAL
  Filled 2012-08-11 (×5): qty 1

## 2012-08-11 MED ORDER — NALOXONE HCL 0.4 MG/ML IJ SOLN: 0.4000 mg | INTRAMUSCULAR | Status: DC | PRN

## 2012-08-11 MED ORDER — OXYTOCIN 40 UNITS IN LACTATED RINGERS INFUSION - SIMPLE MED: 62.5000 mL/h | INTRAVENOUS | Status: AC

## 2012-08-11 MED ORDER — DIPHENHYDRAMINE HCL 50 MG/ML IJ SOLN: 25.0000 mg | INTRAMUSCULAR | Status: DC | PRN

## 2012-08-11 MED ORDER — KETOROLAC TROMETHAMINE 60 MG/2ML IM SOLN
60.0000 mg | Freq: Once | INTRAMUSCULAR | Status: AC | PRN
  Filled 2012-08-11: qty 2

## 2012-08-11 MED ORDER — SODIUM CHLORIDE 0.9 % IJ SOLN: 3.0000 mL | INTRAMUSCULAR | Status: DC | PRN

## 2012-08-11 MED ORDER — ONDANSETRON HCL 4 MG PO TABS: 4.0000 mg | ORAL_TABLET | ORAL | Status: DC | PRN

## 2012-08-11 MED ORDER — ONDANSETRON HCL 4 MG/2ML IJ SOLN
4.0000 mg | INTRAMUSCULAR | Status: DC | PRN
  Administered 2012-08-11: 4 mg via INTRAVENOUS
  Filled 2012-08-11: qty 2

## 2012-08-11 MED ORDER — LACTATED RINGERS IV SOLN
INTRAVENOUS | Status: DC
  Administered 2012-08-11: 06:00:00 via INTRAVENOUS

## 2012-08-11 MED ORDER — SIMETHICONE 80 MG PO CHEW
80.0000 mg | CHEWABLE_TABLET | Freq: Three times a day (TID) | ORAL | Status: DC
  Administered 2012-08-11 – 2012-08-13 (×8): 80 mg via ORAL

## 2012-08-11 MED ORDER — TETANUS-DIPHTH-ACELL PERTUSSIS 5-2.5-18.5 LF-MCG/0.5 IM SUSP
0.5000 mL | Freq: Once | INTRAMUSCULAR | Status: AC
  Administered 2012-08-11: 0.5 mL via INTRAMUSCULAR
  Filled 2012-08-11: qty 0.5

## 2012-08-11 MED ORDER — LANOLIN HYDROUS EX OINT: 1.0000 "application " | TOPICAL_OINTMENT | CUTANEOUS | Status: DC | PRN

## 2012-08-11 MED ORDER — ONDANSETRON HCL 4 MG/2ML IJ SOLN: 4.0000 mg | Freq: Three times a day (TID) | INTRAMUSCULAR | Status: DC | PRN

## 2012-08-11 MED ORDER — PRENATAL MULTIVITAMIN CH
1.0000 | ORAL_TABLET | Freq: Every day | ORAL | Status: DC
  Administered 2012-08-11 – 2012-08-12 (×2): 1 via ORAL
  Filled 2012-08-11 (×2): qty 1

## 2012-08-11 MED ORDER — NALOXONE HCL 1 MG/ML IJ SOLN
1.0000 ug/kg/h | INTRAMUSCULAR | Status: DC | PRN
  Filled 2012-08-11: qty 2

## 2012-08-11 MED ORDER — METOCLOPRAMIDE HCL 5 MG/ML IJ SOLN: 10.0000 mg | Freq: Three times a day (TID) | INTRAMUSCULAR | Status: DC | PRN

## 2012-08-11 MED ORDER — DIBUCAINE 1 % RE OINT: 1.0000 "application " | TOPICAL_OINTMENT | RECTAL | Status: DC | PRN

## 2012-08-11 MED ORDER — MENTHOL 3 MG MT LOZG: 1.0000 | LOZENGE | OROMUCOSAL | Status: DC | PRN

## 2012-08-11 MED ORDER — WITCH HAZEL-GLYCERIN EX PADS: 1.0000 "application " | MEDICATED_PAD | CUTANEOUS | Status: DC | PRN

## 2012-08-11 MED ORDER — IBUPROFEN 600 MG PO TABS
600.0000 mg | ORAL_TABLET | Freq: Four times a day (QID) | ORAL | Status: DC
  Administered 2012-08-11 – 2012-08-13 (×7): 600 mg via ORAL
  Filled 2012-08-11 (×8): qty 1

## 2012-08-11 NOTE — Anesthesia Postprocedure Evaluation (Signed)
Anesthesia Post Note  Patient: Briana Fuentes  Procedure(s) Performed: Procedure(s) (LRB): CESAREAN SECTION (N/A)  Anesthesia type: Epidural  Patient location: Mother/Baby  Post pain: Pain level controlled  Post assessment: Post-op Vital signs reviewed  Last Vitals:  Filed Vitals:   08/11/12 0620  BP: 88/35  Pulse: 63  Temp:   Resp: 18    Post vital signs: Reviewed  Level of consciousness: awake  Complications: No apparent anesthesia complications

## 2012-08-11 NOTE — Progress Notes (Signed)
Please specify diet order: regular or carb modified?

## 2012-08-11 NOTE — Progress Notes (Signed)
Dr Jolayne Panther explained the risks and benefits of c section for fetal intolerance of labor.  Pt verbalized understanding and signed consent.

## 2012-08-11 NOTE — Progress Notes (Signed)
Williams CNM updated on FHR.  Williams to report to bedside now.

## 2012-08-12 LAB — GLUCOSE, CAPILLARY
Glucose-Capillary: 156 mg/dL — ABNORMAL HIGH (ref 70–99)
Glucose-Capillary: 124 mg/dL — ABNORMAL HIGH (ref 70–99)
Glucose-Capillary: 115 mg/dL — ABNORMAL HIGH (ref 70–99)
Glucose-Capillary: 105 mg/dL — ABNORMAL HIGH (ref 70–99)

## 2012-08-12 NOTE — Progress Notes (Signed)
Subjective: Postpartum Day 2: Cesarean Delivery Eating, drinking, voiding, ambulating well.  +flatus.  Lochia and pain wnl.  Denies dizziness, lightheadedness, or sob. No complaints.  Wants to go home today, but states baby being placed on bili lights 'for a few hours'  Objective: Vital signs in last 24 hours: Temp:  [97.3 F (36.3 C)-98.5 F (36.9 C)] 97.3 F (36.3 C) (06/21 0513) Pulse Rate:  [56-66] 62 (06/21 0513) Resp:  [17-18] 17 (06/21 0513) BP: (87-102)/(48-68) 87/48 mmHg (06/21 0513) SpO2:  [98 %] 98 % (06/21 0513)  Physical Exam:  General: alert, cooperative and no distress Lochia: appropriate Uterine Fundus: firm Incision: healing well, no dehiscence, no significant erythema, bulky dressing removed, honeycomb dressing still intact- mod amount of drainage, none active. DVT Evaluation: No evidence of DVT seen on physical exam. Negative Homan's sign. No cords or calf tenderness. No significant calf/ankle edema.   Recent Labs  08/10/12 0841 08/11/12 0555  HGB 11.0* 9.7*  HCT 33.6* 29.6*    Assessment/Plan: Status post Cesarean section. Doing well postoperatively.  Continue current care. Breastfeeding Undecided about contraception If baby able to be d/c'd later, RN to call us for d/c for mom  Marge Duncans 08/12/2012, 9:32 AM

## 2012-08-13 ENCOUNTER — Encounter (HOSPITAL_COMMUNITY)
Admission: RE | Admit: 2012-08-13 | Discharge: 2012-08-13 | Disposition: A | Discharge status: Home / Self Care | Payer: BC Managed Care – PPO | Source: Ambulatory Visit | Attending: Family Medicine | Admitting: Family Medicine

## 2012-08-13 DIAGNOSIS — O923 Agalactia: Secondary | ICD-10-CM | POA: Insufficient documentation

## 2012-08-13 LAB — GLUCOSE, CAPILLARY: Glucose-Capillary: 58 mg/dL — ABNORMAL LOW (ref 70–99)

## 2012-08-13 MED ORDER — OXYCODONE-ACETAMINOPHEN 5-325 MG PO TABS: 1.0000 | ORAL_TABLET | ORAL | Status: DC | PRN

## 2012-08-13 MED ORDER — IBUPROFEN 600 MG PO TABS: 600.0000 mg | ORAL_TABLET | Freq: Four times a day (QID) | ORAL | Status: DC | PRN

## 2012-08-13 MED ORDER — METFORMIN HCL 500 MG PO TABS: 500.0000 mg | ORAL_TABLET | Freq: Two times a day (BID) | ORAL | Status: DC

## 2012-08-13 NOTE — Discharge Summary (Signed)
Attestation of Attending Supervision of Advanced Practitioner (CNM/NP): Evaluation and management procedures were performed by the Advanced Practitioner under my supervision and collaboration. I have reviewed the Advanced Practitioner's note and chart, and I agree with the management and plan.  LEGGETT,KELLY H. 7:15 PM   

## 2012-08-13 NOTE — Discharge Summary (Signed)
Obstetric Discharge Summary Reason for Admission: rupture of membranes Prenatal Procedures: none Intrapartum Procedures: cesarean: low cervical, transverse Postpartum Procedures: none Complications-Operative and Postpartum: none  Patient is breast and bottle feeding.  Patient plans to use condoms for contraception.  Patient is to continue Metformin 500mg  1 tablet twice a day given h/o Diabetes Type II.  Patient counseled to check blood sugar fasting and 2 hours postprandial and record these values for review at post-partum visit.    Hemoglobin  Date Value Range Status  08/11/2012 9.7* 12.0 - 15.0 g/dL Final     HCT  Date Value Range Status  08/11/2012 29.6* 36.0 - 46.0 % Final    Physical Exam:  General: alert, cooperative and appears stated age 37: appropriate Uterine Fundus: firm Incision: healing well, no significant drainage, no dehiscence, no significant erythema DVT Evaluation: No evidence of DVT seen on physical exam.  Discharge Diagnoses: Preterm Pregnancy Delivered  Discharge Information: Date: 08/13/2012 Activity: pelvic rest Diet: Diabetic Medications: PNV, Ibuprofen, Metformin 500mg  1 tablet twice a day Condition: stable and improved Instructions: Patient to follow-up in 6 weeks for post-partum visit. Discharge to: home Follow-up Information   Follow up with [facility] In 6 weeks.   Contact information:   [address] [place] [address] [phone]      Newborn Data: Live born female  Birth Weight: 5 lb 11.9 oz (2605 g) APGAR: 9, 9  Home with Family.  Briana Fuentes 08/13/2012, 7:13 AM  Hospital Course: Briana Fuentes is a 37 y.o. G3P1102 at [redacted]w[redacted]d with history of Type II Diabetes and Hypothyroidism (s/p Thyroidectomy) who presented with PPROM. Patient reported that her water broke at 6:30 am, clear fluid. Patient had been having some contractions for the past few days and reported increase in frequency following ROM. No vaginal  bleeding. No other complaints at this time.  ROS: patient denies chest pain, SOB, LE edema, RUQ pain.  *Patient receives her [facility] at [facility].  Op Note: PREOPERATIVE DIAGNOSIS: Intrauterine pregnancy at [redacted]w[redacted]d weeks gestation; non-reassuring fetal status  POSTOPERATIVE DIAGNOSIS: The same  PROCEDURE: Cesarean Section  SURGEON: Dr. Gigi Gin Constant  ASSISTANT: none  INDICATIONS: Briana Fuentes is a 37 y.o. K4M0102 at [redacted]w[redacted]d scheduled for cesarean section secondary to non-reassuring fetal status. Patient started with induction of labor secondary to PPROM. Patient with recurrent variable decels with prompted the start of an amnioinfusion. Tracing became category I for a few minutes followed by fetal bradycardia to the 80's. The risks of cesarean section discussed with the patient included but were not limited to: bleeding which may require transfusion or reoperation; infection which may require antibiotics; injury to bowel, bladder, ureters or other surrounding organs; injury to the fetus; need for additional procedures including hysterectomy in the event of a life-threatening hemorrhage; placental abnormalities wth subsequent pregnancies, incisional problems, thromboembolic phenomenon and other postoperative/anesthesia complications. The patient concurred with the proposed plan, giving informed written consent for the procedure.  FINDINGS: Viable female infant in cephalic presentation. Apgars 9 and 9. Clear amniotic fluid. Intact placenta, three vessel cord. Normal uterus, fallopian tubes and ovaries bilaterally.  ANESTHESIA: Spinal  She has done well postop and is ready for discharge.   Seen by me also and agree with note above. Wynelle Bourgeois CNM

## 2012-08-13 NOTE — Progress Notes (Signed)
Patient and Spouse educated several times on injuries that could occur if infant sleeps with mother. Also instructed on problems which could occur if infant placed on pillows and large fluffy blankets are used. Infant safe sleep also reviewed with parents. Mother still continues to sleep with infant in bed.

## 2012-08-14 ENCOUNTER — Other Ambulatory Visit: Payer: Self-pay

## 2012-08-14 ENCOUNTER — Ambulatory Visit (HOSPITAL_COMMUNITY): Payer: BC Managed Care – PPO

## 2012-08-14 ENCOUNTER — Other Ambulatory Visit: Payer: Self-pay | Admitting: Obstetrics and Gynecology

## 2012-08-14 NOTE — H&P (Signed)
Attestation of Attending Supervision of Advanced Practitioner (CNM/NP): Evaluation and management procedures were performed by the Advanced Practitioner under my supervision and collaboration.  I have reviewed the Advanced Practitioner's note and chart, and I agree with the management and plan.  CONSTANT,[name] 08/14/2012 12:02 PM   

## 2012-08-15 ENCOUNTER — Telehealth: Payer: Self-pay | Admitting: Obstetrics and Gynecology

## 2012-08-15 NOTE — Telephone Encounter (Signed)
[facility] called stating that [facility] on file does not have metformin and Ibuprofen. Called pharmacy and verbally gave order as written on chart. Patient notified and satisfied.

## 2012-08-16 ENCOUNTER — Other Ambulatory Visit: Payer: Self-pay | Admitting: Obstetrics and Gynecology

## 2012-08-16 NOTE — Telephone Encounter (Signed)
Received a call from [facility]  [employer]) stating is calling about a prescription sent in by Briana Fuentes for Metformin 500mg - states previously was on metformin er 500 mg 1 tab po daily- wants to clarify if new prescription is for er or plain and for daily or bid.

## 2012-08-16 NOTE — Telephone Encounter (Signed)
Spoke to Debra. Read and confirmed the latest Rx of Metformin written under [name] Constant on 08/13/12 which was Metformin 500 mg one tablet PO BID #60. It was not the the Metformin ER tablet that was called in. Debra stated understanding, says she will call patient.   

## 2012-08-16 NOTE — Telephone Encounter (Signed)
Spoke to [employer]. Read and confirmed the latest Rx of Metformin written under [name] Constant on 08/13/12 which was Metformin 500 mg one tablet PO BID #60. It was not the the Metformin ER tablet that was called in. Stanton Kidney stated understanding, says she will call patient.

## 2012-08-21 ENCOUNTER — Ambulatory Visit (HOSPITAL_COMMUNITY): Payer: Self-pay

## 2012-08-21 ENCOUNTER — Other Ambulatory Visit: Payer: Self-pay

## 2012-09-12 ENCOUNTER — Encounter (HOSPITAL_COMMUNITY)
Admission: RE | Admit: 2012-09-12 | Discharge: 2012-09-12 | Disposition: A | Discharge status: Home / Self Care | Payer: BC Managed Care – PPO | Source: Ambulatory Visit | Attending: Family Medicine | Admitting: Family Medicine

## 2012-09-12 DIAGNOSIS — O923 Agalactia: Secondary | ICD-10-CM | POA: Insufficient documentation

## 2012-09-13 ENCOUNTER — Ambulatory Visit: Payer: BC Managed Care – PPO | Admitting: Family Medicine

## 2012-10-03 ENCOUNTER — Other Ambulatory Visit: Payer: Self-pay | Admitting: Obstetrics and Gynecology

## 2012-10-13 ENCOUNTER — Encounter (HOSPITAL_COMMUNITY)
Admission: RE | Admit: 2012-10-13 | Discharge: 2012-10-13 | Disposition: A | Discharge status: Home / Self Care | Payer: BC Managed Care – PPO | Source: Ambulatory Visit | Attending: Family Medicine | Admitting: Family Medicine

## 2012-10-13 DIAGNOSIS — O923 Agalactia: Secondary | ICD-10-CM | POA: Insufficient documentation

## 2012-11-05 ENCOUNTER — Other Ambulatory Visit: Payer: Self-pay | Admitting: Obstetrics and Gynecology

## 2012-11-13 ENCOUNTER — Encounter: Payer: Self-pay | Admitting: Obstetrics & Gynecology

## 2012-11-13 ENCOUNTER — Encounter (HOSPITAL_COMMUNITY)
Admission: RE | Admit: 2012-11-13 | Discharge: 2012-11-13 | Disposition: A | Discharge status: Home / Self Care | Payer: BC Managed Care – PPO | Source: Ambulatory Visit | Attending: Family Medicine | Admitting: Family Medicine

## 2012-11-13 ENCOUNTER — Ambulatory Visit (INDEPENDENT_AMBULATORY_CARE_PROVIDER_SITE_OTHER): Payer: BC Managed Care – PPO | Admitting: Obstetrics & Gynecology

## 2012-11-13 DIAGNOSIS — O923 Agalactia: Secondary | ICD-10-CM | POA: Insufficient documentation

## 2012-11-13 DIAGNOSIS — E119 Type 2 diabetes mellitus without complications: Secondary | ICD-10-CM

## 2012-11-13 DIAGNOSIS — N926 Irregular menstruation, unspecified: Secondary | ICD-10-CM

## 2012-11-13 LAB — CBC
HCT: 33.2 % — ABNORMAL LOW (ref 36.0–46.0)
Hemoglobin: 11.7 g/dL — ABNORMAL LOW (ref 12.0–15.0)
MCH: 29.9 pg (ref 26.0–34.0)
MCHC: 35.2 g/dL (ref 30.0–36.0)
MCV: 84.9 fL (ref 78.0–100.0)
Platelets: 330 10*3/uL (ref 150–400)
RBC: 3.91 MIL/uL (ref 3.87–5.11)
RDW: 14.3 % (ref 11.5–15.5)
WBC: 9 10*3/uL (ref 4.0–10.5)

## 2012-11-13 MED ORDER — METFORMIN HCL 500 MG PO TABS: 1000.0000 mg | ORAL_TABLET | Freq: Two times a day (BID) | ORAL | Status: DC

## 2012-11-13 NOTE — Progress Notes (Signed)
Subjective:     Briana Fuentes is a 37 y.o. female who presents for a postpartum visit. She is 3 months postpartum following a low cervical transverse Cesarean section. I have fully reviewed the prenatal and intrapartum course. The delivery was at 36 6/7 gestational weeks. Outcome: primary cesarean section, low transverse incision. Postpartum course has been complicated with prolonged bleeding. Baby's course has been uncomplicated. Baby is feeding by bottle. Bleeding moderate lochia and prolonged. Bowel function is normal. Bladder function is normal. Patient is not sexually active. Contraception method is abstinence. Postpartum depression screening: negative.  The following portions of the patient's history were reviewed and updated as appropriate: allergies, current medications, past family history, past medical history, past social history, past surgical history and problem list.  Review of Systems pt reports weight gain since delivery   Objective:    BP 121/83  Pulse 74  Temp(Src) 96.5 F (35.8 C) (Oral)  Ht 5\' 1"  (1.549 m)  Wt 168 lb 4.8 oz (76.34 kg)  BMI 31.82 kg/m2  Breastfeeding? No  General:  alert and no distress           Abdomen: soft, non-tender; bowel sounds normal; no masses,  no organomegaly and incision clean/dry and intact   Assessment:    3 months postpartum exam. Pap smear not done at today's visit.   Plan:    1. Contraception: abstinence 2. Labs: TSH, HbA1c and CBC today 3. Follow up in: 6 months or as needed.  4. Pt to f/u with Endocrine as scheduled 5. Pelvic sono to check endometrial stripe

## 2012-11-14 LAB — HEMOGLOBIN A1C
Hgb A1c MFr Bld: 6.5 % — ABNORMAL HIGH (ref <5.7)
Mean Plasma Glucose: 140 mg/dL — ABNORMAL HIGH (ref <117)

## 2012-11-14 LAB — TSH: TSH: 1.103 u[IU]/mL (ref 0.350–4.500)

## 2012-11-16 ENCOUNTER — Ambulatory Visit (HOSPITAL_COMMUNITY)
Admission: RE | Admit: 2012-11-16 | Discharge: 2012-11-16 | Disposition: A | Discharge status: Home / Self Care | Payer: BC Managed Care – PPO | Source: Ambulatory Visit | Attending: Obstetrics & Gynecology | Admitting: Obstetrics & Gynecology

## 2012-11-16 ENCOUNTER — Other Ambulatory Visit: Payer: Self-pay | Admitting: Obstetrics & Gynecology

## 2012-11-16 DIAGNOSIS — N854 Malposition of uterus: Secondary | ICD-10-CM | POA: Insufficient documentation

## 2012-11-16 DIAGNOSIS — N831 Corpus luteum cyst of ovary, unspecified side: Secondary | ICD-10-CM | POA: Insufficient documentation

## 2012-11-16 DIAGNOSIS — D252 Subserosal leiomyoma of uterus: Secondary | ICD-10-CM | POA: Insufficient documentation

## 2012-11-16 DIAGNOSIS — N949 Unspecified condition associated with female genital organs and menstrual cycle: Secondary | ICD-10-CM | POA: Insufficient documentation

## 2012-11-16 DIAGNOSIS — N926 Irregular menstruation, unspecified: Secondary | ICD-10-CM

## 2012-11-16 DIAGNOSIS — N925 Other specified irregular menstruation: Secondary | ICD-10-CM | POA: Insufficient documentation

## 2012-11-16 DIAGNOSIS — N938 Other specified abnormal uterine and vaginal bleeding: Secondary | ICD-10-CM | POA: Insufficient documentation

## 2012-11-22 ENCOUNTER — Telehealth: Payer: Self-pay | Admitting: *Deleted

## 2012-11-22 NOTE — Telephone Encounter (Signed)
Pt left message requesting test results from 9/25.  I returned pt's call and informed her of [place] result as well as labs done on 9/22. Pt states that when she takes 2 tabs of metformin as she was instructed, she has nausea and vomiting. If she only takes 1 tablet, she feels fine. She has appt w/Dr. Talmage Nap later this month. I advised pt to call Dr. Willeen Cass office to see if she needs to be seen sooner.  Pt voiced understanding of all information and instructions.

## 2012-12-31 ENCOUNTER — Other Ambulatory Visit: Payer: Self-pay | Admitting: Obstetrics and Gynecology

## 2013-01-14 ENCOUNTER — Emergency Department (HOSPITAL_COMMUNITY)
Admission: EM | Admit: 2013-01-14 | Discharge: 2013-01-14 | Disposition: A | Discharge status: Home / Self Care | Payer: BC Managed Care – PPO | Source: Home / Self Care | Attending: Emergency Medicine | Admitting: Emergency Medicine

## 2013-01-14 ENCOUNTER — Encounter (HOSPITAL_COMMUNITY): Payer: Self-pay | Admitting: Emergency Medicine

## 2013-01-14 DIAGNOSIS — F410 Panic disorder [episodic paroxysmal anxiety] without agoraphobia: Secondary | ICD-10-CM

## 2013-01-14 DIAGNOSIS — F53 Postpartum depression: Secondary | ICD-10-CM

## 2013-01-14 DIAGNOSIS — O99345 Other mental disorders complicating the puerperium: Secondary | ICD-10-CM

## 2013-01-14 MED ORDER — FLUOXETINE HCL 20 MG PO TABS: 20.0000 mg | ORAL_TABLET | Freq: Every day | ORAL | Status: DC

## 2013-01-14 MED ORDER — LORAZEPAM 1 MG PO TABS: 1.0000 mg | ORAL_TABLET | Freq: Three times a day (TID) | ORAL | Status: DC | PRN

## 2013-01-14 MED ORDER — LORAZEPAM 1 MG PO TABS: 1.0000 mg | ORAL_TABLET | Freq: Three times a day (TID) | ORAL | Status: DC

## 2013-01-14 NOTE — ED Provider Notes (Signed)
Chief Complaint:   Chief Complaint  Patient presents with  . Anxiety    History of Present Illness:   Briana Fuentes is a 37 year old female who presents today with a history of panic attacks. These began with the birth of her third child which was 5 months ago. She did not have any problems at the birth of her first and second child. She had a C-section at [facility] hospital with no complications. She states she's not under any stress. Her baby is a good baby. Despite this she has felt nervous, anxious, and depressed. She denies any suicidal thoughts or homicidal ideation. Specifically she denies any thoughts about harming herself or her baby and she had crying spells and has difficulty sleeping at night. Her energy level is a little bit decreased. Panic attacks may occur once or several times a day or not at all. There is no specific precipitating factors. They come on suddenly. The patient states she'll cry and sometimes scream. Her heartbeat feels like is rapid and she has a smothering sensation. She describes tremulousness, but denies any dizziness, faintness, or paresthesias. She's never had these before. She is taking Synthroid under the care of Dr. Talmage Nap for a thyroid nodule. She also took metformin during her pregnancy for gestational diabetes and is still taking this right now. Her husband states her blood pressure is mildly elevated during these panic attacks but then always goes down afterwards.  Review of Systems:  Other than noted above, the patient denies any of the following symptoms: Systemic:  No fever, chills, sweats, fatigue, weight loss or gain. Resp:  No shortness of breath. Cardiovasc:  No chest pain, tightness, pressure, palpitations, dizziness, or syncope. GI:  No abdominal pain, nausea, vomiting, anorexia, diarrhea, or constipation. Neuro:  No headache, paresthesias, tremor, or muscle weakness. Psych:  No sadness, depression, crying, anxiety, panic, sleep disturbance, or suicidal or  homicidal ideation.  No hallucinations or delusions.  PMFSH:  Past medical history, family history, social history, meds, and allergies were reviewed.    Physical Exam:   Vital signs:  BP 117/88  Pulse 85  Temp(Src) 98.3 F (36.8 C) (Oral)  Resp 18  SpO2 100%  LMP 01/07/2013  Breastfeeding? No Gen:  Alert, oriented, in no distress. Lungs:  No respiratory distress.  Breath sounds clear and equal bilaterally.  No wheezes, rales, or rhonchi. Heart:  Regular rthythm.  No gallops, murmers, clicks or rubs. Abdomen:  Soft, flat and nontender.  No organomegaly or mass. Neuro:  Alert and oriented times 3. Speech clear, fluent and appropriate.  Cranial nerves intact.  No focal weakness. Psych:  Mood and affect normal.  Speech pattern normal.  Thought content normal with no suicidal or homicidal ideation.  No paranoia, hallucinations, or delusions.  Memory, insight, and judgement normal.  Assessment:  The primary encounter diagnosis was Panic attacks. A diagnosis of Postpartum depression was also pertinent to this visit.   She has good support from her husband. She does not have any thoughts of homicide or suicide. She'll need a followup with a primary care physician and a psychiatric provider. She has private insurance, so she was given the name of Dr. Maryelizabeth Rowan for a primary care doctor to followup on her mildly elevated blood pressure. I suggested she call the behavioral health number on her insurance card for referral to a behavioral health provider.  Plan:   1.  Meds:  The following meds were prescribed:   Discharge Medication List as of 01/14/2013  5:42 PM    START taking these medications   Details  FLUoxetine (PROZAC) 20 MG tablet Take 1 tablet (20 mg total) by mouth daily., Starting 01/14/2013, Until Discontinued, Normal    !! LORazepam (ATIVAN) 1 MG tablet Take 1 tablet (1 mg total) by mouth every 8 (eight) hours., Starting 01/14/2013, Until Discontinued, Print    !! LORazepam  (ATIVAN) 1 MG tablet Take 1 tablet (1 mg total) by mouth 3 (three) times daily as needed for anxiety., Starting 01/14/2013, Until Discontinued, Print     !! - Potential duplicate medications found. Please discuss with provider.      2.  Patient Education/Counseling:  The patient was given appropriate handouts, self care instructions, and instructed in symptomatic relief.   3.  Follow up:  The patient was told to follow up if no better in 3 to 4 days, if becoming worse in any way, and given some red flag symptoms such as worsening symptoms or suicidal ideation which would prompt immediate return.  Follow up with the behavioral health number on her insurance card and with Dr. Maryelizabeth Rowan for blood pressure.      Reuben Likes, MD 01/14/13 2202

## 2013-01-14 NOTE — Discharge Instructions (Signed)
Postpartum Depression and Baby Blues °The postpartum period begins right after the birth of a baby. During this time, there is often a great amount of joy and excitement. It is also a time of considerable changes in the life of the parent(s). Regardless of how many times a mother gives birth, each child brings new challenges and dynamics to the family. It is not unusual to have feelings of excitement accompanied by confusing shifts in moods, emotions, and thoughts. All mothers are at risk of developing postpartum depression or the "baby blues." These mood changes can occur right after giving birth, or they may occur many months after giving birth. The baby blues or postpartum depression can be mild or severe. Additionally, postpartum depression can resolve rather quickly, or it can be a long-term condition. °CAUSES °Elevated hormones and their rapid decline are thought to be a main cause of postpartum depression and the baby blues. There are a number of hormones that radically change during and after pregnancy. Estrogen and progesterone usually decrease immediately after delivering your baby. The level of thyroid hormone and various cortisol steroids also rapidly drop. Other factors that play a major role in these changes include major life events and genetics.  °RISK FACTORS °If you have any of the following risks for the baby blues or postpartum depression, know what symptoms to watch out for during the postpartum period. Risk factors that may increase the likelihood of getting the baby blues or postpartum depression include: °· Having a personal or family history of depression. °· Having depression while being pregnant. °· Having premenstrual or oral contraceptive-associated mood issues. °· Having exceptional life stress. °· Having marital conflict. °· Lacking a social support network. °· Having a baby with special needs. °· Having health problems such as diabetes. °SYMPTOMS °Baby blues symptoms include: °· Brief  fluctuations in mood, such as going from extreme happiness to sadness. °· Decreased concentration. °· Difficulty sleeping. °· Crying spells, tearfulness. °· Irritability. °· Anxiety. °Postpartum depression symptoms typically begin within the first month after giving birth. These symptoms include: °· Difficulty sleeping or excessive sleepiness. °· Marked weight loss. °· Agitation. °· Feelings of worthlessness. °· Lack of interest in activity or food. °Postpartum psychosis is a very concerning condition and can be dangerous. Fortunately, it is rare. Displaying any of the following symptoms is cause for immediate medical attention. Postpartum psychosis symptoms include: °· Hallucinations and delusions. °· Bizarre or disorganized behavior. °· Confusion or disorientation. °DIAGNOSIS  °A diagnosis is made by an evaluation of your symptoms. There are no medical or lab tests that lead to a diagnosis, but there are various questionnaires that a caregiver may use to identify those with the baby blues, postpartum depression, or psychosis. Often times, a screening tool called the Edinburgh Postnatal Depression Scale is used to diagnose depression in the postpartum period.  °TREATMENT °The baby blues usually goes away on its own in 1 to 2 weeks. Social support is often all that is needed. You should be encouraged to get adequate sleep and rest. Occasionally, you may be given medicines to help you sleep.  °Postpartum depression requires treatment as it can last several months or longer if it is not treated. Treatment may include individual or group therapy, medicine, or both to address any social, physiological, and psychological factors that may play a role in the depression. Regular exercise, a healthy diet, rest, and social support may also be strongly recommended.  °Postpartum psychosis is more serious and needs treatment right away. Hospitalization is   often needed. HOME CARE INSTRUCTIONS  Get as much rest as you can. Nap  when the baby sleeps.  Exercise regularly. Some women find yoga and walking to be beneficial.  Eat a balanced and nourishing diet.  Do little things that you enjoy. Have a cup of tea, take a bubble bath, read your favorite magazine, or listen to your favorite music.  Avoid alcohol.  Ask for help with household chores, cooking, grocery shopping, or running errands as needed. Do not try to do everything.  Talk to people close to you about how you are feeling. Get support from your partner, family members, friends, or other new moms.  Try to stay positive in how you think. Think about the things you are grateful for.  Do not spend a lot of time alone.  Only take medicine as directed by your caregiver.  Keep all your postpartum appointments.  Let your caregiver know if you have any concerns. SEEK MEDICAL CARE IF: You are having a reaction or problems with your medicine. SEEK IMMEDIATE MEDICAL CARE IF:  You have suicidal feelings.  You feel you may harm the baby or someone else. Document Released: 11/13/2003 Document Revised: 05/03/2011 Document Reviewed: 12/15/2010 [facility] Patient Information 2014 [place], [place].  Anxiety and Panic Attacks Your caregiver has informed you that you are having an anxiety or panic attack. There may be many forms of this. Most of the time these attacks come suddenly and without warning. They come at any time of day, including periods of sleep, and at any time of life. They may be strong and unexplained. Although panic attacks are very scary, they are physically harmless. Sometimes the cause of your anxiety is not known. Anxiety is a protective mechanism of the body in its fight or flight mechanism. Most of these perceived danger situations are actually nonphysical situations (such as anxiety over losing a job). CAUSES  The causes of an anxiety or panic attack are many. Panic attacks may occur in otherwise healthy people given a certain set of  circumstances. There may be a genetic cause for panic attacks. Some medications may also have anxiety as a side effect. SYMPTOMS  Some of the most common feelings are:  Intense terror.  Dizziness, feeling faint.  Hot and cold flashes.  Fear of going crazy.  Feelings that nothing is real.  Sweating.  Shaking.  Chest pain or a fast heartbeat (palpitations).  Smothering, choking sensations.  Feelings of impending doom and that death is near.  Tingling of extremities, this may be from over-breathing.  Altered reality (derealization).  Being detached from yourself (depersonalization). Several symptoms can be present to make up anxiety or panic attacks. DIAGNOSIS  The evaluation by your caregiver will depend on the type of symptoms you are experiencing. The diagnosis of anxiety or panic attack is made when no physical illness can be determined to be a cause of the symptoms. TREATMENT  Treatment to prevent anxiety and panic attacks may include:  Avoidance of circumstances that cause anxiety.  Reassurance and relaxation.  Regular exercise.  Relaxation therapies, such as yoga.  Psychotherapy with a psychiatrist or therapist.  Avoidance of caffeine, alcohol and illegal drugs.  Prescribed medication. SEEK IMMEDIATE MEDICAL CARE IF:   You experience panic attack symptoms that are different than your usual symptoms.  You have any worsening or concerning symptoms. Document Released: 02/08/2005 Document Revised: 05/03/2011 Document Reviewed: 09/22/2012 [facility] Patient Information 2014 [place], [place].   Call number on your [facility] card for  Behavioral Health.

## 2013-01-14 NOTE — ED Notes (Signed)
Pt is 5 months post-partum (not breastfeeding), describing anxiety and depression.  States, "One moment I am fine, and then 5 min later I am crying/screaming uncontrollably."  Denies homicidal or suicidal ideation, but describes intermittent sudden episodes of feeling panicky, lonely.  Pt talking non-stop during assessment.  Also c/o HTN with diastolic numbers 16-10 over past few days.  C/O difficulty sleeping.  Denies any pain.  Denies hx panic attacks.

## 2013-05-24 ENCOUNTER — Ambulatory Visit: Payer: Self-pay | Admitting: Ophthalmology

## 2013-12-18 DIAGNOSIS — H0589 Other disorders of orbit: Secondary | ICD-10-CM | POA: Insufficient documentation

## 2013-12-18 DIAGNOSIS — IMO0002 Reserved for concepts with insufficient information to code with codable children: Secondary | ICD-10-CM | POA: Insufficient documentation

## 2013-12-18 DIAGNOSIS — H532 Diplopia: Secondary | ICD-10-CM | POA: Insufficient documentation

## 2013-12-18 DIAGNOSIS — H04129 Dry eye syndrome of unspecified lacrimal gland: Secondary | ICD-10-CM | POA: Insufficient documentation

## 2013-12-18 DIAGNOSIS — E0500 Thyrotoxicosis with diffuse goiter without thyrotoxic crisis or storm: Secondary | ICD-10-CM | POA: Insufficient documentation

## 2013-12-24 ENCOUNTER — Encounter (HOSPITAL_COMMUNITY): Payer: Self-pay | Admitting: Emergency Medicine

## 2014-09-19 ENCOUNTER — Encounter: Payer: Self-pay | Admitting: *Deleted

## 2014-09-20 ENCOUNTER — Encounter: Payer: Self-pay | Admitting: Neurology

## 2014-09-20 ENCOUNTER — Ambulatory Visit (INDEPENDENT_AMBULATORY_CARE_PROVIDER_SITE_OTHER): Payer: 59 | Admitting: Neurology

## 2014-09-20 VITALS — BP 110/78 | HR 75 | Ht 65.0 in | Wt 164.2 lb

## 2014-09-20 DIAGNOSIS — E1142 Type 2 diabetes mellitus with diabetic polyneuropathy: Secondary | ICD-10-CM

## 2014-09-20 DIAGNOSIS — M792 Neuralgia and neuritis, unspecified: Secondary | ICD-10-CM

## 2014-09-20 DIAGNOSIS — H0520 Unspecified exophthalmos: Secondary | ICD-10-CM

## 2014-09-20 DIAGNOSIS — G629 Polyneuropathy, unspecified: Secondary | ICD-10-CM

## 2014-09-20 MED ORDER — NORTRIPTYLINE HCL 10 MG PO CAPS: ORAL_CAPSULE | ORAL | Status: DC

## 2014-09-20 MED ORDER — LIDOCAINE 5 % EX OINT: 1.0000 "application " | TOPICAL_OINTMENT | Freq: Three times a day (TID) | CUTANEOUS | Status: DC | PRN

## 2014-09-20 NOTE — Progress Notes (Signed)
Note sent

## 2014-09-20 NOTE — Progress Notes (Signed)
[facility] Clinic Note - Initial Visit   Date: 09/20/2014  Briana Fuentes MRN: [id number] DOB: 05-May-1975   Dear Dr. Ernie Hew:  Thank you for your kind referral of Briana Fuentes for consultation of bilateral feet pain. Although her history is well known to you, please allow [place] to reiterate it for the purpose of our medical record. The patient was accompanied to the clinic by self.    History of Present Illness: Briana Fuentes is a 39 y.o. right-handed [place] female with hypothyroidism, diabetes mellitus type 2, gout, and anxiety presenting for evaluation of bilateral feet pain.    Starting in 2015, she started having burning sensation of the balls of the feet. It is worse with light pressure such as wearing socks, bed sheets, or shoes.  She is unable to wear heals or any constrictive shoes.  She mostly walks barefoot at home.  Soaking in water alleviates the pain temporality.  No weakness or problems with balance.   She first saw Dr. Barkley Bruns, podiatrist, who injected for possible tarsal tunnel syndrome and also perform skin biopsy which was consistent with small fiber neuropathy.  She was then referred to neurologist, Dr. Trula Ore, in [facility] who performed NCS/EMG.  She was tried on gabapentin 300mg  TID and switched to Lyrica, but did not have any benefit.  She tried cymbalta but does not recall the dose.    Of note, she also has right exopthalmolous and was previously having double vision for which she was wearing prisms.  Double vision has nearly completely resolved.  She has seen a number of eye specialists who noted it is most likely due to thyroid disease and should resolve with time. She says that prior brain imaging was normal.  Out-side paper records, electronic medical record, and images have been reviewed where available and summarized as: Labs 08/05/2014:  TSH 0.548  Skin biopsy 10/15/2013:  Severely decreased intra-epidermal nerve fiber density consistent of  advanced small fiber neuropathy.   Lab Results  Component Value Date   HGBA1C 6.5* 11/13/2012   Lab Results  Component Value Date   TSH 1.103 11/13/2012    Past Medical History  Diagnosis Date  . Hypothyroidism   . Infertility, female   . Diabetes mellitus without complication     Gestational diabetes  . Gout   . Neuropathy     Past Surgical History  Procedure Laterality Date  . Thyroidectomy    . Cesarean section N/A 08/10/2012    Procedure: CESAREAN SECTION;  Surgeon: Mora Bellman, MD;  Location: [facility];  Service: Obstetrics;  Laterality: N/A;  Primary Cesarean Section Delivery Baby Girl @ 2126, Apgars 9/9     Medications:  Outpatient Encounter Prescriptions as of 09/20/2014  Medication Sig  . allopurinol (ZYLOPRIM) 100 MG tablet Take 100 mg by mouth daily.  [name] docusate sodium (COLACE) 100 MG capsule Take 100 mg by mouth 2 (two) times daily.  . DULoxetine (CYMBALTA) 60 MG capsule Take 60 mg by mouth daily.  [name] FLUoxetine (PROZAC) 20 MG tablet Take 1 tablet (20 mg total) by mouth daily.  [name] levothyroxine (SYNTHROID, LEVOTHROID) 125 MCG tablet Take 150 mcg by mouth daily.   [name] LORazepam (ATIVAN) 1 MG tablet Take 1 tablet (1 mg total) by mouth every 8 (eight) hours.  . metFORMIN (GLUCOPHAGE) 500 MG tablet Take 1,000 mg by mouth daily with breakfast.   . metFORMIN (GLUCOPHAGE-XR) 500 MG 24 hr tablet TAKE 1 TABLET BY MOUTH TWICE A DAY WITH MEALS  .  montelukast (SINGULAIR) 10 MG tablet Take 10 mg by mouth daily with supper.    No facility-administered encounter medications on file as of 09/20/2014.     Allergies: No Known Allergies  Family History: Family History  Problem Relation Age of Onset  . Heart disease Father   . Stroke Father     Social History: History  Substance Use Topics  . Smoking status: Never Smoker   . Smokeless tobacco: Never Used  . Alcohol Use: No   History   Social History Narrative    Review of Systems:  CONSTITUTIONAL: No fevers,  chills, night sweats, or weight loss.   EYES: No visual changes or eye pain ENT: No hearing changes.  No history of nose bleeds.   RESPIRATORY: No cough, wheezing and shortness of breath.   CARDIOVASCULAR: Negative for chest pain, and palpitations.   GI: Negative for abdominal discomfort, blood in stools or black stools.  No recent change in bowel habits.   GU:  No history of incontinence.   MUSCLOSKELETAL: No history of joint pain or swelling.  No myalgias.   SKIN: Negative for lesions, rash, and itching.   HEMATOLOGY/ONCOLOGY: Negative for prolonged bleeding, bruising easily, and swollen nodes.  No history of cancer.   ENDOCRINE: Negative for cold or heat intolerance, polydipsia or goiter.   PSYCH:  No depression or anxiety symptoms.   NEURO: As Above.   Vital Signs:  BP 110/78 mmHg  Pulse 75  Ht 5\' 5"  (1.651 m)  Wt 164 lb 4 oz (74.503 kg)  BMI 27.33 kg/m2  SpO2 99%   General Medical Exam:   General:  Well appearing, comfortable.   Eyes/ENT: see cranial nerve examination.   Neck: No masses appreciated.  Full range of motion without tenderness.  No carotid bruits. Respiratory:  Clear to auscultation, good air entry bilaterally.   Cardiac:  Regular rate and rhythm, no murmur.   Extremities:  No deformities, edema, or skin discoloration.  Skin:  No rashes or lesions.  Neurological Exam: MENTAL STATUS including orientation to time, place, person, recent and remote memory, attention span and concentration, language, and fund of knowledge is normal.  Speech is not dysarthric.  CRANIAL NERVES: II:  No visual field defects.  Unremarkable fundi.   III-IV-VI: Pupils equal round and reactive to light.  Normal conjugate, extra-ocular eye movements in all directions of gaze.  No nystagmus.  No ptosis.   Right proptosis. V:  Normal facial sensation.     VII:  Normal facial symmetry and movements.  No pathologic facial reflexes.  VIII:  Normal hearing and vestibular function.   IX-X:   Normal palatal movement.   XI:  Normal shoulder shrug and head rotation.   XII:  Normal tongue strength and range of motion, no deviation or fasciculation.  MOTOR:  No atrophy, fasciculations or abnormal movements.  No pronator drift.  Tone is normal.    Right Upper Extremity:    Left Upper Extremity:    Deltoid  5/5   Deltoid  5/5   Biceps  5/5   Biceps  5/5   Triceps  5/5   Triceps  5/5   Wrist extensors  5/5   Wrist extensors  5/5   Wrist flexors  5/5   Wrist flexors  5/5   Finger extensors  5/5   Finger extensors  5/5   Finger flexors  5/5   Finger flexors  5/5   Dorsal interossei  5/5   Dorsal interossei  5/5  Abductor pollicis  5/5   Abductor pollicis  5/5   Tone (Ashworth scale)  0  Tone (Ashworth scale)  0   Right Lower Extremity:    Left Lower Extremity:    Hip flexors  5/5   Hip flexors  5/5   Hip extensors  5/5   Hip extensors  5/5   Knee flexors  5/5   Knee flexors  5/5   Knee extensors  5/5   Knee extensors  5/5   Dorsiflexors  5/5   Dorsiflexors  5/5   Plantarflexors  5/5   Plantarflexors  5/5   Toe extensors  5/5   Toe extensors  5/5   Toe flexors  5/5   Toe flexors  5/5   Tone (Ashworth scale)  0  Tone (Ashworth scale)  0   MSRs:  Right                                                                 Left brachioradialis 2+  brachioradialis 2+  biceps 2+  biceps 2+  triceps 2+  triceps 2+  patellar 2+  patellar 2+  ankle jerk 2+  ankle jerk 2+  Hoffman no  Hoffman no  plantar response down  plantar response down   SENSORY: Hyperesthesia to pin prick over the distal sole of the feet. Otherwise, normal and symmetric perception of light touch, vibration, and proprioception.  Romberg's sign absent.   COORDINATION/GAIT: Normal finger-to- nose-finger and heel-to-shin.  Intact rapid alternating movements bilaterally.  Able to rise from a chair without using arms.  Gait narrow based and stable. Tandem and stressed gait intact.    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 Trula Ore where she has skin biopsy and EMG.  I do not have these records, but patient states that it disclosed mild neuropathy.  I will request for these records to be sent to me for review. She has been tried on suboptimal doses of neurontin (300mg  TID), cymbalta, and Lyrica.  We discussed trying to optimize a medication she already tried because she did not have any side effects vs. starting a new medication and she would like to try something else.  I will start her on nortripytyline 10mg  and titrate every two weeks.  Common side effects discussed. Start lidocaine ointment to feet to see if these helps  Encouraged her to maintain tight glycemic control as this is the only way to prevent progression of neuropathy  Exophthalmos, extensive work-up with opthalmology which I do not have.  She states she has had imaging of the face which was normal.  We will request these records   Return to clinic in 2 months   The duration of this appointment visit was 40 minutes of face-to-face time with the patient.  Greater than 50% of this time was spent in counseling, explanation of diagnosis, planning of further management, and coordination of care.   Thank you for allowing me to participate in patient's care.  If I can answer any additional questions, I would be pleased to do so.    Sincerely,    Donika K. Posey Pronto, DO

## 2014-09-20 NOTE — Patient Instructions (Signed)
1.  Please call and leave a message with my nurse the exact dose of medications you have tried and how much Lyrica you are taking 2.  We will start you on a new medication, nortriptyline 10mg .  Take as follows:  Take 1 tablet at bedtime x 2 weeks, then 2 tab at bedtime x 2 weeks, then 3 tab at bedtime x 2 weeks, then 4 tab at bedtime. If you develop any increased sleepiness or lightheadedness, stay at the lower dose 3.  You can also try lidocaine ointment to your feet. 4.  Return to clinic in 2 months

## 2014-11-26 ENCOUNTER — Other Ambulatory Visit: Payer: Self-pay | Admitting: Neurology

## 2014-11-26 NOTE — Telephone Encounter (Signed)
Rx sent 

## 2014-12-02 ENCOUNTER — Other Ambulatory Visit (INDEPENDENT_AMBULATORY_CARE_PROVIDER_SITE_OTHER): Payer: 59

## 2014-12-02 ENCOUNTER — Ambulatory Visit (INDEPENDENT_AMBULATORY_CARE_PROVIDER_SITE_OTHER): Payer: 59 | Admitting: Neurology

## 2014-12-02 ENCOUNTER — Encounter: Payer: Self-pay | Admitting: Neurology

## 2014-12-02 VITALS — BP 104/68 | HR 79 | Ht 65.0 in | Wt 159.6 lb

## 2014-12-02 DIAGNOSIS — M792 Neuralgia and neuritis, unspecified: Secondary | ICD-10-CM | POA: Diagnosis not present

## 2014-12-02 DIAGNOSIS — E1142 Type 2 diabetes mellitus with diabetic polyneuropathy: Secondary | ICD-10-CM

## 2014-12-02 LAB — SEDIMENTATION RATE: Sed Rate: 84 mm/hr — ABNORMAL HIGH (ref 0–22)

## 2014-12-02 LAB — C-REACTIVE PROTEIN: CRP: 1.2 mg/dL (ref 0.5–20.0)

## 2014-12-02 MED ORDER — PREGABALIN 100 MG PO CAPS: 100.0000 mg | ORAL_CAPSULE | Freq: Three times a day (TID) | ORAL | Status: DC

## 2014-12-02 NOTE — Progress Notes (Signed)
Note routed

## 2014-12-02 NOTE — Progress Notes (Signed)
Follow-up Visit   Date: 12/02/2014    Kathline Banbury MRN: [id number] DOB: 06-21-1975   Interim History: Briana Fuentes is a 39 y.o. right-handed [place] female with hypothyroidism, diabetes mellitus type 2, gout, and anxiety returning to the clinic for follow-up of diabetic neuropathy.  The patient was accompanied to the clinic by self.  History of present illness: Starting in 2015, she started having burning sensation of the balls of the feet. It is worse with light pressure such as wearing socks, bed sheets, or shoes. She is unable to wear heals or any constrictive shoes. She mostly walks barefoot at home. Soaking in water alleviates the pain temporality. No weakness or problems with balance. She first saw Dr. Barkley Bruns, podiatrist, who injected for possible tarsal tunnel syndrome and also perform skin biopsy which was consistent with small fiber neuropathy. She was then referred to neurologist, Dr. Trula Ore, in [facility] who performed NCS/EMG. She was tried on gabapentin 321m TID and switched to Lyrica, but did not have any benefit. She tried cymbalta but does not recall the dose.   Of note, she also has right exopthalmolous and was previously having double vision for which she was wearing prisms. Double vision has nearly completely resolved. She has seen a number of eye specialists who noted it is most likely due to thyroid disease and should resolve with time. She says that prior brain imaging was normal.  UPDATE 12/02/2014:  Pain remains uncontrolled and she feels that her burning pain is travelling up her legs.  She increased the nortriptyline to 353mbut developed GI side effects, so reduced it again to 2080maily.  She is also taking Lyrica 69m47mD for the past month, but again, does not notice any benefit.  She used lidocaine ointment which helps sometimes.    She is also worried about dry skin on her feet, calluses on her feet, and hair loss.  I explained that these are  unrelated to her neuropathy and he may want to discuss this with her PCP.    Medications:  Current Outpatient Prescriptions on File Prior to Visit  Medication Sig Dispense Refill  . docusate sodium (COLACE) 100 MG capsule Take 100 mg by mouth 2 (two) times daily.    . leMarland Kitchenothyroxine (SYNTHROID, LEVOTHROID) 125 MCG tablet Take 137.5 mcg by mouth daily.     . liMarland Kitchenocaine (XYLOCAINE) 5 % ointment APPLY TOPICALLY TO THE AFFECTED AREA(S) THREE TIMES DAILY AS NEEDED. USE GLOVES WHEN APPLYING 30 g 0  . metFORMIN (GLUCOPHAGE-XR) 500 MG 24 hr tablet TAKE 1 TABLET BY MOUTH TWICE A DAY WITH MEALS 60 tablet 0  . montelukast (SINGULAIR) 10 MG tablet Take 10 mg by mouth daily with supper.     . nortriptyline (PAMELOR) 10 MG capsule Take 1 tablet at bedtime x 2 weeks, then 2 tab at bedtime x 2 weeks, then 3 tab at bedtime x 2 weeks, then 4 tab at bedtime. 90 capsule 3   No current facility-administered medications on file prior to visit.    Allergies: No Known Allergies  Review of Systems:  CONSTITUTIONAL: No fevers, chills, night sweats, or weight loss.  EYES: No visual changes or eye pain ENT: No hearing changes.  No history of nose bleeds.   RESPIRATORY: No cough, wheezing and shortness of breath.   CARDIOVASCULAR: Negative for chest pain, and palpitations.   GI: Negative for abdominal discomfort, blood in stools or black stools.  No recent change in bowel habits.   GU:  No history of incontinence.   MUSCLOSKELETAL: No history of joint pain or swelling.  No myalgias.   SKIN: Negative for lesions, rash, and itching.   ENDOCRINE: Negative for cold or heat intolerance, polydipsia or goiter.   PSYCH:  No depression +anxiety symptoms.   NEURO: As Above.   Vital Signs:  BP 104/68 mmHg  Pulse 79  Ht 5' 5"  (1.651 m)  Wt 159 lb 9 oz (72.377 kg)  BMI 26.55 kg/m2  SpO2 99%  Neurological Exam: MENTAL STATUS including orientation to time, place, person, recent and remote memory, attention span and  concentration, language, and fund of knowledge is normal.  Speech is not dysarthric.  CRANIAL NERVES:  Bilateral exopthalomos.  Face is symmetric.   MOTOR:  Motor strength is 5/5 in all extremities.  MSRs:  Reflexes are 2+/4 throughout.  SENSORY:   Hyperesthesia to pin prick over the distal sole of the feet  COORDINATION/GAIT:  Normal finger-to- nose-finger and heel-to-shin.  Intact rapid alternating movements bilaterally.  Gait narrow based and stable.   Data: Labs 08/05/2014: TSH 0.548  Skin biopsy 10/15/2013: Severely decreased intra-epidermal nerve fiber density consistent of advanced small fiber neuropathy.   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 Trula Ore where she has skin biopsy and EMG.She has been tried on suboptimal doses of neurontin (387m TID), cymbalta, and Lyrica. Increase Lyrica to 1036mthree times daily.  She will call with update in 1 month, if tolerating, increase to 20049mID.  Continue nortriptyline 95m43m bedtime Continue lidocaine ointment which seems to help Check vitamin B12, vitamin B1, celiac panel, ESR, CRP, ENA, ANA Offered to repeat EMG due to worsening pain, but she declined  She is asking about treatment for hair loss and callused feet which is better addressed by her PCP  Return to clinic in 3 months   The duration of this appointment visit was 25 minutes of face-to-face time with the patient.  Greater than 50% of this time was spent in counseling, explanation of diagnosis, planning of further management, and coordination of care.   Thank you for allowing me to participate in patient's care.  If I can answer any additional questions, I would be pleased to do so.    Sincerely,    Donika K. PatePosey Pronto

## 2014-12-02 NOTE — Patient Instructions (Addendum)
1.  Increase Lyrica to 100mg  three times daily 2.  Continue nortriptyline 20mg  at bedtime 3.  Check blood work 4.  Call within an update 1 month  Return to clinic in 3 months

## 2014-12-03 LAB — ENA 9 PANEL
Centromere Ab Screen: <1
ENA SM Ab Ser-aCnc: <1
Jo-1 Antibody, IgG: <1
Ribosomal P Protein Ab: <1
SM/RNP: <1
SSA (Ro) (ENA) Antibody, IgG: <1
SSB (La) (ENA) Antibody, IgG: <1
Scleroderma (Scl-70) (ENA) Antibody, IgG: <1
ds DNA Ab: <1 IU/mL

## 2014-12-03 LAB — TISSUE TRANSGLUTAMINASE, IGA: Tissue Transglutaminase Ab, IgA: 1 U/mL (ref <4)

## 2014-12-03 LAB — RETICULIN ANTIBODIES, IGA W TITER: Reticulin Ab, IgA: NEGATIVE

## 2014-12-03 LAB — VITAMIN B12: Vitamin B-12: >1500 pg/mL — ABNORMAL HIGH (ref 211–911)

## 2014-12-03 LAB — ANA: Anti Nuclear Antibody(ANA): NEGATIVE

## 2014-12-03 LAB — GLIADIN ANTIBODIES, SERUM
Gliadin IgA: 17 Units (ref <20)
Gliadin IgG: 1 Units (ref <20)

## 2014-12-06 LAB — VITAMIN B1: Vitamin B1 (Thiamine): 10 nmol/L (ref 8–30)

## 2015-01-25 ENCOUNTER — Emergency Department (HOSPITAL_COMMUNITY)
Admission: EM | Admit: 2015-01-25 | Discharge: 2015-01-25 | Disposition: A | Discharge status: Home / Self Care | Payer: 59 | Source: Home / Self Care | Attending: Family Medicine | Admitting: Family Medicine

## 2015-01-25 ENCOUNTER — Encounter (HOSPITAL_COMMUNITY): Payer: Self-pay | Admitting: *Deleted

## 2015-01-25 DIAGNOSIS — M7741 Metatarsalgia, right foot: Secondary | ICD-10-CM | POA: Diagnosis not present

## 2015-01-25 DIAGNOSIS — M7742 Metatarsalgia, left foot: Secondary | ICD-10-CM

## 2015-01-25 MED ORDER — NAPROXEN 500 MG PO TABS: 500.0000 mg | ORAL_TABLET | Freq: Two times a day (BID) | ORAL | Status: DC

## 2015-01-25 NOTE — ED Notes (Signed)
Pt     Reports    Pain in  The  Soles of  Her  Feet         X     5  Days   denys  Any  Injury  Pain is  Worse  On  Weight  Bearing

## 2015-01-25 NOTE — Discharge Instructions (Signed)
Use ice and medicine and change to [facility], or [place] or [employer].

## 2015-01-25 NOTE — ED Provider Notes (Signed)
CSN: [id number]     Arrival date & time 01/25/15  [id number] History   First MD Initiated Contact with Patient 01/25/15 1857     Chief Complaint  Patient presents with  . Foot Pain   (Consider location/radiation/quality/duration/timing/severity/associated sxs/prior Treatment) Patient is a 39 y.o. female presenting with lower extremity pain. The history is provided by the patient and the spouse.  Foot Pain This is a new problem. The current episode started more than 2 days ago. The problem has not changed since onset.Pertinent negatives include no chest pain and no abdominal pain. The symptoms are aggravated by walking (appears to be walking on backs of both shoes, no support or shoe structure.).    Past Medical History  Diagnosis Date  . Hypothyroidism   . Infertility, female   . Diabetes mellitus without complication (HCC)     Gestational diabetes  . Gout   . Neuropathy [facility])    Past Surgical History  Procedure Laterality Date  . Thyroidectomy    . Cesarean section N/A 08/10/2012    Procedure: CESAREAN SECTION;  Surgeon: Mora Bellman, MD;  Location: [facility];  Service: Obstetrics;  Laterality: N/A;  Primary Cesarean Section Delivery Baby Girl @ 2126, Apgars 9/9   Family History  Problem Relation Age of Onset  . Heart disease Father   . Stroke Father   . Diabetes Mellitus II Father   . Kidney failure Father     on dialysis  . Healthy Mother   . Healthy Brother   . Healthy Sister   . Healthy Daughter   . Healthy Son    Social History  Substance Use Topics  . Smoking status: Never Smoker   . Smokeless tobacco: Never Used  . Alcohol Use: No   OB History    Gravida Para Term Preterm AB TAB SAB Ectopic Multiple Living   3 3 1 2  0 0 0 0 0 2     Review of Systems  Cardiovascular: Negative for chest pain.  Gastrointestinal: Negative for abdominal pain.  Musculoskeletal: Positive for myalgias and gait problem. Negative for back pain and joint swelling.  Skin: Negative.   All  other systems reviewed and are negative.   Allergies  Review of patient's allergies indicates no known allergies.  Home Medications   Prior to Admission medications   Medication Sig Start Date End Date Taking? Authorizing Provider  docusate sodium (COLACE) 100 MG capsule Take 100 mg by mouth 2 (two) times daily.    Historical Provider, MD  Payton Mccallum ADM 0.5ML IM UTD 11/12/14   Historical Provider, MD  levothyroxine (SYNTHROID, LEVOTHROID) 125 MCG tablet Take 137.5 mcg by mouth daily.     Historical Provider, MD  lidocaine (XYLOCAINE) 5 % ointment APPLY TOPICALLY TO THE AFFECTED AREA(S) THREE TIMES DAILY AS NEEDED. USE GLOVES WHEN APPLYING 11/26/14   Alda Berthold, DO  lidocaine (XYLOCAINE) 5 % ointment  11/26/14   Historical Provider, MD  metFORMIN (GLUCOPHAGE-XR) 500 MG 24 hr tablet TAKE 1 TABLET BY MOUTH TWICE A DAY WITH MEALS 12/31/12   [name] Constant, MD  metFORMIN (GLUCOPHAGE-XR) 500 MG 24 hr tablet  11/21/14   Historical Provider, MD  montelukast (SINGULAIR) 10 MG tablet Take 10 mg by mouth daily with supper.     Historical Provider, MD  naproxen (NAPROSYN) 500 MG tablet Take 1 tablet (500 mg total) by mouth 2 (two) times daily. 01/25/15   Billy Fischer, MD  nortriptyline (PAMELOR) 10 MG capsule Take 1 tablet at bedtime  x 2 weeks, then 2 tab at bedtime x 2 weeks, then 3 tab at bedtime x 2 weeks, then 4 tab at bedtime. 09/20/14   Donika K Patel, DO  pregabalin (LYRICA) 100 MG capsule Take 1 capsule (100 mg total) by mouth 3 (three) times daily. 12/02/14   Donika Keith Rake, DO  traMADol (ULTRAM) 50 MG tablet TK 1 T PO 2-3 TIMES DAILY PRN 10/10/14   Historical Provider, MD   Meds Ordered and Administered this Visit  Medications - No data to display  BP 112/82 mmHg  Pulse 70  Temp(Src) 98.7 F (37.1 C) (Oral)  SpO2 98% No data found.   Physical Exam  Constitutional: She is oriented to person, place, and time. She appears well-developed and well-nourished. She appears distressed.    Musculoskeletal: She exhibits tenderness.       Right foot: There is tenderness and bony tenderness. There is no swelling.       Feet:  Neurological: She is alert and oriented to person, place, and time.  Skin: Skin is warm and dry.  Nursing note and vitals reviewed.   ED Course  Procedures (including critical care time)  Labs Review Labs Reviewed - No data to display  Imaging Review No results found.   Visual Acuity Review  Right Eye Distance:   Left Eye Distance:   Bilateral Distance:    Right Eye Near:   Left Eye Near:    Bilateral Near:         MDM   1. Metatarsalgia of both feet        Billy Fischer, MD 01/25/15 1919

## 2015-03-14 ENCOUNTER — Ambulatory Visit: Payer: 59 | Admitting: Neurology

## 2015-06-20 ENCOUNTER — Ambulatory Visit: Payer: BLUE CROSS/BLUE SHIELD | Admitting: Podiatry

## 2015-07-02 ENCOUNTER — Encounter: Payer: Self-pay | Admitting: Podiatry

## 2015-07-02 ENCOUNTER — Ambulatory Visit (INDEPENDENT_AMBULATORY_CARE_PROVIDER_SITE_OTHER): Payer: BLUE CROSS/BLUE SHIELD

## 2015-07-02 ENCOUNTER — Ambulatory Visit (INDEPENDENT_AMBULATORY_CARE_PROVIDER_SITE_OTHER): Payer: BLUE CROSS/BLUE SHIELD | Admitting: Podiatry

## 2015-07-02 VITALS — BP 109/74 | HR 73 | Resp 16

## 2015-07-02 DIAGNOSIS — M779 Enthesopathy, unspecified: Secondary | ICD-10-CM | POA: Diagnosis not present

## 2015-07-02 MED ORDER — TRIAMCINOLONE ACETONIDE 10 MG/ML IJ SUSP
10.0000 mg | Freq: Once | INTRAMUSCULAR | Status: AC
  Administered 2015-07-02: 10 mg

## 2015-07-02 NOTE — Progress Notes (Signed)
Subjective:     Patient ID: Briana Fuentes, female   DOB: 1976-01-28, 40 y.o.   MRN: [id number]  HPI patient presents with a lot of pain in both feet and states that she's had previous nerve biopsies done which showed disease and that she not only has pain but she also gets burning pain at night. She is taking medications which only helps to a small degree   Review of Systems  All other systems reviewed and are negative.      Objective:   Physical Exam  Constitutional: She is oriented to person, place, and time.  Cardiovascular: Intact distal pulses.   Musculoskeletal: Normal range of motion.  Neurological: She is oriented to person, place, and time.  Skin: Skin is warm.  Nursing note and vitals reviewed.  neurovascular status intact muscle strength adequate range of motion was within normal limits with patient noted to have no loss of sharp dull or vibratory currently. I did note that most of the pain appears to be in the metatarsal phalangeal joints 2 and 3 left over right with fluid buildup around the joint surfaces. There was mild digital deformities left over right and I was unable to figure out any other form of pathological process     Assessment:     Probability for some form of idiopathic neuropathy but also possibility for inflammatory capsulitis    Plan:     H&P and conditions reviewed with patient. At this point I did a proximal nerve block left I then aspirated the second and third MPJs giving out a small amount of clear fluid and injected with a quarter cc of dexamethasone Kenalog and each joint and applied padding. I want to see results and she will also get the notes for previous Dr. and she will continue to take her Lyrica until we reevaluate her  Rays were negative for signs of arthritis or stress fracture

## 2015-07-02 NOTE — Progress Notes (Signed)
   Subjective:    Patient ID: Briana Fuentes, female    DOB: 05-11-1975, 40 y.o.   MRN: [id number]  HPI  Chief Complaint  Patient presents with  . Foot Pain    Forefoot bilateral - sharp sensations for several months, initially started in the big toes, PCP Rx'd Lyrica, helps some, went to see Rheumatologist said no arthritis-gave steriod that increase glucose levels, had a nerve studies done-said may be neuropathy-Rx'd gabapentin first before Lyrica, a friend recommended she come here for treatment-pain is getting worse and can't sleep at night  . Diabetes    Last A1C was "6.something"       Review of Systems  Constitutional: Positive for fatigue.  All other systems reviewed and are negative.      Objective:   Physical Exam        Assessment & Plan:

## 2015-07-16 ENCOUNTER — Ambulatory Visit: Payer: BLUE CROSS/BLUE SHIELD | Admitting: Podiatry

## 2015-07-25 ENCOUNTER — Ambulatory Visit (INDEPENDENT_AMBULATORY_CARE_PROVIDER_SITE_OTHER): Payer: BLUE CROSS/BLUE SHIELD | Admitting: Podiatry

## 2015-07-25 ENCOUNTER — Encounter: Payer: Self-pay | Admitting: Podiatry

## 2015-07-25 VITALS — BP 108/62 | HR 83 | Resp 12

## 2015-07-25 DIAGNOSIS — M779 Enthesopathy, unspecified: Secondary | ICD-10-CM | POA: Diagnosis not present

## 2015-07-25 DIAGNOSIS — G629 Polyneuropathy, unspecified: Secondary | ICD-10-CM

## 2015-07-25 MED ORDER — OXYCODONE-ACETAMINOPHEN 10-325 MG PO TABS: 1.0000 | ORAL_TABLET | Freq: Three times a day (TID) | ORAL | Status: DC | PRN

## 2015-07-28 NOTE — Progress Notes (Signed)
Subjective:     Patient ID: Briana Fuentes, female   DOB: [birth year], 40 y.o.   MRN: [id number]  HPI patient presents stating she still having pain in her foot and she simply at a loss to what to do   Review of Systems     Objective:   Physical Exam Neurovascular status intact muscle strength adequate with continued discomfort in the left lesser metatarsophalangeal joints over right with mild neuropathic symptoms and history of seen a number of doctors for this condition    Assessment:     Inflammatory change consistent with unknown idiopathic neuropathic condition with possibilities that there may be inflammatory component    Plan:     I do not recommend other treatments as she has not responded to numerous conservative care treatments and at this point she is discharged from services

## 2015-08-12 DIAGNOSIS — M79671 Pain in right foot: Secondary | ICD-10-CM | POA: Diagnosis not present

## 2015-08-12 DIAGNOSIS — E1140 Type 2 diabetes mellitus with diabetic neuropathy, unspecified: Secondary | ICD-10-CM | POA: Diagnosis not present

## 2015-08-12 DIAGNOSIS — E1165 Type 2 diabetes mellitus with hyperglycemia: Secondary | ICD-10-CM | POA: Diagnosis not present

## 2015-08-12 DIAGNOSIS — E559 Vitamin D deficiency, unspecified: Secondary | ICD-10-CM | POA: Diagnosis not present

## 2015-08-12 DIAGNOSIS — Z6828 Body mass index (BMI) 28.0-28.9, adult: Secondary | ICD-10-CM | POA: Diagnosis not present

## 2015-08-12 DIAGNOSIS — E039 Hypothyroidism, unspecified: Secondary | ICD-10-CM | POA: Diagnosis not present

## 2015-08-12 DIAGNOSIS — G609 Hereditary and idiopathic neuropathy, unspecified: Secondary | ICD-10-CM | POA: Diagnosis not present

## 2015-08-13 DIAGNOSIS — M109 Gout, unspecified: Secondary | ICD-10-CM | POA: Diagnosis not present

## 2015-09-24 DIAGNOSIS — M109 Gout, unspecified: Secondary | ICD-10-CM | POA: Diagnosis not present

## 2015-10-31 DIAGNOSIS — E1165 Type 2 diabetes mellitus with hyperglycemia: Secondary | ICD-10-CM | POA: Diagnosis not present

## 2015-10-31 DIAGNOSIS — E559 Vitamin D deficiency, unspecified: Secondary | ICD-10-CM | POA: Diagnosis not present

## 2015-10-31 DIAGNOSIS — E039 Hypothyroidism, unspecified: Secondary | ICD-10-CM | POA: Diagnosis not present

## 2015-11-03 DIAGNOSIS — G609 Hereditary and idiopathic neuropathy, unspecified: Secondary | ICD-10-CM | POA: Diagnosis not present

## 2015-11-03 DIAGNOSIS — E559 Vitamin D deficiency, unspecified: Secondary | ICD-10-CM | POA: Diagnosis not present

## 2015-11-03 DIAGNOSIS — E1165 Type 2 diabetes mellitus with hyperglycemia: Secondary | ICD-10-CM | POA: Diagnosis not present

## 2015-11-03 DIAGNOSIS — E039 Hypothyroidism, unspecified: Secondary | ICD-10-CM | POA: Diagnosis not present

## 2015-11-06 ENCOUNTER — Encounter (HOSPITAL_COMMUNITY): Payer: Self-pay | Admitting: *Deleted

## 2015-11-06 ENCOUNTER — Inpatient Hospital Stay (HOSPITAL_COMMUNITY): Payer: BLUE CROSS/BLUE SHIELD

## 2015-11-06 ENCOUNTER — Inpatient Hospital Stay (HOSPITAL_COMMUNITY)
Admission: AD | Admit: 2015-11-06 | Discharge: 2015-11-07 | Disposition: A | Discharge status: Home / Self Care | Payer: BLUE CROSS/BLUE SHIELD | Source: Ambulatory Visit | Attending: Obstetrics & Gynecology | Admitting: Obstetrics & Gynecology

## 2015-11-06 DIAGNOSIS — O24415 Gestational diabetes mellitus in pregnancy, controlled by oral hypoglycemic drugs: Secondary | ICD-10-CM | POA: Insufficient documentation

## 2015-11-06 DIAGNOSIS — O209 Hemorrhage in early pregnancy, unspecified: Secondary | ICD-10-CM | POA: Diagnosis not present

## 2015-11-06 DIAGNOSIS — Z3A09 9 weeks gestation of pregnancy: Secondary | ICD-10-CM | POA: Diagnosis not present

## 2015-11-06 DIAGNOSIS — D259 Leiomyoma of uterus, unspecified: Secondary | ICD-10-CM

## 2015-11-06 DIAGNOSIS — O3411 Maternal care for benign tumor of corpus uteri, first trimester: Secondary | ICD-10-CM | POA: Diagnosis not present

## 2015-11-06 DIAGNOSIS — O200 Threatened abortion: Secondary | ICD-10-CM

## 2015-11-06 DIAGNOSIS — Z3491 Encounter for supervision of normal pregnancy, unspecified, first trimester: Secondary | ICD-10-CM

## 2015-11-06 DIAGNOSIS — O3410 Maternal care for benign tumor of corpus uteri, unspecified trimester: Secondary | ICD-10-CM

## 2015-11-06 DIAGNOSIS — Z3A01 Less than 8 weeks gestation of pregnancy: Secondary | ICD-10-CM | POA: Diagnosis not present

## 2015-11-06 LAB — HCG, QUANTITATIVE, PREGNANCY: hCG, Beta Chain, Quant, S: 9009 m[IU]/mL — ABNORMAL HIGH (ref <5)

## 2015-11-06 LAB — URINE MICROSCOPIC-ADD ON

## 2015-11-06 LAB — URINALYSIS, ROUTINE W REFLEX MICROSCOPIC
Bilirubin Urine: NEGATIVE
Glucose, UA: NEGATIVE mg/dL
Ketones, ur: 15 mg/dL — AB
Nitrite: NEGATIVE
Protein, ur: NEGATIVE mg/dL
Specific Gravity, Urine: 1.01 (ref 1.005–1.030)
pH: 6 (ref 5.0–8.0)

## 2015-11-06 LAB — WET PREP, GENITAL
Clue Cells Wet Prep HPF POC: NONE SEEN
Sperm: NONE SEEN
Trich, Wet Prep: NONE SEEN
Yeast Wet Prep HPF POC: NONE SEEN

## 2015-11-06 LAB — CBC
HCT: 33.1 % — ABNORMAL LOW (ref 36.0–46.0)
Hemoglobin: 10.9 g/dL — ABNORMAL LOW (ref 12.0–15.0)
MCH: 26.5 pg (ref 26.0–34.0)
MCHC: 32.9 g/dL (ref 30.0–36.0)
MCV: 80.3 fL (ref 78.0–100.0)
Platelets: 335 10*3/uL (ref 150–400)
RBC: 4.12 MIL/uL (ref 3.87–5.11)
RDW: 14.3 % (ref 11.5–15.5)
WBC: 13.1 10*3/uL — ABNORMAL HIGH (ref 4.0–10.5)

## 2015-11-06 LAB — POCT PREGNANCY, URINE: Preg Test, Ur: POSITIVE — AB

## 2015-11-06 NOTE — MAU Note (Signed)
PT  SAYS SHE DID    3   HPT-    POSITIVE.     SAYS  YESTERDAY   STARTED HAVING VAG BLEEDING   WHEN  SHE WIPED.     HAS  AN APPOINTMENT  ON   9-20  WITH  [facility].    NO PAIN /   NO CRAMPS.

## 2015-11-06 NOTE — MAU Provider Note (Signed)
History     CSN: [id number]  Arrival date and time: 11/06/15 2102   First Provider Initiated Contact with Patient 11/06/15 2218      Chief Complaint  Patient presents with  . Vaginal Bleeding   HPI Briana Fuentes is a 40 y.o. [id number] at [redacted]w[redacted]d who presents with vaginal bleeding. Reports spotting on toilet paper since last Fuentes x 3 episodes. Also passed very small dark red clot. Spotting was pink but is not dark red/brown. Denies abdominal pain, n/v, constipation, dysuria, or recent intercourse. Does reports diarrhea that she said is d/t metformin.   OB History    Gravida Para Term Preterm AB Living   4 3 1 2  0 2   SAB TAB Ectopic Multiple Live Births   0 0 0 0 2      Past Medical History:  Diagnosis Date  . Diabetes mellitus without complication (HCC)    Gestational diabetes  . Gout   . Hypothyroidism   . Infertility, female   . Neuropathy [facility])     Past Surgical History:  Procedure Laterality Date  . CESAREAN SECTION N/A 08/10/2012   Procedure: CESAREAN SECTION;  Surgeon: Mora Bellman, MD;  Location: [facility];  Service: Obstetrics;  Laterality: N/A;  Primary Cesarean Section Delivery Baby Girl @ 2126, Apgars 9/9  . THYROIDECTOMY      Family History  Problem Relation Age of Onset  . Heart disease Father   . Stroke Father   . Diabetes Mellitus II Father   . Kidney failure Father     on dialysis  . Healthy Mother   . Healthy Brother   . Healthy Sister   . Healthy Daughter   . Healthy Son     Social History  Substance Use Topics  . Smoking status: Never Smoker  . Smokeless tobacco: Never Used  . Alcohol use No    Allergies: No Known Allergies  Prescriptions Prior to Admission  Medication Sig Dispense Refill Last Dose  . levothyroxine (SYNTHROID, LEVOTHROID) 150 MCG tablet Take 150 mcg by mouth daily before breakfast.   11/05/2015 at Unknown time  . metFORMIN (GLUCOPHAGE-XR) 500 MG 24 hr tablet TAKE 1 TABLET BY MOUTH TWICE A DAY WITH MEALS 60 tablet 0 11/06/2015  at Unknown time  . LEVEMIR FLEXTOUCH 100 UNIT/ML Pen   5   . nortriptyline (PAMELOR) 10 MG capsule Take 1 tablet at bedtime x 2 weeks, then 2 tab at bedtime x 2 weeks, then 3 tab at bedtime x 2 weeks, then 4 tab at bedtime. (Patient not taking: Reported on 11/06/2015) 90 capsule 3 Not Taking at Unknown time  . oxyCODONE-acetaminophen (PERCOCET) 10-325 MG tablet Take 1 tablet by mouth every 8 (eight) hours as needed for pain. (Patient not taking: Reported on 11/06/2015) 30 tablet 0 More than a month at Unknown time    Review of Systems  Constitutional: Negative.   Gastrointestinal: Positive for diarrhea. Negative for abdominal pain, blood in stool, constipation, nausea and vomiting.  Genitourinary: Negative for dysuria.       + vaginal bleeding   Physical Exam   Blood pressure 122/69, pulse 79, temperature 98.8 F (37.1 C), temperature source Oral, resp. rate 16, height 5\' 5"  (1.651 m), weight 158 lb (71.7 kg), last menstrual period 09/03/2015, SpO2 100 %.  Physical Exam  Nursing note and vitals reviewed. Constitutional: She is oriented to person, place, and time. She appears well-developed and well-nourished. No distress.  HENT:  Head: Normocephalic and atraumatic.  Eyes: Conjunctivae are  normal. Right eye exhibits no discharge. Left eye exhibits no discharge. No scleral icterus.  Neck: Normal range of motion.  Respiratory: Effort normal. No respiratory distress.  GI: Soft. She exhibits no distension. There is no tenderness. There is no rebound and no guarding.  Genitourinary: Uterus normal. Cervix exhibits no motion tenderness. Right adnexum displays no mass and no tenderness. Left adnexum displays no mass and no tenderness. There is bleeding (minimal amount of dark red blood) in the vagina. Vaginal discharge (minimal amount of yellow mucoid discharge) found.  Genitourinary Comments: Cervix closed  Neurological: She is alert and oriented to person, place, and time.  Skin: Skin is warm and  dry. She is not diaphoretic.  Psychiatric: She has a normal mood and affect. Her behavior is normal. Judgment and thought content normal.    MAU Course  Procedures Results for orders placed or performed during the hospital encounter of 11/06/15 (from the past 24 hour(s))  Urinalysis, Routine w reflex microscopic (not at [facility])     Status: Abnormal   Collection Time: 11/06/15  9:28 PM  Result Value Ref Range   Color, Urine YELLOW YELLOW   APPearance CLEAR CLEAR   Specific Gravity, Urine 1.010 1.005 - 1.030   pH 6.0 5.0 - 8.0   Glucose, UA NEGATIVE NEGATIVE mg/dL   Hgb urine dipstick LARGE (A) NEGATIVE   Bilirubin Urine NEGATIVE NEGATIVE   Ketones, ur 15 (A) NEGATIVE mg/dL   Protein, ur NEGATIVE NEGATIVE mg/dL   Nitrite NEGATIVE NEGATIVE   Leukocytes, UA SMALL (A) NEGATIVE  Urine microscopic-add on     Status: Abnormal   Collection Time: 11/06/15  9:28 PM  Result Value Ref Range   Squamous Epithelial / LPF 0-5 (A) NONE SEEN   WBC, UA 6-30 0 - 5 WBC/hpf   RBC / HPF 0-5 0 - 5 RBC/hpf   Bacteria, UA FEW (A) NONE SEEN   Urine-Other MUCOUS PRESENT   Pregnancy, urine POC     Status: Abnormal   Collection Time: 11/06/15  9:34 PM  Result Value Ref Range   Preg Test, Ur POSITIVE (A) NEGATIVE  CBC     Status: Abnormal   Collection Time: 11/06/15 10:24 PM  Result Value Ref Range   WBC 13.1 (H) 4.0 - 10.5 K/uL   RBC 4.12 3.87 - 5.11 MIL/uL   Hemoglobin 10.9 (L) 12.0 - 15.0 g/dL   HCT 33.1 (L) 36.0 - 46.0 %   MCV 80.3 78.0 - 100.0 fL   MCH 26.5 26.0 - 34.0 pg   MCHC 32.9 30.0 - 36.0 g/dL   RDW 14.3 11.5 - 15.5 %   Platelets 335 150 - 400 K/uL  hCG, quantitative, pregnancy     Status: Abnormal   Collection Time: 11/06/15 10:24 PM  Result Value Ref Range   hCG, Beta Chain, Quant, S 9,009 (H) <5 mIU/mL  Wet prep, genital     Status: Abnormal   Collection Time: 11/06/15 10:30 PM  Result Value Ref Range   Yeast Wet Prep HPF POC NONE SEEN NONE SEEN   Trich, Wet Prep NONE SEEN NONE SEEN    Clue Cells Wet Prep HPF POC NONE SEEN NONE SEEN   WBC, Wet Prep HPF POC MANY (A) NONE SEEN   Sperm NONE SEEN    US Ob Comp Less 14 Wks  Result Date: 11/06/2015 CLINICAL DATA:  Pregnant patient in first-trimester pregnancy with vaginal bleeding. EXAM: OBSTETRIC <14 WK [place] AND TRANSVAGINAL OB US TECHNIQUE: Both transabdominal and transvaginal ultrasound  examinations were performed for complete evaluation of the gestation as well as the maternal uterus, adnexal regions, and pelvic cul-de-sac. Transvaginal technique was performed to assess early pregnancy. COMPARISON:  None. FINDINGS: Intrauterine gestational sac: Single Yolk sac:  Present. Embryo:  Not visualized. Cardiac Activity: Not visualized. MSD: 12.3  mm   6 w   0  d Subchorionic hemorrhage:  None visualized. Maternal uterus/adnexae: Uterine fibroid in the right mid uterus measures 4.1 x 3.5 x 3.6 cm. The right ovary is normal. The left ovary is normal and contains a corpus luteal cyst. There is no pelvic free fluid. IMPRESSION: Intrauterine gestational sac containing a yolk sac but no fetal pole or cardiac activity yet demonstrated. Recommend follow-up quantitative B-HCG levels and follow-up US in 10 days to confirm and assess viability. Right uterine fibroid. Electronically Signed   By: Jeb Levering M.D.   On: 11/06/2015 23:49   US Ob Transvaginal  Result Date: 11/06/2015 CLINICAL DATA:  Pregnant patient in first-trimester pregnancy with vaginal bleeding. EXAM: OBSTETRIC <14 WK [place] AND TRANSVAGINAL OB US TECHNIQUE: Both transabdominal and transvaginal ultrasound examinations were performed for complete evaluation of the gestation as well as the maternal uterus, adnexal regions, and pelvic cul-de-sac. Transvaginal technique was performed to assess early pregnancy. COMPARISON:  None. FINDINGS: Intrauterine gestational sac: Single Yolk sac:  Present. Embryo:  Not visualized. Cardiac Activity: Not visualized. MSD: 12.3  mm   6 w   0  d Subchorionic  hemorrhage:  None visualized. Maternal uterus/adnexae: Uterine fibroid in the right mid uterus measures 4.1 x 3.5 x 3.6 cm. The right ovary is normal. The left ovary is normal and contains a corpus luteal cyst. There is no pelvic free fluid. IMPRESSION: Intrauterine gestational sac containing a yolk sac but no fetal pole or cardiac activity yet demonstrated. Recommend follow-up quantitative B-HCG levels and follow-up US in 10 days to confirm and assess viability. Right uterine fibroid. Electronically Signed   By: Jeb Levering M.D.   On: 11/06/2015 23:49     MDM +UPT O positive UA, wet prep, GC/chlamydia, CBC, quant hCG, HIV, and [place] today to rule out ectopic pregnancy Ultrasound shows IUGS with yolk sac, no [place]  Assessment and Plan  A: 1. Normal IUP (intrauterine pregnancy) on prenatal ultrasound, first trimester   2. Vaginal bleeding in pregnancy, first trimester   3. Threatened miscarriage   4. Uterine fibroid during pregnancy, antepartum     P: Discharge home Pelvic rest Discussed reasons to return to MAU Has scheduled initial ob visit with [facility] next week Has scheduled appt with PCP tomorrow  Jorje Guild 11/06/2015, 10:18 PM

## 2015-11-06 NOTE — MAU Note (Signed)
Pt reports LMP 07/12, had a positive preg test at MD office and at home, states she started spotting yesterday and has had blood on the tissue when she wipes today.

## 2015-11-06 NOTE — Discharge Instructions (Signed)
Threatened Miscarriage A threatened miscarriage is when you have vaginal bleeding during your first 20 weeks of pregnancy but the pregnancy has not ended. Your doctor will do tests to make sure you are still pregnant. The cause of the bleeding may not be known. This condition does not mean your pregnancy will end. It does increase the risk of it ending (complete miscarriage). HOME CARE   Make sure you keep all your doctor visits for prenatal care.  Get plenty of rest.  Do not have sex or use tampons if you have vaginal bleeding.  Do not douche.  Do not smoke or use drugs.  Do not drink alcohol.  Avoid caffeine. GET HELP IF:  You have light bleeding from your vagina.  You have belly pain or cramping.  You have a fever. GET HELP RIGHT AWAY IF:   You have heavy bleeding from your vagina.  You have clots of blood coming from your vagina.  You have bad pain or cramps in your low back or belly.  You have fever, chills, and bad belly pain. MAKE SURE YOU:   Understand these instructions.  Will watch your condition.  Will get help right away if you are not doing well or get worse.   This information is not intended to replace advice given to you by your health care provider. Make sure you discuss any questions you have with your health care provider.   Document Released: 01/22/2008 Document Revised: 02/13/2013 Document Reviewed: 12/05/2012 Elsevier Interactive Patient Education 2016 Elsevier Inc.   Pelvic Rest Pelvic rest is sometimes recommended for women when:   The placenta is partially or completely covering the opening of the cervix (placenta previa).  There is bleeding between the uterine wall and the amniotic sac in the first trimester (subchorionic hemorrhage).  The cervix begins to open without labor starting (incompetent cervix, cervical insufficiency).  The labor is too early (preterm labor). HOME CARE INSTRUCTIONS  Do not have sexual intercourse,  stimulation, or an orgasm.  Do not use tampons, douche, or put anything in the vagina.  Do not lift anything over 10 pounds (4.5 kg).  Avoid strenuous activity or straining your pelvic muscles. SEEK MEDICAL CARE IF:  You have any vaginal bleeding during pregnancy. Treat this as a potential emergency.  You have cramping pain felt low in the stomach (stronger than menstrual cramps).  You notice vaginal discharge (watery, mucus, or bloody).  You have a low, dull backache.  There are regular contractions or uterine tightening. SEEK IMMEDIATE MEDICAL CARE IF: You have vaginal bleeding and have placenta previa.    This information is not intended to replace advice given to you by your health care provider. Make sure you discuss any questions you have with your health care provider.   Document Released: 06/05/2010 Document Revised: 05/03/2011 Document Reviewed: 08/12/2014 Elsevier Interactive Patient Education 2016 Elsevier Inc.     Uterine Fibroids Uterine fibroids are tissue masses (tumors). They are also called leiomyomas. They can develop inside of a woman's womb (uterus). They can grow very large. Fibroids are not cancerous (benign). Most fibroids do not require medical treatment. HOME CARE  Keep all follow-up visits as told by your doctor. This is important.  Take medicines only as told by your doctor.  If you were prescribed a hormone treatment, take the hormone medicines exactly as told.  Do not take aspirin. It can cause bleeding.  Ask your doctor about taking iron pills and increasing the amount of dark green, leafy  vegetables in your diet. These actions can help to boost your blood iron levels.  Pay close attention to your period. Tell your doctor about any changes, such as:  Increased blood flow. This may require you to use more pads or tampons than usual per month.  A change in the number of days that your period lasts per month.  A change in symptoms that  come with your period, such as back pain or cramping in your belly area (abdomen). GET HELP IF:  You have pain in your back or the area between your hip bones (pelvic area) that is not controlled by medicines.  You have pain in your abdomen that is not controlled with medicines.  You have an increase in bleeding between and during periods.  You soak tampons or pads in a half hour or less.  You feel lightheaded.  You feel extra tired.  You feel weak. GET HELP RIGHT AWAY IF:   You pass out (faint).  You have a sudden increase in pelvic pain.   This information is not intended to replace advice given to you by your health care provider. Make sure you discuss any questions you have with your health care provider.   Document Released: 03/13/2010 Document Revised: 03/01/2014 Document Reviewed: 08/07/2013 Elsevier Interactive Patient Education [employer].

## 2015-11-07 DIAGNOSIS — N939 Abnormal uterine and vaginal bleeding, unspecified: Secondary | ICD-10-CM | POA: Diagnosis not present

## 2015-11-07 DIAGNOSIS — E039 Hypothyroidism, unspecified: Secondary | ICD-10-CM | POA: Diagnosis not present

## 2015-11-07 DIAGNOSIS — Z23 Encounter for immunization: Secondary | ICD-10-CM | POA: Diagnosis not present

## 2015-11-07 DIAGNOSIS — M109 Gout, unspecified: Secondary | ICD-10-CM | POA: Diagnosis not present

## 2015-11-07 DIAGNOSIS — E1140 Type 2 diabetes mellitus with diabetic neuropathy, unspecified: Secondary | ICD-10-CM | POA: Diagnosis not present

## 2015-11-07 LAB — GC/CHLAMYDIA PROBE AMP (~~LOC~~) NOT AT ARMC
Chlamydia: NEGATIVE
Neisseria Gonorrhea: NEGATIVE

## 2015-11-08 LAB — HIV ANTIBODY (ROUTINE TESTING W REFLEX): HIV Screen 4th Generation wRfx: NONREACTIVE

## 2015-11-11 ENCOUNTER — Inpatient Hospital Stay (HOSPITAL_COMMUNITY): Payer: BLUE CROSS/BLUE SHIELD

## 2015-11-11 ENCOUNTER — Inpatient Hospital Stay (HOSPITAL_COMMUNITY)
Admission: AD | Admit: 2015-11-11 | Discharge: 2015-11-11 | Disposition: A | Discharge status: Home / Self Care | Payer: BLUE CROSS/BLUE SHIELD | Source: Ambulatory Visit | Attending: Obstetrics and Gynecology | Admitting: Obstetrics and Gynecology

## 2015-11-11 ENCOUNTER — Encounter (HOSPITAL_COMMUNITY): Payer: Self-pay

## 2015-11-11 DIAGNOSIS — Z3A09 9 weeks gestation of pregnancy: Secondary | ICD-10-CM | POA: Insufficient documentation

## 2015-11-11 DIAGNOSIS — Z3A00 Weeks of gestation of pregnancy not specified: Secondary | ICD-10-CM | POA: Diagnosis not present

## 2015-11-11 DIAGNOSIS — O200 Threatened abortion: Secondary | ICD-10-CM | POA: Diagnosis not present

## 2015-11-11 DIAGNOSIS — O209 Hemorrhage in early pregnancy, unspecified: Secondary | ICD-10-CM | POA: Insufficient documentation

## 2015-11-11 DIAGNOSIS — O24415 Gestational diabetes mellitus in pregnancy, controlled by oral hypoglycemic drugs: Secondary | ICD-10-CM | POA: Insufficient documentation

## 2015-11-11 LAB — CBC
HCT: 31.9 % — ABNORMAL LOW (ref 36.0–46.0)
Hemoglobin: 10.5 g/dL — ABNORMAL LOW (ref 12.0–15.0)
MCH: 26.4 pg (ref 26.0–34.0)
MCHC: 32.9 g/dL (ref 30.0–36.0)
MCV: 80.2 fL (ref 78.0–100.0)
Platelets: 317 10*3/uL (ref 150–400)
RBC: 3.98 MIL/uL (ref 3.87–5.11)
RDW: 14.1 % (ref 11.5–15.5)
WBC: 13.2 10*3/uL — ABNORMAL HIGH (ref 4.0–10.5)

## 2015-11-11 MED ORDER — ACETAMINOPHEN 325 MG PO TABS
650.0000 mg | ORAL_TABLET | Freq: Once | ORAL | Status: AC
  Administered 2015-11-11: 650 mg via ORAL
  Filled 2015-11-11: qty 2

## 2015-11-11 NOTE — MAU Provider Note (Signed)
History     CSN: [id number]  Arrival date and time: 11/11/15 1736   First Provider Initiated Contact with Patient 11/11/15 [place]       Chief Complaint  Patient presents with  . Vaginal Bleeding   Briana Fuentes is a 40 y.o. [phone] at [redacted]w[redacted]d who presents with vaginal bleeding. Pt was seen for same complaint in MAU 5 days ago. Had ultrasound that showed IUGS with yolk sac. States vaginal bleeding increased today. States she has used 6 pads today; no clots. Denies abdominal pain, n/v, or recent intercourse. Endorses diarrhea; states that is d/t her metformin. Has appt with [facility] tomorrow.   Vaginal Bleeding  The patient's primary symptoms include pelvic pain and vaginal bleeding. The patient's pertinent negatives include no genital itching, genital lesions or genital odor. This is a recurrent problem. The current episode started in the past 7 days. The problem occurs intermittently. The problem has been waxing and waning. The pain is mild. The problem affects both sides. She is pregnant. Associated symptoms include diarrhea. Pertinent negatives include no abdominal pain, constipation, dysuria, fever, nausea or vomiting. The vaginal discharge was bloody. The vaginal bleeding is typical of menses. She has been passing clots. She has not been passing tissue. Nothing aggravates the symptoms. She has tried nothing for the symptoms.   OB History    Gravida Para Term Preterm AB Living   4 3 1 2  0 3   SAB TAB Ectopic Multiple Live Births   0 0 0 0 3      Past Medical History:  Diagnosis Date  . Diabetes mellitus without complication (HCC)    Gestational diabetes  . Gout   . Hypothyroidism   . Infertility, female   . Neuropathy [facility])     Past Surgical History:  Procedure Laterality Date  . CESAREAN SECTION N/A 08/10/2012   Procedure: CESAREAN SECTION;  Surgeon: Mora Bellman, MD;  Location: [facility];  Service: Obstetrics;  Laterality: N/A;  Primary Cesarean Section Delivery Baby Girl @  2126, Apgars 9/9  . THYROIDECTOMY      Family History  Problem Relation Age of Onset  . Heart disease Father   . Stroke Father   . Diabetes Mellitus II Father   . Kidney failure Father     on dialysis  . Healthy Mother   . Healthy Brother   . Healthy Sister   . Healthy Daughter   . Healthy Son     Social History  Substance Use Topics  . Smoking status: Never Smoker  . Smokeless tobacco: Never Used  . Alcohol use No    Allergies: No Known Allergies  Prescriptions Prior to Admission  Medication Sig Dispense Refill Last Dose  . LEVEMIR FLEXTOUCH 100 UNIT/ML Pen   5   . levothyroxine (SYNTHROID, LEVOTHROID) 150 MCG tablet Take 150 mcg by mouth daily before breakfast.   11/05/2015 at Unknown time  . metFORMIN (GLUCOPHAGE-XR) 500 MG 24 hr tablet TAKE 1 TABLET BY MOUTH TWICE A DAY WITH MEALS 60 tablet 0 11/06/2015 at Unknown time    Review of Systems  Constitutional: Negative for fever.  Gastrointestinal: Positive for diarrhea. Negative for abdominal pain, constipation, nausea and vomiting.  Genitourinary: Positive for pelvic pain and vaginal bleeding. Negative for dysuria.       + vaginal bleeding   Physical Exam   Blood pressure (!) 120/52, pulse 86, temperature 98 F (36.7 C), temperature source Oral, resp. rate 18, last menstrual period 09/03/2015.  Physical Exam  Nursing note and vitals reviewed. Constitutional: She is oriented to person, place, and time. She appears well-developed and well-nourished. No distress.  HENT:  Head: Normocephalic and atraumatic.  Eyes: Conjunctivae are normal. Right eye exhibits no discharge. Left eye exhibits no discharge. No scleral icterus.  Neck: Normal range of motion.  Respiratory: Effort normal. No respiratory distress.  Neurological: She is alert and oriented to person, place, and time.  Skin: Skin is warm and dry. She is not diaphoretic.  Psychiatric: She has a normal mood and affect. Her behavior is normal. Judgment and thought  content normal.    MAU Course  Procedures  MDM Ultrasound report pending Care turned over to [place], NP 11/11/2015 8:04 PM   Assessment and Plan  I assumed care instead of Sherolyn Buba FNP. Reviewed ultrasound findings which indicate inevitable abortion.  Gestational sac is now elongeted  US Ob Comp Less 14 Wks  Result Date: 11/06/2015 CLINICAL DATA:  Pregnant patient in first-trimester pregnancy with vaginal bleeding. EXAM: OBSTETRIC <14 WK [place] AND TRANSVAGINAL OB US TECHNIQUE: Both transabdominal and transvaginal ultrasound examinations were performed for complete evaluation of the gestation as well as the maternal uterus, adnexal regions, and pelvic cul-de-sac. Transvaginal technique was performed to assess early pregnancy. COMPARISON:  None. FINDINGS: Intrauterine gestational sac: Single Yolk sac:  Present. Embryo:  Not visualized. Cardiac Activity: Not visualized. MSD: 12.3  mm   6 w   0  d Subchorionic hemorrhage:  None visualized. Maternal uterus/adnexae: Uterine fibroid in the right mid uterus measures 4.1 x 3.5 x 3.6 cm. The right ovary is normal. The left ovary is normal and contains a corpus luteal cyst. There is no pelvic free fluid. IMPRESSION: Intrauterine gestational sac containing a yolk sac but no fetal pole or cardiac activity yet demonstrated. Recommend follow-up quantitative B-HCG levels and follow-up US in 10 days to confirm and assess viability. Right uterine fibroid. Electronically Signed   By: Jeb Levering M.D.   On: 11/06/2015 23:49   US Ob Transvaginal  Result Date: 11/11/2015 CLINICAL DATA:  First trimester vaginal bleeding. EXAM: TRANSVAGINAL OB ULTRASOUND TECHNIQUE: Transvaginal ultrasound was performed for complete evaluation of the gestation as well as the maternal uterus, adnexal regions, and pelvic cul-de-sac. COMPARISON:  11/06/2015 FINDINGS: Intrauterine gestational sac: Not visualized. Yolk sac:  Not visualized Embryo:  Not  visualized Cardiac Activity: Not detect Heart Rate: Not detected bpm Subchorionic hemorrhage:  None visualized. Maternal uterus/adnexae: Small cystic focus in the cervix region evident. Previous intrauterine gestational sac no longer visualized. This is concerning for ongoing abortion. Cervical cystic focus roughly measures 9 mm, correlates with a 5 week 5 day gestational age. Uterine fibroids noted some of which have calcification from degeneration. This distorts the uterine contour. Largest fibroid measures 3.7 x 2.9 x 2.9 cm. Left ovary contains a corpus luteum cyst measuring 1.7 x 1.6 x 1.4 cm. Right ovary was not visualized on today's exam but previously demonstrated on 11/06/2015. No free fluid. IMPRESSION: Previous intrauterine gestational sac is no longer visualized. Elongated abnormal cystic focus in the cervix now present concerning for ongoing abortion. Recommend correlation with serial beta HCG and follow-up ultrasound if clinically indicated. Electronically Signed   By: Jerilynn Mages.  Shick M.D.   On: 11/11/2015 20:08   US Ob Transvaginal  Result Date: 11/06/2015 CLINICAL DATA:  Pregnant patient in first-trimester pregnancy with vaginal bleeding. EXAM: OBSTETRIC <14 WK [place] AND TRANSVAGINAL OB US TECHNIQUE: Both transabdominal and transvaginal ultrasound examinations were performed for complete  evaluation of the gestation as well as the maternal uterus, adnexal regions, and pelvic cul-de-sac. Transvaginal technique was performed to assess early pregnancy. COMPARISON:  None. FINDINGS: Intrauterine gestational sac: Single Yolk sac:  Present. Embryo:  Not visualized. Cardiac Activity: Not visualized. MSD: 12.3  mm   6 w   0  d Subchorionic hemorrhage:  None visualized. Maternal uterus/adnexae: Uterine fibroid in the right mid uterus measures 4.1 x 3.5 x 3.6 cm. The right ovary is normal. The left ovary is normal and contains a corpus luteal cyst. There is no pelvic free fluid. IMPRESSION: Intrauterine gestational  sac containing a yolk sac but no fetal pole or cardiac activity yet demonstrated. Recommend follow-up quantitative B-HCG levels and follow-up US in 10 days to confirm and assess viability. Right uterine fibroid. Electronically Signed   By: Jeb Levering M.D.   On: 11/06/2015 23:49    Explained US findings to patient and husband It looks like an inevitable abortion, but radiologist recommends following HCG and [place] Has appt tomorrow at [facility] Patient concerned about heavy bleeding So I will check a CBC before she leaves Also wants a Tylenol for headache Results for orders placed or performed during the hospital encounter of 11/11/15 (from the past 72 hour(s))  CBC     Status: Abnormal   Collection Time: 11/11/15  9:20 PM  Result Value Ref Range   WBC 13.2 (H) 4.0 - 10.5 K/uL   RBC 3.98 3.87 - 5.11 MIL/uL   Hemoglobin 10.5 (L) 12.0 - 15.0 g/dL   HCT 31.9 (L) 36.0 - 46.0 %   MCV 80.2 78.0 - 100.0 fL   MCH 26.4 26.0 - 34.0 pg   MCHC 32.9 30.0 - 36.0 g/dL   RDW 14.1 11.5 - 15.5 %   Platelets 317 150 - 400 K/uL   Last HGB on 11/06/15 was   Ref. Range 11/06/2015 22:24  Hemoglobin Latest Ref Range: 12.0 - 15.0 g/dL 10.9 (L)   Therefore, Hgb is stable. Advised to keep appointment tomorrow.  Seabron Spates, CNM

## 2015-11-11 NOTE — Discharge Instructions (Signed)

## 2015-11-11 NOTE — MAU Note (Addendum)
Pt C/O heavy bleeding since this morning, has changed 6 pads today.  Denies pain.  Pt has been having diarrhea but attributes this to her metformin.

## 2015-12-02 DIAGNOSIS — E790 Hyperuricemia without signs of inflammatory arthritis and tophaceous disease: Secondary | ICD-10-CM | POA: Diagnosis not present

## 2015-12-02 DIAGNOSIS — E0590 Thyrotoxicosis, unspecified without thyrotoxic crisis or storm: Secondary | ICD-10-CM | POA: Diagnosis not present

## 2015-12-02 DIAGNOSIS — R7309 Other abnormal glucose: Secondary | ICD-10-CM | POA: Diagnosis not present

## 2015-12-02 DIAGNOSIS — Z23 Encounter for immunization: Secondary | ICD-10-CM | POA: Diagnosis not present

## 2015-12-22 DIAGNOSIS — Z6826 Body mass index (BMI) 26.0-26.9, adult: Secondary | ICD-10-CM | POA: Diagnosis not present

## 2015-12-22 DIAGNOSIS — Z0001 Encounter for general adult medical examination with abnormal findings: Secondary | ICD-10-CM | POA: Diagnosis not present

## 2015-12-22 DIAGNOSIS — E0590 Thyrotoxicosis, unspecified without thyrotoxic crisis or storm: Secondary | ICD-10-CM | POA: Insufficient documentation

## 2015-12-22 DIAGNOSIS — N939 Abnormal uterine and vaginal bleeding, unspecified: Secondary | ICD-10-CM | POA: Diagnosis not present

## 2015-12-22 DIAGNOSIS — Z3202 Encounter for pregnancy test, result negative: Secondary | ICD-10-CM | POA: Diagnosis not present

## 2015-12-22 DIAGNOSIS — O034 Incomplete spontaneous abortion without complication: Secondary | ICD-10-CM | POA: Diagnosis not present

## 2016-01-20 ENCOUNTER — Other Ambulatory Visit: Payer: Self-pay | Admitting: Neurology

## 2016-01-28 DIAGNOSIS — E1142 Type 2 diabetes mellitus with diabetic polyneuropathy: Secondary | ICD-10-CM | POA: Diagnosis not present

## 2016-01-28 DIAGNOSIS — G629 Polyneuropathy, unspecified: Secondary | ICD-10-CM | POA: Diagnosis not present

## 2016-01-28 DIAGNOSIS — E890 Postprocedural hypothyroidism: Secondary | ICD-10-CM | POA: Diagnosis not present

## 2016-01-28 DIAGNOSIS — E0500 Thyrotoxicosis with diffuse goiter without thyrotoxic crisis or storm: Secondary | ICD-10-CM | POA: Diagnosis not present

## 2016-05-06 DIAGNOSIS — G609 Hereditary and idiopathic neuropathy, unspecified: Secondary | ICD-10-CM | POA: Diagnosis not present

## 2016-05-06 DIAGNOSIS — E559 Vitamin D deficiency, unspecified: Secondary | ICD-10-CM | POA: Diagnosis not present

## 2016-05-06 DIAGNOSIS — E1165 Type 2 diabetes mellitus with hyperglycemia: Secondary | ICD-10-CM | POA: Diagnosis not present

## 2016-05-06 DIAGNOSIS — E039 Hypothyroidism, unspecified: Secondary | ICD-10-CM | POA: Diagnosis not present

## 2016-05-11 DIAGNOSIS — H26493 Other secondary cataract, bilateral: Secondary | ICD-10-CM | POA: Diagnosis not present

## 2016-06-10 DIAGNOSIS — G629 Polyneuropathy, unspecified: Secondary | ICD-10-CM | POA: Diagnosis not present

## 2016-06-10 DIAGNOSIS — E039 Hypothyroidism, unspecified: Secondary | ICD-10-CM | POA: Diagnosis not present

## 2016-06-10 DIAGNOSIS — M1A9XX0 Chronic gout, unspecified, without tophus (tophi): Secondary | ICD-10-CM | POA: Diagnosis not present

## 2016-06-21 DIAGNOSIS — H15001 Unspecified scleritis, right eye: Secondary | ICD-10-CM | POA: Diagnosis not present

## 2016-06-28 DIAGNOSIS — H0520 Unspecified exophthalmos: Secondary | ICD-10-CM | POA: Diagnosis not present

## 2016-06-28 DIAGNOSIS — E0500 Thyrotoxicosis with diffuse goiter without thyrotoxic crisis or storm: Secondary | ICD-10-CM | POA: Diagnosis not present

## 2016-06-30 DIAGNOSIS — E0500 Thyrotoxicosis with diffuse goiter without thyrotoxic crisis or storm: Secondary | ICD-10-CM | POA: Diagnosis not present

## 2016-07-01 ENCOUNTER — Other Ambulatory Visit (HOSPITAL_COMMUNITY): Payer: Self-pay | Admitting: Oculoplastics Ophthalmology

## 2016-07-01 DIAGNOSIS — E0500 Thyrotoxicosis with diffuse goiter without thyrotoxic crisis or storm: Secondary | ICD-10-CM

## 2016-07-07 ENCOUNTER — Ambulatory Visit (HOSPITAL_COMMUNITY)
Admission: RE | Admit: 2016-07-07 | Discharge: 2016-07-07 | Disposition: A | Discharge status: Home / Self Care | Payer: BLUE CROSS/BLUE SHIELD | Source: Ambulatory Visit | Attending: Oculoplastics Ophthalmology | Admitting: Oculoplastics Ophthalmology

## 2016-07-07 DIAGNOSIS — R34 Anuria and oliguria: Secondary | ICD-10-CM | POA: Diagnosis not present

## 2016-07-07 DIAGNOSIS — H0520 Unspecified exophthalmos: Secondary | ICD-10-CM | POA: Insufficient documentation

## 2016-07-07 DIAGNOSIS — E0500 Thyrotoxicosis with diffuse goiter without thyrotoxic crisis or storm: Secondary | ICD-10-CM

## 2016-07-20 DIAGNOSIS — E0500 Thyrotoxicosis with diffuse goiter without thyrotoxic crisis or storm: Secondary | ICD-10-CM | POA: Diagnosis not present

## 2016-07-21 DIAGNOSIS — E039 Hypothyroidism, unspecified: Secondary | ICD-10-CM | POA: Diagnosis not present

## 2016-07-21 DIAGNOSIS — G609 Hereditary and idiopathic neuropathy, unspecified: Secondary | ICD-10-CM | POA: Diagnosis not present

## 2016-07-21 DIAGNOSIS — E559 Vitamin D deficiency, unspecified: Secondary | ICD-10-CM | POA: Diagnosis not present

## 2016-07-21 DIAGNOSIS — E1165 Type 2 diabetes mellitus with hyperglycemia: Secondary | ICD-10-CM | POA: Diagnosis not present

## 2016-07-27 DIAGNOSIS — M79673 Pain in unspecified foot: Secondary | ICD-10-CM | POA: Diagnosis not present

## 2016-07-27 DIAGNOSIS — E039 Hypothyroidism, unspecified: Secondary | ICD-10-CM | POA: Diagnosis not present

## 2016-07-27 DIAGNOSIS — M109 Gout, unspecified: Secondary | ICD-10-CM | POA: Diagnosis not present

## 2016-07-27 DIAGNOSIS — E1140 Type 2 diabetes mellitus with diabetic neuropathy, unspecified: Secondary | ICD-10-CM | POA: Diagnosis not present

## 2016-08-02 DIAGNOSIS — M109 Gout, unspecified: Secondary | ICD-10-CM | POA: Diagnosis not present

## 2016-08-02 DIAGNOSIS — D649 Anemia, unspecified: Secondary | ICD-10-CM | POA: Diagnosis not present

## 2016-08-02 DIAGNOSIS — E781 Pure hyperglyceridemia: Secondary | ICD-10-CM | POA: Diagnosis not present

## 2016-08-02 DIAGNOSIS — Z0000 Encounter for general adult medical examination without abnormal findings: Secondary | ICD-10-CM | POA: Diagnosis not present

## 2016-08-02 DIAGNOSIS — E1140 Type 2 diabetes mellitus with diabetic neuropathy, unspecified: Secondary | ICD-10-CM | POA: Diagnosis not present

## 2016-08-12 DIAGNOSIS — M1990 Unspecified osteoarthritis, unspecified site: Secondary | ICD-10-CM | POA: Diagnosis not present

## 2016-08-12 DIAGNOSIS — M79672 Pain in left foot: Secondary | ICD-10-CM | POA: Diagnosis not present

## 2016-08-12 DIAGNOSIS — M79671 Pain in right foot: Secondary | ICD-10-CM | POA: Diagnosis not present

## 2016-09-06 DIAGNOSIS — M79671 Pain in right foot: Secondary | ICD-10-CM | POA: Diagnosis not present

## 2016-09-06 DIAGNOSIS — M1990 Unspecified osteoarthritis, unspecified site: Secondary | ICD-10-CM | POA: Diagnosis not present

## 2016-09-10 ENCOUNTER — Encounter (HOSPITAL_COMMUNITY): Payer: Self-pay

## 2016-10-07 DIAGNOSIS — G609 Hereditary and idiopathic neuropathy, unspecified: Secondary | ICD-10-CM | POA: Diagnosis not present

## 2016-10-07 DIAGNOSIS — M62838 Other muscle spasm: Secondary | ICD-10-CM | POA: Diagnosis not present

## 2016-10-08 DIAGNOSIS — M722 Plantar fascial fibromatosis: Secondary | ICD-10-CM | POA: Diagnosis not present

## 2016-10-11 DIAGNOSIS — M4126 Other idiopathic scoliosis, lumbar region: Secondary | ICD-10-CM | POA: Diagnosis not present

## 2016-10-11 DIAGNOSIS — M9903 Segmental and somatic dysfunction of lumbar region: Secondary | ICD-10-CM | POA: Diagnosis not present

## 2016-10-11 DIAGNOSIS — M9905 Segmental and somatic dysfunction of pelvic region: Secondary | ICD-10-CM | POA: Diagnosis not present

## 2016-10-11 DIAGNOSIS — Q72812 Congenital shortening of left lower limb: Secondary | ICD-10-CM | POA: Diagnosis not present

## 2016-10-19 ENCOUNTER — Ambulatory Visit: Payer: BLUE CROSS/BLUE SHIELD | Admitting: Sports Medicine

## 2016-10-28 DIAGNOSIS — E1140 Type 2 diabetes mellitus with diabetic neuropathy, unspecified: Secondary | ICD-10-CM | POA: Diagnosis not present

## 2016-10-28 DIAGNOSIS — E039 Hypothyroidism, unspecified: Secondary | ICD-10-CM | POA: Diagnosis not present

## 2016-11-01 ENCOUNTER — Ambulatory Visit (INDEPENDENT_AMBULATORY_CARE_PROVIDER_SITE_OTHER): Payer: BLUE CROSS/BLUE SHIELD | Admitting: Sports Medicine

## 2016-11-01 VITALS — BP 110/80 | Ht 65.0 in | Wt 154.0 lb

## 2016-11-01 DIAGNOSIS — M7742 Metatarsalgia, left foot: Secondary | ICD-10-CM | POA: Diagnosis not present

## 2016-11-01 DIAGNOSIS — M7741 Metatarsalgia, right foot: Secondary | ICD-10-CM

## 2016-11-01 DIAGNOSIS — Q667 Congenital pes cavus, unspecified foot: Secondary | ICD-10-CM

## 2016-11-01 NOTE — Progress Notes (Signed)
   Subjective:    Patient ID: Briana Fuentes, female    DOB: 01/25/1976, 41 y.o.   MRN: [id number]  41yo F who presents with cc "bilateral foot pain".  Pain is located at her forefoot, plantar aspect and has been painful for the past 3 years. She rates her pain 9/10 in nature and is non radiating. She has had an extensive work up in the past including: negative foot xrays bilaterally, several (4-5) steroid injections both in the 1st MTP and general forefoot without relief, custom orthodics, and a change in her daily walking shoe without significant improvement in symptoms. Sx exacerbated by prolonged walking or standing as well as prolonged sitting. States she has associated stiffness in the morning and has to "prepare to get up out of bed". She has also tried a Medrol dosepak without relief in pain. Other sx: no weakness, + neuropathy in both feet. She is a known Type II diabetic w/ peripheral neuropathy on Lyrica 150mg  daily.      Review of Systems  Constitutional: Negative for fever.  Musculoskeletal: Positive for arthralgias (+ metatarsal head pain b/l) and gait problem. Negative for joint swelling.       + stiffness, + tingling  Skin: Negative for color change, rash and wound.       Objective:   Physical Exam  Constitutional: She appears well-developed and well-nourished.  Musculoskeletal: She exhibits tenderness and deformity. She exhibits no edema.  Significant pes cavus feet upon inspection bilaterally. Tenderness to palpation focally over metatarsal heads 1-4 bilaterally, equal tenderness. No pain in between 2nd-3rd metatarsals. No pain at base of 5th metatarsal. No pain upon palpation of the calcaneus or plantar fascia. No gastrocnemius tightness. No restriction in ROM in ankle dorsiflexion/plantar flexion. Able to flex and extend toes however discomfort illicted with toe off phase of gait. EHL +5/5 b/l and FHL +5/5 b/l. Sensation intact to light touch L4-S1. DP pulse +2/4 b/l. PT pulse  +2/4 bilaterally. No pain on ankle testing. Normal anterior drawer. Negative inversion/eversion testing.  Neurological: She exhibits normal muscle tone.  Skin: Skin is warm. No rash noted.  Nursing note and vitals reviewed.         Assessment & Plan:  B/L Forefoot pain B/L Pes cavus feet  Reviewed prior foot xrays which did not demonstrate any significant abnormalities. At this point, the patient does not have a more specific diagnosis based upon past workup. She has not received any relief from steroid injections and has failed conservative management with rest, ice, and NSAIDS. I am recommending further work up with a R foot MRI for further diagnosis. She may continue taking Lyrica for her neuropathic pain. I will see her back in 1-2 days after the MRI to discuss the results. She is to continue to wear shoes with adequate arch support and bring in her daily walking shoes into her next office visit for further evaluation. Patient is agreeable to this plan.

## 2016-11-01 NOTE — Patient Instructions (Addendum)
It was great to see you today for your office visit. I will order an MRI of your right foot to help further diagnose why you are having so much discomfort. I will see you 1-2 days after the MRI to discuss the results and further treatment for this. I would avoid any further steroid injections until we have a more concise diagnosis. Foot Pain Many things can cause foot pain. Some common causes are:  An injury.  A sprain.  Arthritis.  Blisters.  Bunions.  Follow these instructions at home: Pay attention to any changes in your symptoms. Take these actions to help with your discomfort:  If directed, put ice on the affected area: ? Put ice in a plastic bag. ? Place a towel between your skin and the bag. ? Leave the ice on for 15-20 minutes, 3?4 times a day for 2 days.  Take over-the-counter and prescription medicines only as told by your health care provider.  Wear comfortable, supportive shoes that fit you well. Do not wear high heels.  Do not stand or walk for long periods of time.  Do not lift a lot of weight. This can put added pressure on your feet.  Do stretches to relieve foot pain and stiffness as told by your health care provider.  Rub your foot gently.  Keep your feet clean and dry.  Contact a health care provider if:  Your pain does not get better after a few days of self-care.  Your pain gets worse.  You cannot stand on your foot. Get help right away if:  Your foot is numb or tingling.  Your foot or toes are swollen.  Your foot or toes turn white or blue.  You have warmth and redness along your foot. This information is not intended to replace advice given to you by your health care provider. Make sure you discuss any questions you have with your health care provider. Document Released: 03/07/2015 Document Revised: 07/17/2015 Document Reviewed: 03/06/2014 Elsevier Interactive Patient Education  [employer].

## 2016-11-12 ENCOUNTER — Ambulatory Visit
Admission: RE | Admit: 2016-11-12 | Discharge: 2016-11-12 | Disposition: A | Discharge status: Home / Self Care | Payer: BLUE CROSS/BLUE SHIELD | Source: Ambulatory Visit | Attending: Sports Medicine | Admitting: Sports Medicine

## 2016-11-12 DIAGNOSIS — M7741 Metatarsalgia, right foot: Secondary | ICD-10-CM

## 2016-11-12 DIAGNOSIS — M25471 Effusion, right ankle: Secondary | ICD-10-CM | POA: Diagnosis not present

## 2016-11-12 DIAGNOSIS — Q667 Congenital pes cavus, unspecified foot: Secondary | ICD-10-CM

## 2016-11-12 DIAGNOSIS — M7742 Metatarsalgia, left foot: Principal | ICD-10-CM

## 2016-11-22 DIAGNOSIS — G609 Hereditary and idiopathic neuropathy, unspecified: Secondary | ICD-10-CM | POA: Diagnosis not present

## 2016-11-22 DIAGNOSIS — E559 Vitamin D deficiency, unspecified: Secondary | ICD-10-CM | POA: Diagnosis not present

## 2016-11-22 DIAGNOSIS — E039 Hypothyroidism, unspecified: Secondary | ICD-10-CM | POA: Diagnosis not present

## 2016-11-22 DIAGNOSIS — E1165 Type 2 diabetes mellitus with hyperglycemia: Secondary | ICD-10-CM | POA: Diagnosis not present

## 2016-11-29 ENCOUNTER — Ambulatory Visit (INDEPENDENT_AMBULATORY_CARE_PROVIDER_SITE_OTHER): Payer: BLUE CROSS/BLUE SHIELD | Admitting: Sports Medicine

## 2016-11-29 VITALS — BP 102/78 | Ht 65.0 in | Wt 153.0 lb

## 2016-11-29 DIAGNOSIS — M7742 Metatarsalgia, left foot: Secondary | ICD-10-CM | POA: Diagnosis not present

## 2016-11-29 DIAGNOSIS — M7741 Metatarsalgia, right foot: Secondary | ICD-10-CM | POA: Diagnosis not present

## 2016-11-29 NOTE — Progress Notes (Signed)
  Patient comes in today to discuss MRI findings of her right foot. She has evidence of intermetatarsal bursitis at the first, second, and third web spaces. This correlates with her pain. She has similar symptoms in her left foot as well. Physical exam was not repeated. We simply talked about her diagnosis. 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 I would like to try a green sports insole with a scaphoid pad and a metatarsal pad and she will return to the office in 4 weeks for reevaluation. We will consider custom orthotics at that time. I've recommended that she not have any more cortisone injections for this problem and I've reassured her that this is rarely treated surgically. She understands.  Total time spent with the patient was 15 minutes with greater than 50% of the time spent in face-to-face consultation discussing her MRI findings and treatment.

## 2016-12-31 DIAGNOSIS — E1140 Type 2 diabetes mellitus with diabetic neuropathy, unspecified: Secondary | ICD-10-CM | POA: Diagnosis not present

## 2016-12-31 DIAGNOSIS — E781 Pure hyperglyceridemia: Secondary | ICD-10-CM | POA: Diagnosis not present

## 2017-01-03 DIAGNOSIS — Z124 Encounter for screening for malignant neoplasm of cervix: Secondary | ICD-10-CM | POA: Diagnosis not present

## 2017-01-03 DIAGNOSIS — Z01419 Encounter for gynecological examination (general) (routine) without abnormal findings: Secondary | ICD-10-CM | POA: Diagnosis not present

## 2017-01-03 DIAGNOSIS — Z6825 Body mass index (BMI) 25.0-25.9, adult: Secondary | ICD-10-CM | POA: Diagnosis not present

## 2017-02-21 DIAGNOSIS — M47816 Spondylosis without myelopathy or radiculopathy, lumbar region: Secondary | ICD-10-CM | POA: Diagnosis not present

## 2017-02-21 DIAGNOSIS — M955 Acquired deformity of pelvis: Secondary | ICD-10-CM | POA: Diagnosis not present

## 2017-03-05 ENCOUNTER — Encounter (HOSPITAL_COMMUNITY): Payer: Self-pay | Admitting: *Deleted

## 2017-03-05 ENCOUNTER — Ambulatory Visit (HOSPITAL_COMMUNITY)
Admission: EM | Admit: 2017-03-05 | Discharge: 2017-03-05 | Disposition: A | Discharge status: Home / Self Care | Payer: BLUE CROSS/BLUE SHIELD | Attending: Family Medicine | Admitting: Family Medicine

## 2017-03-05 ENCOUNTER — Other Ambulatory Visit: Payer: Self-pay

## 2017-03-05 DIAGNOSIS — M7741 Metatarsalgia, right foot: Secondary | ICD-10-CM | POA: Diagnosis not present

## 2017-03-05 DIAGNOSIS — M7742 Metatarsalgia, left foot: Secondary | ICD-10-CM

## 2017-03-05 MED ORDER — TRAMADOL HCL 50 MG PO TABS: 50.0000 mg | ORAL_TABLET | Freq: Four times a day (QID) | ORAL | 0 refills | Status: DC | PRN

## 2017-03-05 MED ORDER — PREDNISONE 20 MG PO TABS: 20.0000 mg | ORAL_TABLET | Freq: Every day | ORAL | 0 refills | Status: DC

## 2017-03-05 NOTE — ED Triage Notes (Signed)
C/O bilat foot pain/burning x several months; pain now worsening and constant x 3 wks.  Pt is diabetic.  Denies any wounds.

## 2017-03-05 NOTE — ED Provider Notes (Signed)
[place]   [id number] 03/05/17 Arrival Time: 1410   SUBJECTIVE:  Briana Fuentes is a 42 y.o. female who presents to the urgent care with complaint of bilat foot pain/burning x several months; pain now worsening and constant x 3 wks.  Pt is diabetic.  Denies any wounds.   Patient has tried orthotics, reflexologist, antiinflammatories, Lyrica with no help.  Pain is on metatarsal heads  MRI showed "bursitis"   Past Medical History:  Diagnosis Date  . Diabetes mellitus without complication (HCC)    Gestational diabetes  . Gout   . Hypothyroidism   . Infertility, female   . Neuropathy    Family History  Problem Relation Age of Onset  . Heart disease Father   . Stroke Father   . Diabetes Mellitus II Father   . Kidney failure Father        on dialysis  . Healthy Mother   . Healthy Brother   . Healthy Sister   . Healthy Daughter   . Healthy Son    Social History   Socioeconomic History  . Marital status: Married    Spouse name: Not on file  . Number of children: Not on file  . Years of education: Not on file  . Highest education level: Not on file  Social Needs  . Financial resource strain: Not on file  . Food insecurity - worry: Not on file  . Food insecurity - inability: Not on file  . Transportation needs - medical: Not on file  . Transportation needs - non-medical: Not on file  Occupational History  . Not on file  Tobacco Use  . Smoking status: Never Smoker  . Smokeless tobacco: Never Used  Substance and Sexual Activity  . Alcohol use: No  . Drug use: No  . Sexual activity: Not on file  Other Topics Concern  . Not on file  Social History Narrative   She lives with husband, three children, and mother.     She was previously working at [employer]).   Currently is a Materials engineer.     Current Meds  Medication Sig  . insulin aspart (NOVOLOG) 100 UNIT/ML injection Inject 10 Units into the skin daily.  [name] levothyroxine  (SYNTHROID, LEVOTHROID) 150 MCG tablet Take 150 mcg by mouth daily before breakfast.  . metFORMIN (GLUCOPHAGE-XR) 500 MG 24 hr tablet TAKE 1 TABLET BY MOUTH TWICE A DAY WITH MEALS  . pregabalin (LYRICA) 100 MG capsule Take 100 mg by mouth 3 (three) times daily.   No Known Allergies    ROS: As per HPI, remainder of ROS negative.   OBJECTIVE:   Vitals:   03/05/17 1458  BP: 110/76  Pulse: 90  Resp: 16  Temp: 97.7 F (36.5 C)  TempSrc: Oral  SpO2: 97%     General appearance: alert; no distress Eyes: PERRL; EOMI; conjunctiva normal HENT: normocephalic; atraumatic; TMs normal, canal normal, external ears normal without trauma; nasal mucosa normal; oral mucosa normal Neck: supple Lungs: clear to auscultation bilaterally Heart: regular rate and rhythm Abdomen: soft, non-tender; bowel sounds normal; no masses or organomegaly; no guarding or rebound tenderness Back: no CVA tenderness Extremities: no cyanosis or edema; symmetrical with no gross deformities Skin: warm and dry Neurologic: normal gait; grossly normal Psychological: alert and cooperative; normal mood and affect  COMPARISON:  Radiographs 08/12/2016  FINDINGS: The bony structures are unremarkable. No stress fracture. The joint spaces are maintained. No erosive findings. There are small metatarsal phalangeal  joint effusions. There is also intermetatarsal bursitis at the first, second and third web spaces. No obvious webspace mass or nodule to suggest a Morton's neuroma.  The foot musculature appears normal. No muscle lesion or myositis. No subcutaneous lesions. The major tendons and ligaments appear intact.  IMPRESSION: 1. No acute or significant bony findings. No stress fracture or avascular necrosis. 2. Intermetatarsal bursitis. 3. Small metatarsal phalangeal joint effusions but no erosive changes or inflammatory changes.   Electronically Signed   By: Marijo Sanes M.D.   On: 11/12/2016  17:06    Labs:  Results for orders placed or performed during the hospital encounter of 11/11/15  CBC  Result Value Ref Range   WBC 13.2 (H) 4.0 - 10.5 K/uL   RBC 3.98 3.87 - 5.11 MIL/uL   Hemoglobin 10.5 (L) 12.0 - 15.0 g/dL   HCT 31.9 (L) 36.0 - 46.0 %   MCV 80.2 78.0 - 100.0 fL   MCH 26.4 26.0 - 34.0 pg   MCHC 32.9 30.0 - 36.0 g/dL   RDW 14.1 11.5 - 15.5 %   Platelets 317 150 - 400 K/uL    Labs Reviewed - No data to display  No results found.     ASSESSMENT & PLAN:  1. Metatarsalgia of both feet     Meds ordered this encounter  Medications  . predniSONE (DELTASONE) 20 MG tablet    Sig: Take 1 tablet (20 mg total) by mouth daily with breakfast. Two daily with food    Dispense:  7 tablet    Refill:  0  . traMADol (ULTRAM) 50 MG tablet    Sig: Take 1 tablet (50 mg total) by mouth every 6 (six) hours as needed.    Dispense:  15 tablet    Refill:  0    Reviewed expectations re: course of current medical issues. Questions answered. Outlined signs and symptoms indicating need for more acute intervention. Patient verbalized understanding. After Visit Summary given.     Robyn Haber, MD 03/05/17 1529

## 2017-03-30 DIAGNOSIS — E039 Hypothyroidism, unspecified: Secondary | ICD-10-CM | POA: Diagnosis not present

## 2017-03-30 DIAGNOSIS — E1165 Type 2 diabetes mellitus with hyperglycemia: Secondary | ICD-10-CM | POA: Diagnosis not present

## 2017-03-30 DIAGNOSIS — G609 Hereditary and idiopathic neuropathy, unspecified: Secondary | ICD-10-CM | POA: Diagnosis not present

## 2017-03-30 DIAGNOSIS — E559 Vitamin D deficiency, unspecified: Secondary | ICD-10-CM | POA: Diagnosis not present

## 2017-04-05 DIAGNOSIS — H15101 Unspecified episcleritis, right eye: Secondary | ICD-10-CM | POA: Diagnosis not present

## 2017-04-25 DIAGNOSIS — M7742 Metatarsalgia, left foot: Secondary | ICD-10-CM | POA: Diagnosis not present

## 2017-04-25 DIAGNOSIS — M79605 Pain in left leg: Secondary | ICD-10-CM | POA: Diagnosis not present

## 2017-04-25 DIAGNOSIS — M79672 Pain in left foot: Secondary | ICD-10-CM | POA: Diagnosis not present

## 2017-04-25 DIAGNOSIS — M79675 Pain in left toe(s): Secondary | ICD-10-CM

## 2017-04-25 DIAGNOSIS — G8929 Other chronic pain: Secondary | ICD-10-CM | POA: Insufficient documentation

## 2017-05-02 DIAGNOSIS — M79672 Pain in left foot: Secondary | ICD-10-CM | POA: Diagnosis not present

## 2017-05-13 DIAGNOSIS — E1165 Type 2 diabetes mellitus with hyperglycemia: Secondary | ICD-10-CM | POA: Diagnosis not present

## 2017-05-13 DIAGNOSIS — G609 Hereditary and idiopathic neuropathy, unspecified: Secondary | ICD-10-CM | POA: Diagnosis not present

## 2017-05-13 DIAGNOSIS — E559 Vitamin D deficiency, unspecified: Secondary | ICD-10-CM | POA: Diagnosis not present

## 2017-05-13 DIAGNOSIS — E039 Hypothyroidism, unspecified: Secondary | ICD-10-CM | POA: Diagnosis not present

## 2017-08-16 DIAGNOSIS — E1165 Type 2 diabetes mellitus with hyperglycemia: Secondary | ICD-10-CM | POA: Diagnosis not present

## 2017-08-16 DIAGNOSIS — E039 Hypothyroidism, unspecified: Secondary | ICD-10-CM | POA: Diagnosis not present

## 2017-08-16 DIAGNOSIS — E559 Vitamin D deficiency, unspecified: Secondary | ICD-10-CM | POA: Diagnosis not present

## 2017-08-16 DIAGNOSIS — G609 Hereditary and idiopathic neuropathy, unspecified: Secondary | ICD-10-CM | POA: Diagnosis not present

## 2017-08-22 DIAGNOSIS — R300 Dysuria: Secondary | ICD-10-CM | POA: Diagnosis not present

## 2017-09-05 DIAGNOSIS — Z6825 Body mass index (BMI) 25.0-25.9, adult: Secondary | ICD-10-CM | POA: Diagnosis not present

## 2017-09-05 DIAGNOSIS — R300 Dysuria: Secondary | ICD-10-CM | POA: Diagnosis not present

## 2017-09-12 DIAGNOSIS — N644 Mastodynia: Secondary | ICD-10-CM | POA: Diagnosis not present

## 2017-09-12 DIAGNOSIS — Z6826 Body mass index (BMI) 26.0-26.9, adult: Secondary | ICD-10-CM | POA: Diagnosis not present

## 2017-09-12 DIAGNOSIS — N760 Acute vaginitis: Secondary | ICD-10-CM | POA: Diagnosis not present

## 2017-10-10 DIAGNOSIS — E039 Hypothyroidism, unspecified: Secondary | ICD-10-CM | POA: Diagnosis not present

## 2017-10-10 DIAGNOSIS — E1140 Type 2 diabetes mellitus with diabetic neuropathy, unspecified: Secondary | ICD-10-CM | POA: Diagnosis not present

## 2017-10-10 DIAGNOSIS — E781 Pure hyperglyceridemia: Secondary | ICD-10-CM | POA: Diagnosis not present

## 2017-10-10 DIAGNOSIS — M109 Gout, unspecified: Secondary | ICD-10-CM | POA: Diagnosis not present

## 2017-11-10 IMAGING — MR MR FOOT*R* W/O CM
6 of 7 series · 32 of 40 positions shown · non-contrast
Comparison: Radiographs 08/12/2016

CLINICAL DATA: Chronic foot pain

EXAM:
MRI OF THE RIGHT FOREFOOT WITHOUT CONTRAST
TECHNIQUE: Multiplanar, multisequence MR imaging of the right foot was
performed. No intravenous contrast was administered.

[Series 6: T1 · coronal · right · 3.0mm · 0.27mm/px · 7 of 49 slices shown (1 of 2)]
[im 1/49]
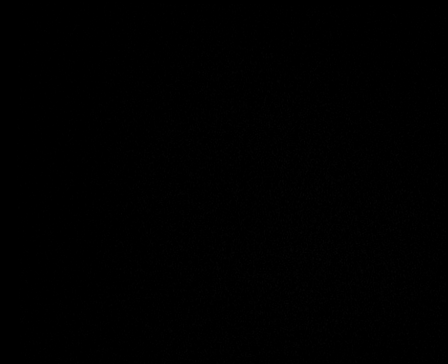
[im 9/49]
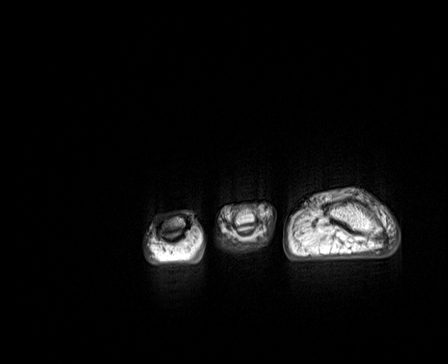
[im 17/49]
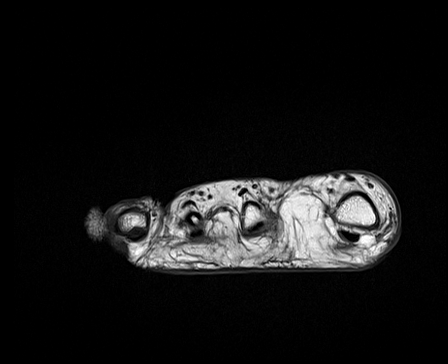
[im 25/49]
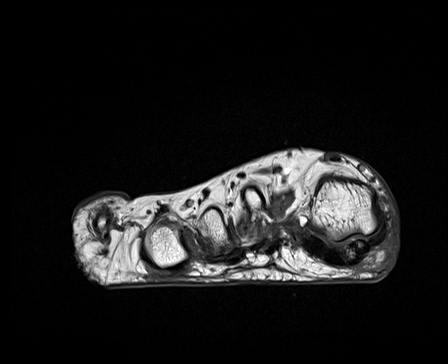
[im 33/49]
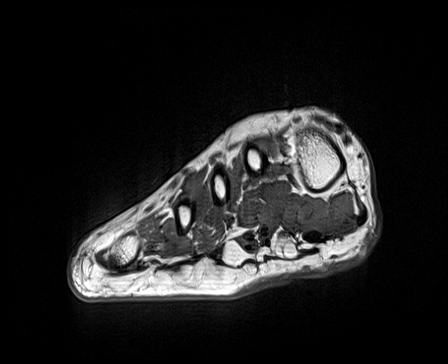
[im 41/49]
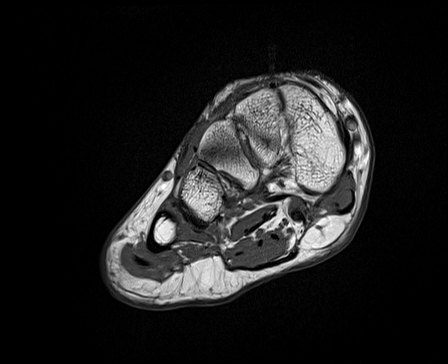
[im 49/49]
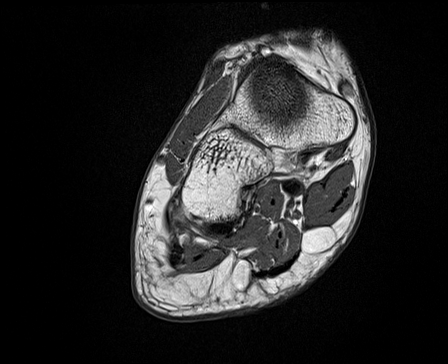

[Series 7: T2 fat-sat · coronal · right · 3.0mm · 0.27mm/px · 7 of 49 slices shown (1 of 3)]
[im 1/49]
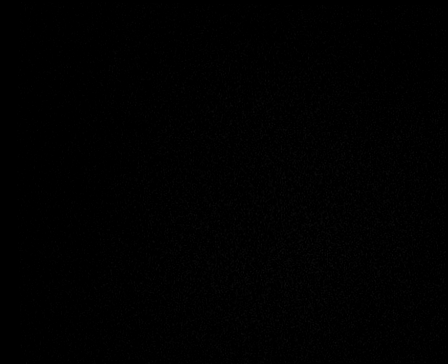
[im 9/49]
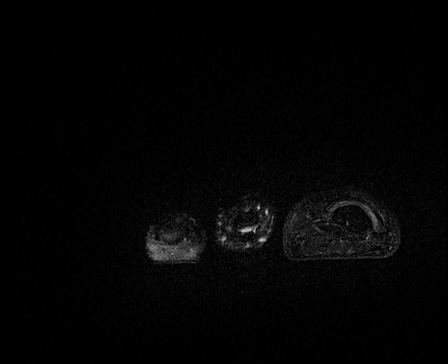
[im 17/49]
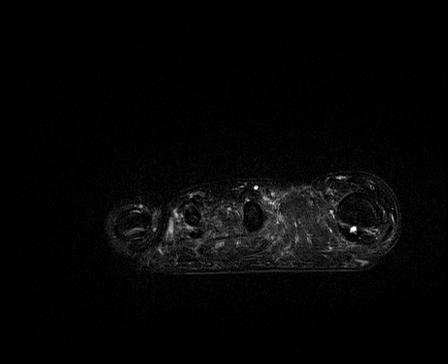
[im 25/49]
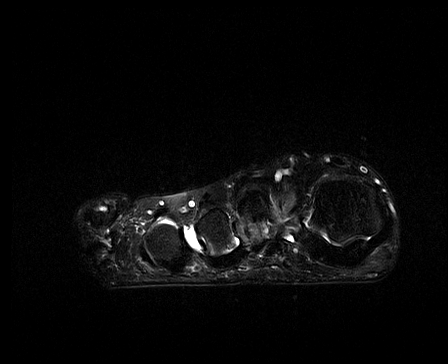
[im 33/49]
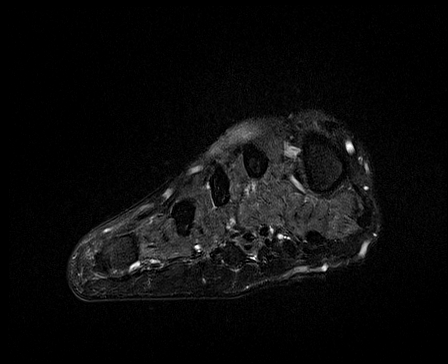
[im 41/49]
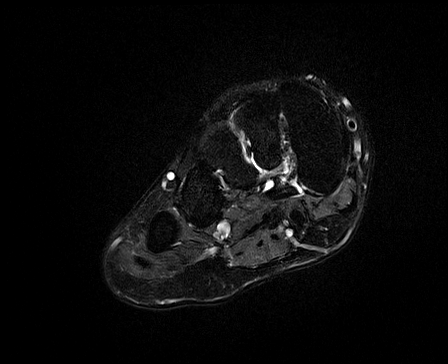
[im 49/49]
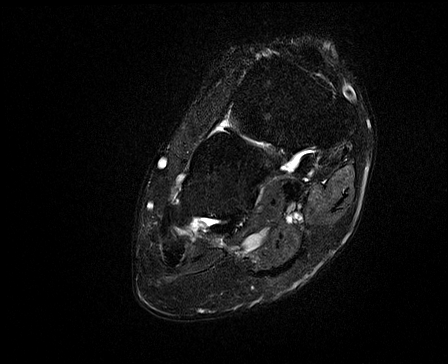

[Series 8: T2 fat-sat · sagittal · right · 3.0mm · 0.42mm/px · 6 of 36 slices shown (2 of 3)]
[im 1/36]
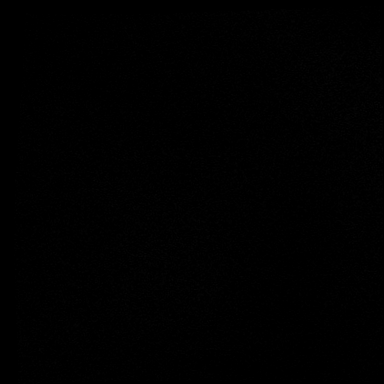
[im 8/36]
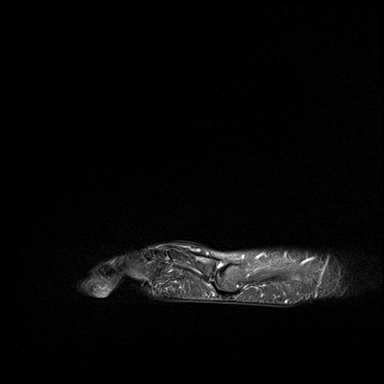
[im 15/36]
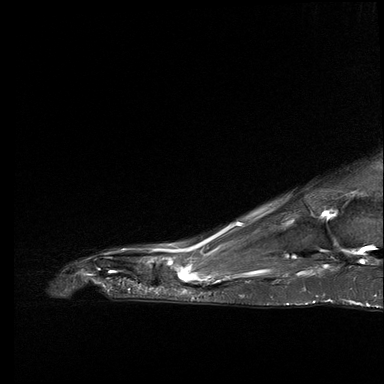
[im 22/36]
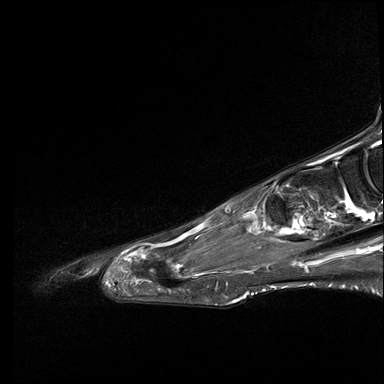
[im 29/36]
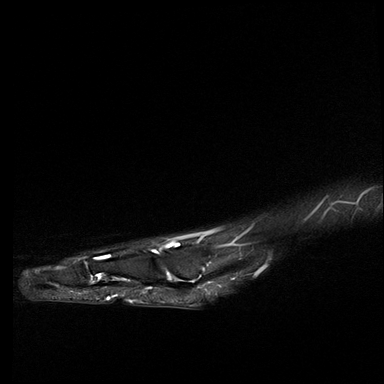
[im 36/36]
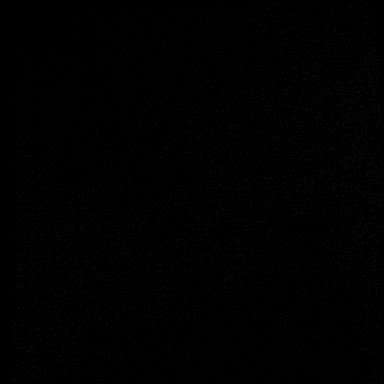

[Series 9: T1 · axial · right · 2.5mm · 0.36mm/px · z∈[-63,+14]mm · 5 of 30 slices shown (2 of 2)]
[im 1/30]
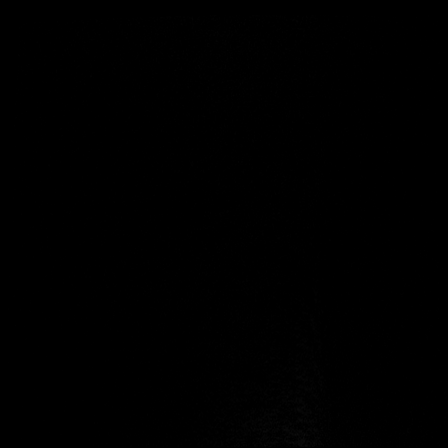
[im 8/30]
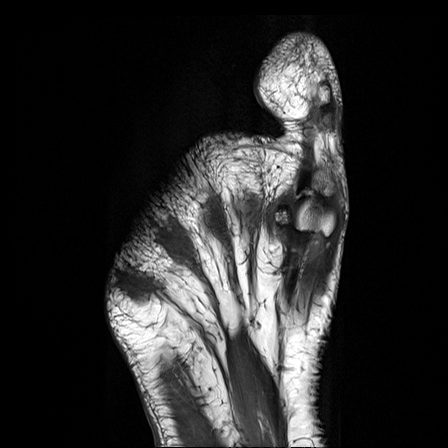
[im 15/30]
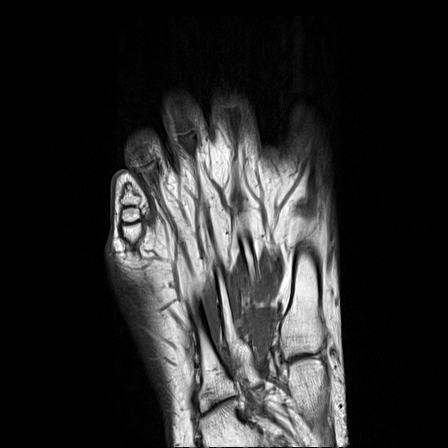
[im 22/30]
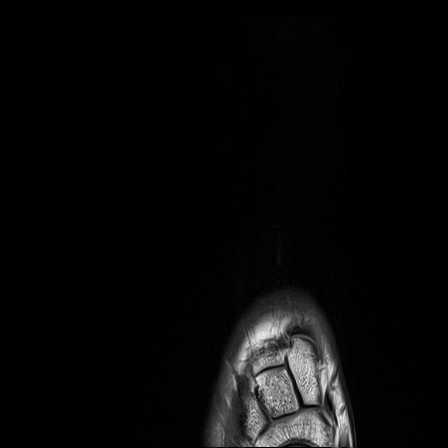
[im 30/30]
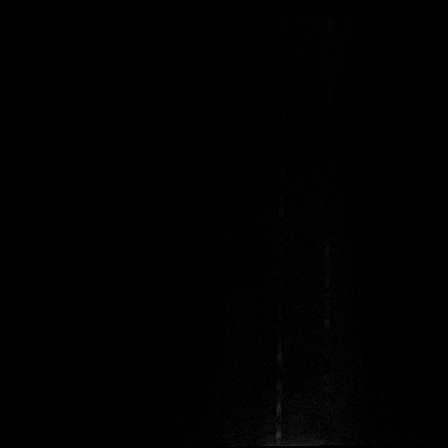

[Series 10: T2 fat-sat · axial · right · 2.5mm · 0.36mm/px · z∈[-63,+14]mm · 5 of 30 slices shown (3 of 3)]
[im 1/30]
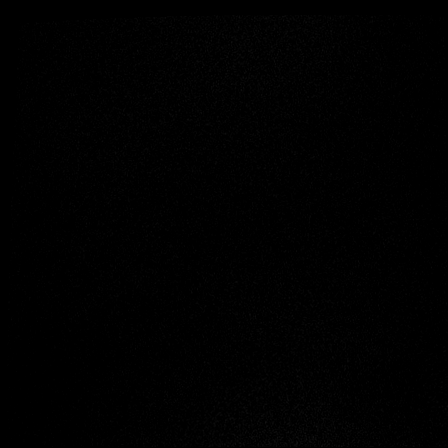
[im 8/30]
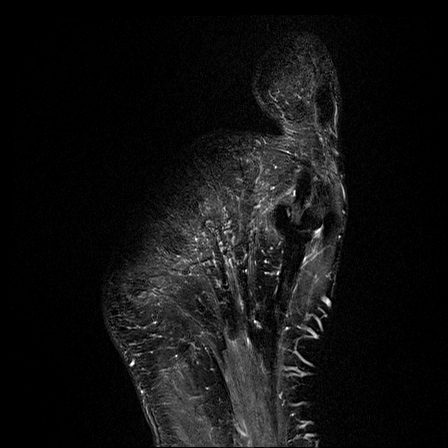
[im 15/30]
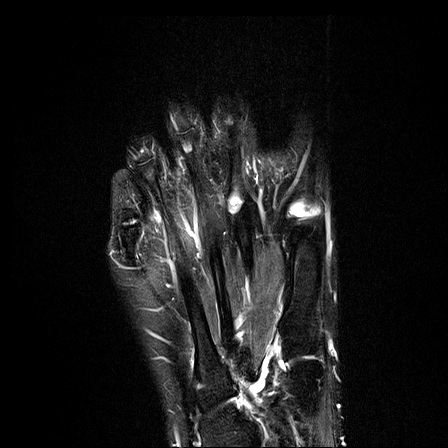
[im 22/30]
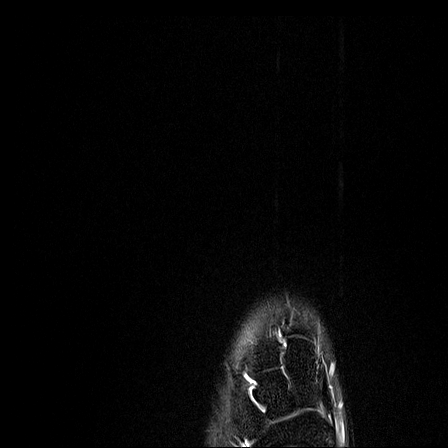
[im 30/30]
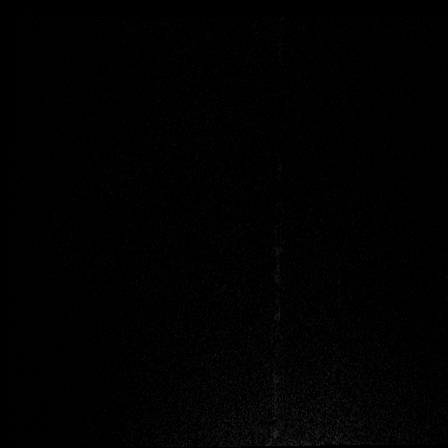

[Series 11: STIR · sagittal · right · 3.0mm · 0.53mm/px · 2 of 32 slices shown]
[im 1/32]
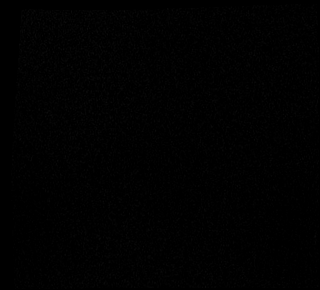
[im 8/32]
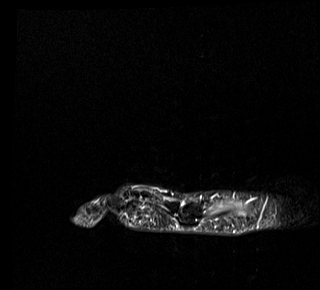

[32 of 40 positions shown; findings below may reference images not displayed]

FINDINGS: The bony structures are unremarkable. No stress fracture. The joint
spaces are maintained. No erosive findings. There are small
metatarsal phalangeal joint effusions. There is also intermetatarsal
bursitis at the first, second and third web spaces. No obvious
webspace mass or nodule to suggest a Morton's neuroma.

The foot musculature appears normal. No muscle lesion or myositis.
No subcutaneous lesions. The major tendons and ligaments appear
intact.
IMPRESSION: 1. No acute or significant bony findings. No stress fracture or
avascular necrosis.
2. Intermetatarsal bursitis.
3. Small metatarsal phalangeal joint effusions but no erosive
changes or inflammatory changes.

## 2017-12-22 DIAGNOSIS — M5441 Lumbago with sciatica, right side: Secondary | ICD-10-CM | POA: Diagnosis not present

## 2018-01-11 DIAGNOSIS — M109 Gout, unspecified: Secondary | ICD-10-CM | POA: Diagnosis not present

## 2018-01-11 DIAGNOSIS — M79671 Pain in right foot: Secondary | ICD-10-CM | POA: Diagnosis not present

## 2018-01-11 DIAGNOSIS — M79672 Pain in left foot: Secondary | ICD-10-CM | POA: Diagnosis not present

## 2018-01-23 ENCOUNTER — Other Ambulatory Visit: Payer: Self-pay | Admitting: *Deleted

## 2018-01-23 DIAGNOSIS — M1990 Unspecified osteoarthritis, unspecified site: Secondary | ICD-10-CM | POA: Diagnosis not present

## 2018-01-23 DIAGNOSIS — M79672 Pain in left foot: Principal | ICD-10-CM

## 2018-01-23 DIAGNOSIS — M79671 Pain in right foot: Secondary | ICD-10-CM

## 2018-01-24 ENCOUNTER — Ambulatory Visit (INDEPENDENT_AMBULATORY_CARE_PROVIDER_SITE_OTHER): Payer: BLUE CROSS/BLUE SHIELD | Admitting: Neurology

## 2018-01-24 DIAGNOSIS — M79671 Pain in right foot: Secondary | ICD-10-CM | POA: Diagnosis not present

## 2018-01-24 DIAGNOSIS — E1143 Type 2 diabetes mellitus with diabetic autonomic (poly)neuropathy: Secondary | ICD-10-CM

## 2018-01-24 DIAGNOSIS — M79672 Pain in left foot: Secondary | ICD-10-CM

## 2018-01-24 NOTE — Procedures (Signed)
[facility]  [address], [address]  [place], [place] [address] Tel: [phone] Fax:  [phone] Test Date:  01/24/2018  Patient: Briana Fuentes DOB: [birth year] Physician: Narda Amber, DO  Sex: Female Height: 5\' 3"  Ref Phys: Letta Pate, MD  ID#: [id number] Temp: 31.0C Technician:    Patient Complaints: This is a 42 year old female with small fiber diabetic neuropathy referred for evaluation of worsening bilateral leg pain and paresthesias.  NCV & EMG Findings: Extensive electrodiagnostic testing of the right lower extremity and additional studies of the left shows:  1. Right superficial peroneal sensory response shows reduced amplitude (4.8 V).  Left superficial peroneal sensory response is absent.  Bilateral sural sensory responses are within normal limits. 2. Left tibial motor response shows prolonged distal onset latency (6.1 ms), reduced amplitude (6.1 mV), and decreased conduction velocity (Knee-Ankle, 34 m/s).  Right tibial and bilateral peroneal motor responses are within normal limits.   3. Bilateral tibial H reflex studies are within normal limits.   4. Sparse chronic motor axonal loss changes are seen affecting bilateral flexor digitorum longus muscles, without accompanied active denervation.  The remaining tested muscles show normal motor unit recruitment and configuration.    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  Overall, these findings are moderate in degree electrically.   ___________________________ Narda Amber, DO    Nerve Conduction Studies Anti Sensory Summary Table   Site NR Peak (ms) Norm Peak (ms) P-T Amp (V) Norm P-T Amp  Left Sup Peroneal Anti Sensory (Ant Lat Mall)  31C  12 cm NR  <4.5  >5  Right Sup Peroneal Anti Sensory (Ant Lat Mall)  31C  12 cm    3.3 <4.5 4.8 >5  Left Sural Anti Sensory (Lat Mall)  31C    Calf    4.5 <4.5 10.6 >5  Right Sural Anti Sensory (Lat Mall)  31C  Calf    4.2 <4.5 13.1 >5   Motor Summary Table   Site NR Onset (ms) Norm Onset (ms) O-P Amp (mV) Norm O-P Amp Site1 Site2 Delta-0 (ms) Dist (cm) Vel (m/s) Norm Vel (m/s)  Left Peroneal Motor (Ext Dig Brev)  31C  Ankle    4.4 <5.5 3.3 >3 B Fib Ankle 8.7 36.0 41 >40  B Fib    13.1  2.8  Poplt B Fib 1.5 8.0 53 >40  Poplt    14.6  2.9         Right Peroneal Motor (Ext Dig Brev)  31C  Ankle    4.2 <5.5 3.9 >3 B Fib Ankle 8.7 36.0 41 >40  B Fib    12.9  3.5  Poplt B Fib 1.2 7.0 58 >40  Poplt    14.1  3.3         Left Tibial Motor (Abd Hall Brev)  31C  Ankle    6.1 <6.0 6.1 >8 Knee Ankle 11.9 40.0 34 >40  Knee    18.5  3.5         Site 5    4.5  5.7         Right Tibial Motor (Abd Hall Brev)  31C  Ankle    4.6 <6.0 9.1 >8 Knee Ankle 10.2 42.0 41 >40  Knee    14.8  8.0          H Reflex Studies   NR H-Lat (ms) Lat Norm (ms) L-R H-Lat (ms)  Left  Tibial (Gastroc)  31C     34.29 <35 0.00  Right Tibial (Gastroc)  31C     34.29 <35 0.00   EMG   Side Muscle Ins Act Fibs Psw Fasc Number Recrt Dur Dur. Amp Amp. Poly Poly. Comment  Right AntTibialis Nml Nml Nml Nml Nml Nml Nml Nml Nml Nml Nml Nml N/A  Right Flex Dig Long Nml Nml Nml Nml 2- Rapid Some 1+ Some 1+ Nml Nml N/A  Right Gastroc Nml Nml Nml Nml Nml Nml Nml Nml Nml Nml Nml Nml N/A  Right RectFemoris Nml Nml Nml Nml Nml Nml Nml Nml Nml Nml Nml Nml N/A  Right GluteusMed Nml Nml Nml Nml Nml Nml Nml Nml Nml Nml Nml Nml N/A  Left AntTibialis Nml Nml Nml Nml Nml Nml Nml Nml Nml Nml Nml Nml N/A  Left Flex Dig Long Nml Nml Nml Nml 2- Rapid Some 1+ Some 1+ Nml Nml N/A  Left Gastroc Nml Nml Nml Nml Nml Nml Nml Nml Nml Nml Nml Nml N/A  Left RectFemoris Nml Nml Nml Nml Nml Nml Nml Nml Nml Nml Nml Nml N/A  Left GluteusMed Nml Nml Nml Nml Nml Nml Nml Nml Nml Nml Nml Nml N/A      Waveforms:

## 2018-02-01 DIAGNOSIS — H25043 Posterior subcapsular polar age-related cataract, bilateral: Secondary | ICD-10-CM | POA: Diagnosis not present

## 2018-02-01 DIAGNOSIS — H524 Presbyopia: Secondary | ICD-10-CM | POA: Diagnosis not present

## 2018-02-07 ENCOUNTER — Encounter: Payer: BLUE CROSS/BLUE SHIELD | Admitting: Neurology

## 2018-02-07 DIAGNOSIS — M79672 Pain in left foot: Secondary | ICD-10-CM | POA: Diagnosis not present

## 2018-02-07 DIAGNOSIS — M79671 Pain in right foot: Secondary | ICD-10-CM | POA: Diagnosis not present

## 2018-02-07 DIAGNOSIS — E1140 Type 2 diabetes mellitus with diabetic neuropathy, unspecified: Secondary | ICD-10-CM | POA: Diagnosis not present

## 2018-02-09 ENCOUNTER — Other Ambulatory Visit: Payer: Self-pay | Admitting: Podiatry

## 2018-02-09 ENCOUNTER — Ambulatory Visit: Payer: BLUE CROSS/BLUE SHIELD | Admitting: Podiatry

## 2018-02-09 ENCOUNTER — Encounter: Payer: Self-pay | Admitting: Podiatry

## 2018-02-09 ENCOUNTER — Ambulatory Visit (INDEPENDENT_AMBULATORY_CARE_PROVIDER_SITE_OTHER): Payer: BLUE CROSS/BLUE SHIELD

## 2018-02-09 DIAGNOSIS — M79672 Pain in left foot: Principal | ICD-10-CM

## 2018-02-09 DIAGNOSIS — M779 Enthesopathy, unspecified: Secondary | ICD-10-CM

## 2018-02-09 DIAGNOSIS — E0500 Thyrotoxicosis with diffuse goiter without thyrotoxic crisis or storm: Secondary | ICD-10-CM | POA: Insufficient documentation

## 2018-02-09 DIAGNOSIS — M79671 Pain in right foot: Secondary | ICD-10-CM

## 2018-02-09 DIAGNOSIS — G629 Polyneuropathy, unspecified: Secondary | ICD-10-CM | POA: Diagnosis not present

## 2018-02-09 DIAGNOSIS — M7751 Other enthesopathy of right foot: Secondary | ICD-10-CM | POA: Diagnosis not present

## 2018-02-09 MED ORDER — TRIAMCINOLONE ACETONIDE 10 MG/ML IJ SUSP
10.0000 mg | Freq: Once | INTRAMUSCULAR | Status: AC
  Administered 2018-02-09: 10 mg

## 2018-02-10 NOTE — Progress Notes (Signed)
Subjective:   Patient ID: Briana Fuentes, female   DOB: 42 y.o.   MRN: [id number]   HPI Patient continues to deal with moderate neuropathic-like discomfort that has developed quite a bit more forefoot pain right and left then what was present several years ago when we saw her.  She is on Lyrica and had nerve conduction which was positive for change   ROS      Objective:  Physical Exam  Neurovascular status unchanged from previous visit with muscle strength adequate range of motion within normal limits with patient found to have exquisite discomfort second and third metatarsal phalangeal joints with moderate elevation of the toes of the flexible nature right over left foot.  Patient has good digital perfusion well oriented     Assessment:  Possibility for inflammatory capsulitis along with neuropathic-like symptoms which appear to be idiopathic with diabetes that is under for the most part good control possible precipitating factor     Plan:  H&P condition reviewed and I discussed this with her at great length.  I will get a try to work on these right joints and see what kind of relief we can get with the consideration of long-term for orthotics and even possibly shortening osteotomies.  I did go ahead today and I did proximal nerve block of the right foot and then using sterile instrumentation sterile injections I did aspirate the second and third MPJs getting out a small amount of clear fluid and injected quarter cc dexamethasone Kenalog into each joint and applied padding to reduce pressure on the joint surfaces.  Advised on rigid bottom shoes and reappoint to recheck in the next few weeks  X-rays indicate that there is moderately elongated second and third metatarsals bilateral with no indications arthritis stress fracture

## 2018-03-09 ENCOUNTER — Ambulatory Visit: Payer: BLUE CROSS/BLUE SHIELD | Admitting: Podiatry

## 2018-03-22 DIAGNOSIS — G609 Hereditary and idiopathic neuropathy, unspecified: Secondary | ICD-10-CM | POA: Diagnosis not present

## 2018-03-22 DIAGNOSIS — E039 Hypothyroidism, unspecified: Secondary | ICD-10-CM | POA: Diagnosis not present

## 2018-03-22 DIAGNOSIS — E559 Vitamin D deficiency, unspecified: Secondary | ICD-10-CM | POA: Diagnosis not present

## 2018-03-22 DIAGNOSIS — E1165 Type 2 diabetes mellitus with hyperglycemia: Secondary | ICD-10-CM | POA: Diagnosis not present

## 2018-03-28 DIAGNOSIS — Z3141 Encounter for fertility testing: Secondary | ICD-10-CM | POA: Diagnosis not present

## 2018-03-28 DIAGNOSIS — Z3169 Encounter for other general counseling and advice on procreation: Secondary | ICD-10-CM | POA: Diagnosis not present

## 2018-06-04 ENCOUNTER — Ambulatory Visit (HOSPITAL_COMMUNITY)
Admission: EM | Admit: 2018-06-04 | Discharge: 2018-06-04 | Disposition: A | Discharge status: Home / Self Care | Payer: BLUE CROSS/BLUE SHIELD | Attending: Family Medicine | Admitting: Family Medicine

## 2018-06-04 ENCOUNTER — Other Ambulatory Visit: Payer: Self-pay

## 2018-06-04 ENCOUNTER — Encounter (HOSPITAL_COMMUNITY): Payer: Self-pay

## 2018-06-04 DIAGNOSIS — H66002 Acute suppurative otitis media without spontaneous rupture of ear drum, left ear: Secondary | ICD-10-CM

## 2018-06-04 MED ORDER — AMOXICILLIN 875 MG PO TABS: 875.0000 mg | ORAL_TABLET | Freq: Two times a day (BID) | ORAL | 0 refills | Status: DC

## 2018-06-04 MED ORDER — TRIAMCINOLONE ACETONIDE 55 MCG/ACT NA AERO: 2.0000 | INHALATION_SPRAY | Freq: Every day | NASAL | 0 refills | Status: DC

## 2018-06-04 NOTE — ED Provider Notes (Signed)
[place]    CSN: [id number] Arrival date & time: 06/04/18  1725     History   Chief Complaint Chief Complaint  Patient presents with  . Ear Problem    HPI Briana Fuentes is a 43 y.o. female.   HPI Well controlled DM by history Sees PCP every 3-6 months Here today for ear pain The ear has decreased hearing.  Feels full.  She has been trying over-the-counter eardrops.  It is not helping.  No cold symptoms, runny stuffy nose, sore throat, fever or chills.  No coughing.  No prior problems with ear infection Past Medical History:  Diagnosis Date  . Diabetes mellitus without complication (HCC)    Gestational diabetes  . Gout   . Hypothyroidism   . Infertility, female   . Neuropathy     Patient Active Problem List   Diagnosis Date Noted  . Graves disease 02/09/2018  . Chronic pain of toe of left foot 04/25/2017  . Hyperthyroidism 12/22/2015  . Dry eye 12/18/2013  . 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 Posterior subcapsular cataract 12/18/2013  . AMA (advanced maternal age) multigravida 35+ 05/01/2012  . DM w/o complication type II ([place]) 05/01/2012  . Diabetes mellitus, antepartum(648.03) 04/19/2012  . Hypothyroidism, postsurgical 04/21/2006    Past Surgical History:  Procedure Laterality Date  . CESAREAN SECTION N/A 08/10/2012   Procedure: CESAREAN SECTION;  Surgeon: Mora Bellman, MD;  Location: [facility];  Service: Obstetrics;  Laterality: N/A;  Primary Cesarean Section Delivery Baby Girl @ 2126, Apgars 9/9  . THYROIDECTOMY      OB History    Gravida  4   Para  3   Term  1   Preterm  2   AB  0   Living  3     SAB  0   TAB  0   Ectopic  0   Multiple  0   Live Births  3            Home Medications    Prior to Admission medications   Medication Sig Start Date End Date Taking? Authorizing Provider  amoxicillin (AMOXIL) 875 MG tablet Take 1 tablet (875 mg total) by  mouth 2 (two) times daily. 06/04/18   Raylene Everts, MD  insulin aspart (NOVOLOG) 100 UNIT/ML injection Inject 10 Units into the skin daily.    [provider]  LEVEMIR FLEXTOUCH 100 UNIT/ML Pen  11/03/15   [provider]  levothyroxine (SYNTHROID, LEVOTHROID) 150 MCG tablet Take 150 mcg by mouth daily before breakfast.    [provider]  metFORMIN (GLUCOPHAGE-XR) 500 MG 24 hr tablet TAKE 1 TABLET BY MOUTH TWICE A DAY WITH MEALS 12/31/12   Constant, [name], MD  pregabalin (LYRICA) 100 MG capsule Take 150 mg by mouth 2 (two) times daily.     [provider]  triamcinolone (NASACORT) 55 MCG/ACT AERO nasal inhaler Place 2 sprays into the nose daily. 06/04/18   Raylene Everts, MD    Family History Family History  Problem Relation Age of Onset  . Heart disease Father   . Stroke Father   . Diabetes Mellitus II Father   . Kidney failure Father        on dialysis  . Healthy Mother   . Healthy Brother   . Healthy Sister   . Healthy Daughter   . Healthy Son     Social History Social History   Tobacco  Use  . Smoking status: Never Smoker  . Smokeless tobacco: Never Used  Substance Use Topics  . Alcohol use: No  . Drug use: No     Allergies   Patient has no known allergies.   Review of Systems Review of Systems  Constitutional: Negative for chills and fever.  HENT: Positive for ear pain and hearing loss. Negative for sore throat.   Eyes: Negative for pain and visual disturbance.  Respiratory: Negative for cough and shortness of breath.   Cardiovascular: Negative for chest pain and palpitations.  Gastrointestinal: Negative for abdominal pain and vomiting.  Genitourinary: Negative for dysuria and hematuria.  Musculoskeletal: Negative for arthralgias and back pain.  Skin: Negative for color change and rash.  Neurological: Negative for seizures and syncope.  All other systems reviewed and are negative.    Physical Exam   No data  found.  Updated Vital Signs BP 109/71 (BP Location: Right Arm)   Pulse 91   Temp 98.2 F (36.8 C) (Oral)   Resp 16   LMP 06/01/2018   SpO2 100%       Physical Exam Constitutional:      General: She is not in acute distress.    Appearance: She is well-developed.  HENT:     Head: Normocephalic and atraumatic.     Right Ear: Tympanic membrane, ear canal and external ear normal. There is no impacted cerumen.     Left Ear: Ear canal and external ear normal. There is no impacted cerumen.     Ears:     Comments: TM is dull, irregular appearance, mild injection, bulging    Nose: Nose normal.     Mouth/Throat:     Mouth: Mucous membranes are moist.     Pharynx: No posterior oropharyngeal erythema.  Eyes:     Conjunctiva/sclera: Conjunctivae normal.     Pupils: Pupils are equal, round, and reactive to light.  Neck:     Musculoskeletal: Normal range of motion.  Cardiovascular:     Rate and Rhythm: Normal rate and regular rhythm.     Heart sounds: Normal heart sounds.  Pulmonary:     Effort: Pulmonary effort is normal. No respiratory distress.     Breath sounds: Normal breath sounds.  Abdominal:     General: There is no distension.     Palpations: Abdomen is soft.  Musculoskeletal: Normal range of motion.  Lymphadenopathy:     Cervical: No cervical adenopathy.  Skin:    General: Skin is warm and dry.  Neurological:     Mental Status: She is alert.  Psychiatric:        Mood and Affect: Mood normal.        Behavior: Behavior normal.      UC Treatments / Results  Labs (all labs ordered are listed, but only abnormal results are displayed) Labs Reviewed - No data to display  EKG None  Radiology No results found.  Procedures Procedures (including critical care time)  Medications Ordered in UC Medications - No data to display  Initial Impression / Assessment and Plan / UC Course  I have reviewed the triage vital signs and the nursing notes.  Pertinent labs &  imaging results that were available during my care of the patient were reviewed by me and considered in my medical decision making (see chart for details).     Discussed patient has an ear infection.  We will give her Nasacort for the eustachian tube dysfunction.  Amoxicillin twice daily for  7 days.  Push fluids.  Follow-up with PCP Final Clinical Impressions(s) / UC Diagnoses   Final diagnoses:  Non-recurrent acute suppurative otitis media of left ear without spontaneous rupture of tympanic membrane     Discharge Instructions     Take the amoxicillin 2 times a day for 7 days Use the nasal spray until your ears feel back to normal The nasal spray will help normalize the pressure in your ears You may take ibuprofen or Tylenol as needed for pain Call your doctor if not improved by next week    ED Prescriptions    Medication Sig Dispense Auth. Provider   triamcinolone (NASACORT) 55 MCG/ACT AERO nasal inhaler Place 2 sprays into the nose daily. 1 Inhaler Raylene Everts, MD   amoxicillin (AMOXIL) 875 MG tablet Take 1 tablet (875 mg total) by mouth 2 (two) times daily. 14 tablet Raylene Everts, MD     Controlled Substance Prescriptions [place] Controlled Substance Registry consulted?no   Raylene Everts, MD 06/04/18 1758

## 2018-06-04 NOTE — Discharge Instructions (Signed)
Take the amoxicillin 2 times a day for 7 days Use the nasal spray until your ears feel back to normal The nasal spray will help normalize the pressure in your ears You may take ibuprofen or Tylenol as needed for pain Call your doctor if not improved by next week

## 2018-06-04 NOTE — ED Triage Notes (Signed)
Patient presents to Urgent Care with complaints of bilateral ear pain and fullness since a week ago. Patient states she has tried olive oil drops and OTC pain drops but it got worse.

## 2018-06-14 ENCOUNTER — Other Ambulatory Visit: Payer: Self-pay

## 2018-06-14 ENCOUNTER — Ambulatory Visit (HOSPITAL_COMMUNITY)
Admission: EM | Admit: 2018-06-14 | Discharge: 2018-06-14 | Disposition: A | Discharge status: Home / Self Care | Payer: BLUE CROSS/BLUE SHIELD | Attending: Family Medicine | Admitting: Family Medicine

## 2018-06-14 ENCOUNTER — Encounter (HOSPITAL_COMMUNITY): Payer: Self-pay | Admitting: Emergency Medicine

## 2018-06-14 DIAGNOSIS — H9392 Unspecified disorder of left ear: Secondary | ICD-10-CM

## 2018-06-14 DIAGNOSIS — H6122 Impacted cerumen, left ear: Secondary | ICD-10-CM

## 2018-06-14 DIAGNOSIS — H6982 Other specified disorders of Eustachian tube, left ear: Secondary | ICD-10-CM

## 2018-06-14 MED ORDER — FLUTICASONE PROPIONATE 50 MCG/ACT NA SUSP: 1.0000 | Freq: Every day | NASAL | 0 refills | Status: DC

## 2018-06-14 MED ORDER — CETIRIZINE HCL 10 MG PO CAPS: 10.0000 mg | ORAL_CAPSULE | Freq: Every day | ORAL | 0 refills | Status: DC

## 2018-06-14 NOTE — ED Provider Notes (Addendum)
[place]    CSN: [id number] Arrival date & time: 06/14/18  1432     History   Chief Complaint Chief Complaint  Patient presents with  . Otalgia    HPI Briana Fuentes is a 43 y.o. female history of DM type I, Graves' disease, presenting today for evaluation of left ear discomfort.  Patient states that she has had a fullness, muffled sensation in her left ear for the past 2 weeks.  She is starting to have some slight symptoms on the right side.  Denies pain.  Denies drainage.  She was seen here 10 days ago and treated for otitis media with amoxicillin and started on Nasacort.  She has not had much improvement in her symptoms.  She feels occasionally her symptoms will be relieved after coughing/sneezing.  Feels as if her ears popped and she is on an airplane.  Denies fevers.  Denies associated cough, congestion or sore throat.  HPI  Past Medical History:  Diagnosis Date  . Diabetes mellitus without complication (HCC)    Gestational diabetes  . Gout   . Hypothyroidism   . Infertility, female   . Neuropathy     Patient Active Problem List   Diagnosis Date Noted  . Graves disease 02/09/2018  . Chronic pain of toe of left foot 04/25/2017  . Hyperthyroidism 12/22/2015  . Dry eye 12/18/2013  . 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 Posterior subcapsular cataract 12/18/2013  . AMA (advanced maternal age) multigravida 35+ 05/01/2012  . DM w/o complication type II ([place]) 05/01/2012  . Diabetes mellitus, antepartum(648.03) 04/19/2012  . Hypothyroidism, postsurgical 04/21/2006    Past Surgical History:  Procedure Laterality Date  . CESAREAN SECTION N/A 08/10/2012   Procedure: CESAREAN SECTION;  Surgeon: Mora Bellman, MD;  Location: [facility];  Service: Obstetrics;  Laterality: N/A;  Primary Cesarean Section Delivery Baby Girl @ 2126, Apgars 9/9  . THYROIDECTOMY      OB History    Gravida  4   Para  3   Term  1   Preterm  2   AB  0   Living  3     SAB  0   TAB  0   Ectopic  0   Multiple  0   Live Births  3            Home Medications    Prior to Admission medications   Medication Sig Start Date End Date Taking? Authorizing Provider  amoxicillin (AMOXIL) 875 MG tablet Take 1 tablet (875 mg total) by mouth 2 (two) times daily. 06/04/18   Raylene Everts, MD  Cetirizine HCl 10 MG CAPS Take 1 capsule (10 mg total) by mouth daily for 10 days. 06/14/18 06/24/18  ,  C, PA-C  fluticasone (FLONASE) 50 MCG/ACT nasal spray Place 1-2 sprays into both nostrils daily for 7 days. 06/14/18 06/21/18  ,  C, PA-C  insulin aspart (NOVOLOG) 100 UNIT/ML injection Inject 10 Units into the skin daily.    [provider]  LEVEMIR FLEXTOUCH 100 UNIT/ML Pen  11/03/15   [provider]  levothyroxine (SYNTHROID, LEVOTHROID) 150 MCG tablet Take 150 mcg by mouth daily before breakfast.    [provider]  metFORMIN (GLUCOPHAGE-XR) 500 MG 24 hr tablet TAKE 1 TABLET BY MOUTH TWICE A DAY WITH MEALS 12/31/12   Constant, [name], MD  pregabalin (LYRICA) 100 MG capsule Take 150 mg by mouth 2 (two) times daily.  [provider]  triamcinolone (NASACORT) 55 MCG/ACT AERO nasal inhaler Place 2 sprays into the nose daily. 06/04/18   Raylene Everts, MD    Family History Family History  Problem Relation Age of Onset  . Heart disease Father   . Stroke Father   . Diabetes Mellitus II Father   . Kidney failure Father        on dialysis  . Healthy Mother   . Healthy Brother   . Healthy Sister   . Healthy Daughter   . Healthy Son     Social History Social History   Tobacco Use  . Smoking status: Never Smoker  . Smokeless tobacco: Never Used  Substance Use Topics  . Alcohol use: No  . Drug use: No     Allergies   Patient has no known allergies.   Review of Systems Review of Systems  Constitutional: Negative for activity change,  appetite change, chills, fatigue and fever.  HENT: Positive for hearing loss. Negative for congestion, ear pain, rhinorrhea, sinus pressure, sore throat and trouble swallowing.   Eyes: Negative for discharge and redness.  Respiratory: Negative for cough, chest tightness and shortness of breath.   Cardiovascular: Negative for chest pain.  Gastrointestinal: Negative for abdominal pain, diarrhea, nausea and vomiting.  Musculoskeletal: Negative for myalgias.  Skin: Negative for rash.  Neurological: Negative for dizziness, light-headedness and headaches.     Physical Exam Triage Vital Signs ED Triage Vitals  Enc Vitals Group     BP 06/14/18 1505 130/61     Pulse Rate 06/14/18 1505 87     Resp 06/14/18 1505 18     Temp 06/14/18 1505 98.2 F (36.8 C)     Temp Source 06/14/18 1505 Oral     SpO2 06/14/18 1505 100 %     Weight --      Height --      Head Circumference --      Peak Flow --      Pain Score 06/14/18 1506 5     Pain Loc --      Pain Edu? --      Excl. in [place]? --    No data found.  Updated Vital Signs BP 130/61 (BP Location: Right Arm)   Pulse 87   Temp 98.2 F (36.8 C) (Oral)   Resp 18   LMP 06/01/2018   SpO2 100%   Visual Acuity Right Eye Distance:   Left Eye Distance:   Bilateral Distance:    Right Eye Near:   Left Eye Near:    Bilateral Near:     Physical Exam Vitals signs and nursing note reviewed.  Constitutional:      General: She is not in acute distress.    Appearance: She is well-developed.  HENT:     Head: Normocephalic and atraumatic.     Ears:     Comments: Bilateral ears without tenderness to palpation of external auricle, tragus and mastoid,  left EAC with cerumen impaction limiting visualization of TM, after removal canal clear without erythema or edema, TM intact without erythema.  TM's with good bony landmarks and cone of light. Non erythematous.    Nose:     Comments: Nasal mucosa slightly erythematous, nonswollen turbinates     Mouth/Throat:     Comments: Oral mucosa pink and moist, no tonsillar enlargement or exudate. Posterior pharynx patent and nonerythematous, no uvula deviation or swelling. Normal phonation.  Eyes:     Conjunctiva/sclera: Conjunctivae normal.  Neck:  Musculoskeletal: Neck supple.  Cardiovascular:     Rate and Rhythm: Normal rate and regular rhythm.     Heart sounds: No murmur.  Pulmonary:     Effort: Pulmonary effort is normal. No respiratory distress.     Breath sounds: Normal breath sounds.  Abdominal:     Palpations: Abdomen is soft.     Tenderness: There is no abdominal tenderness.  Skin:    General: Skin is warm and dry.  Neurological:     Mental Status: She is alert.      UC Treatments / Results  Labs (all labs ordered are listed, but only abnormal results are displayed) Labs Reviewed - No data to display  EKG None  Radiology No results found.  Procedures Procedures (including critical care time)  Medications Ordered in UC Medications - No data to display  Initial Impression / Assessment and Plan / UC Course  I have reviewed the triage vital signs and the nursing notes.  Pertinent labs & imaging results that were available during my care of the patient were reviewed by me and considered in my medical decision making (see chart for details).    Cerumen removal by irrigation by nursing staff Patient no longer has signs of otitis media.  Most likely eustachian tube dysfunction given description of symptoms and benign exam.  Will recommend continued steroid nasal spray, will try Flonase as alternative to Nasacort.  Will also initiate on daily antihistamine.  Advised to continue to use over the next 2 to 3 weeks, follow-up with ENT if symptoms persisting.Discussed strict return precautions. Patient verbalized understanding and is agreeable with plan.  Final Clinical Impressions(s) / UC Diagnoses   Final diagnoses:  Dysfunction of left eustachian tube  Impacted  cerumen of left ear   Discharge Instructions   None    ED Prescriptions    Medication Sig Dispense Auth. Provider   fluticasone (FLONASE) 50 MCG/ACT nasal spray Place 1-2 sprays into both nostrils daily for 7 days. 1 g ,  C, PA-C   Cetirizine HCl 10 MG CAPS Take 1 capsule (10 mg total) by mouth daily for 10 days. 10 capsule ,  C, PA-C     Controlled Substance Prescriptions  Controlled Substance Registry consulted? Not Applicable   Janith Lima, PA-C 06/14/18 [address], [place] 06/29/18 [phone]

## 2018-06-14 NOTE — ED Triage Notes (Signed)
Pt sts left ear pain; pt seen 1 week ago for same and sts no improvement

## 2018-07-07 DIAGNOSIS — H15102 Unspecified episcleritis, left eye: Secondary | ICD-10-CM | POA: Diagnosis not present

## 2018-08-02 DIAGNOSIS — H6993 Unspecified Eustachian tube disorder, bilateral: Secondary | ICD-10-CM | POA: Diagnosis not present

## 2018-08-02 DIAGNOSIS — H6983 Other specified disorders of Eustachian tube, bilateral: Secondary | ICD-10-CM | POA: Diagnosis not present

## 2018-08-02 DIAGNOSIS — H532 Diplopia: Secondary | ICD-10-CM | POA: Diagnosis not present

## 2018-08-02 DIAGNOSIS — E0500 Thyrotoxicosis with diffuse goiter without thyrotoxic crisis or storm: Secondary | ICD-10-CM | POA: Diagnosis not present

## 2018-08-30 DIAGNOSIS — M7751 Other enthesopathy of right foot: Secondary | ICD-10-CM | POA: Diagnosis not present

## 2018-08-30 DIAGNOSIS — M7752 Other enthesopathy of left foot: Secondary | ICD-10-CM | POA: Diagnosis not present

## 2018-08-30 DIAGNOSIS — G5763 Lesion of plantar nerve, bilateral lower limbs: Secondary | ICD-10-CM | POA: Diagnosis not present

## 2018-09-06 DIAGNOSIS — M7752 Other enthesopathy of left foot: Secondary | ICD-10-CM | POA: Diagnosis not present

## 2018-09-06 DIAGNOSIS — M7751 Other enthesopathy of right foot: Secondary | ICD-10-CM | POA: Diagnosis not present

## 2018-09-06 DIAGNOSIS — G5763 Lesion of plantar nerve, bilateral lower limbs: Secondary | ICD-10-CM | POA: Diagnosis not present

## 2018-09-13 DIAGNOSIS — M7751 Other enthesopathy of right foot: Secondary | ICD-10-CM | POA: Diagnosis not present

## 2018-09-13 DIAGNOSIS — M7752 Other enthesopathy of left foot: Secondary | ICD-10-CM | POA: Diagnosis not present

## 2018-09-13 DIAGNOSIS — G5762 Lesion of plantar nerve, left lower limb: Secondary | ICD-10-CM | POA: Diagnosis not present

## 2018-09-19 DIAGNOSIS — E0500 Thyrotoxicosis with diffuse goiter without thyrotoxic crisis or storm: Secondary | ICD-10-CM | POA: Diagnosis not present

## 2018-09-19 DIAGNOSIS — E1165 Type 2 diabetes mellitus with hyperglycemia: Secondary | ICD-10-CM | POA: Diagnosis not present

## 2018-09-19 DIAGNOSIS — E559 Vitamin D deficiency, unspecified: Secondary | ICD-10-CM | POA: Diagnosis not present

## 2018-09-19 DIAGNOSIS — E039 Hypothyroidism, unspecified: Secondary | ICD-10-CM | POA: Diagnosis not present

## 2018-09-19 DIAGNOSIS — G609 Hereditary and idiopathic neuropathy, unspecified: Secondary | ICD-10-CM | POA: Diagnosis not present

## 2018-09-27 DIAGNOSIS — U071 COVID-19: Secondary | ICD-10-CM | POA: Diagnosis not present

## 2018-09-27 DIAGNOSIS — E1140 Type 2 diabetes mellitus with diabetic neuropathy, unspecified: Secondary | ICD-10-CM | POA: Diagnosis not present

## 2018-09-28 ENCOUNTER — Other Ambulatory Visit: Payer: Self-pay

## 2018-09-28 ENCOUNTER — Emergency Department (HOSPITAL_COMMUNITY): Payer: BC Managed Care – PPO

## 2018-09-28 ENCOUNTER — Emergency Department (HOSPITAL_COMMUNITY)
Admission: EM | Admit: 2018-09-28 | Discharge: 2018-09-28 | Disposition: A | Discharge status: Home / Self Care | Payer: BC Managed Care – PPO | Attending: Emergency Medicine | Admitting: Emergency Medicine

## 2018-09-28 DIAGNOSIS — E860 Dehydration: Secondary | ICD-10-CM

## 2018-09-28 DIAGNOSIS — Z794 Long term (current) use of insulin: Secondary | ICD-10-CM | POA: Diagnosis not present

## 2018-09-28 DIAGNOSIS — R112 Nausea with vomiting, unspecified: Secondary | ICD-10-CM

## 2018-09-28 DIAGNOSIS — M7918 Myalgia, other site: Secondary | ICD-10-CM | POA: Diagnosis not present

## 2018-09-28 DIAGNOSIS — R0902 Hypoxemia: Secondary | ICD-10-CM | POA: Diagnosis not present

## 2018-09-28 DIAGNOSIS — R197 Diarrhea, unspecified: Secondary | ICD-10-CM | POA: Insufficient documentation

## 2018-09-28 DIAGNOSIS — E039 Hypothyroidism, unspecified: Secondary | ICD-10-CM | POA: Diagnosis not present

## 2018-09-28 DIAGNOSIS — R51 Headache: Secondary | ICD-10-CM | POA: Diagnosis not present

## 2018-09-28 DIAGNOSIS — R509 Fever, unspecified: Secondary | ICD-10-CM | POA: Diagnosis not present

## 2018-09-28 DIAGNOSIS — U071 COVID-19: Secondary | ICD-10-CM

## 2018-09-28 DIAGNOSIS — R110 Nausea: Secondary | ICD-10-CM

## 2018-09-28 DIAGNOSIS — R05 Cough: Secondary | ICD-10-CM | POA: Diagnosis not present

## 2018-09-28 DIAGNOSIS — J189 Pneumonia, unspecified organism: Secondary | ICD-10-CM | POA: Diagnosis not present

## 2018-09-28 DIAGNOSIS — R000 Tachycardia, unspecified: Secondary | ICD-10-CM | POA: Diagnosis not present

## 2018-09-28 DIAGNOSIS — E119 Type 2 diabetes mellitus without complications: Secondary | ICD-10-CM | POA: Insufficient documentation

## 2018-09-28 DIAGNOSIS — Z20828 Contact with and (suspected) exposure to other viral communicable diseases: Secondary | ICD-10-CM | POA: Diagnosis not present

## 2018-09-28 LAB — COMPREHENSIVE METABOLIC PANEL
ALT: 30 U/L (ref 0–44)
AST: 28 U/L (ref 15–41)
Albumin: 3.3 g/dL — ABNORMAL LOW (ref 3.5–5.0)
Alkaline Phosphatase: 52 U/L (ref 38–126)
Anion gap: 12 (ref 5–15)
BUN: <5 mg/dL — ABNORMAL LOW (ref 6–20)
CO2: 24 mmol/L (ref 22–32)
Calcium: 8.4 mg/dL — ABNORMAL LOW (ref 8.9–10.3)
Chloride: 94 mmol/L — ABNORMAL LOW (ref 98–111)
Creatinine, Ser: 0.65 mg/dL (ref 0.44–1.00)
GFR calc Af Amer: >60 mL/min (ref >60)
GFR calc non Af Amer: >60 mL/min (ref >60)
Glucose, Bld: 179 mg/dL — ABNORMAL HIGH (ref 70–99)
Potassium: 3.5 mmol/L (ref 3.5–5.1)
Sodium: 130 mmol/L — ABNORMAL LOW (ref 135–145)
Total Bilirubin: 0.3 mg/dL (ref 0.3–1.2)
Total Protein: 7.8 g/dL (ref 6.5–8.1)

## 2018-09-28 LAB — CBC WITH DIFFERENTIAL/PLATELET
Abs Immature Granulocytes: 0.06 10*3/uL (ref 0.00–0.07)
Basophils Absolute: 0 10*3/uL (ref 0.0–0.1)
Basophils Relative: 0 %
Eosinophils Absolute: 0 10*3/uL (ref 0.0–0.5)
Eosinophils Relative: 0 %
HCT: 33.7 % — ABNORMAL LOW (ref 36.0–46.0)
Hemoglobin: 10.8 g/dL — ABNORMAL LOW (ref 12.0–15.0)
Immature Granulocytes: 1 %
Lymphocytes Relative: 19 %
Lymphs Abs: 1.2 10*3/uL (ref 0.7–4.0)
MCH: 25.2 pg — ABNORMAL LOW (ref 26.0–34.0)
MCHC: 32 g/dL (ref 30.0–36.0)
MCV: 78.7 fL — ABNORMAL LOW (ref 80.0–100.0)
Monocytes Absolute: 0.2 10*3/uL (ref 0.1–1.0)
Monocytes Relative: 3 %
Neutro Abs: 4.9 10*3/uL (ref 1.7–7.7)
Neutrophils Relative %: 77 %
Platelets: 187 10*3/uL (ref 150–400)
RBC: 4.28 MIL/uL (ref 3.87–5.11)
RDW: 16 % — ABNORMAL HIGH (ref 11.5–15.5)
WBC: 6.4 10*3/uL (ref 4.0–10.5)
nRBC: 0 % (ref 0.0–0.2)

## 2018-09-28 LAB — SARS CORONAVIRUS 2 BY RT PCR (HOSPITAL ORDER, PERFORMED IN ~~LOC~~ HOSPITAL LAB): SARS Coronavirus 2: NEGATIVE

## 2018-09-28 LAB — TSH: TSH: 5.923 u[IU]/mL — ABNORMAL HIGH (ref 0.350–4.500)

## 2018-09-28 LAB — I-STAT BETA HCG BLOOD, ED (MC, WL, AP ONLY): I-stat hCG, quantitative: <5 m[IU]/mL (ref <5)

## 2018-09-28 LAB — PROTIME-INR
INR: 1 (ref 0.8–1.2)
Prothrombin Time: 12.7 seconds (ref 11.4–15.2)

## 2018-09-28 LAB — T4, FREE: Free T4: 0.84 ng/dL (ref 0.61–1.12)

## 2018-09-28 MED ORDER — BENZONATATE 100 MG PO CAPS: 100.0000 mg | ORAL_CAPSULE | Freq: Three times a day (TID) | ORAL | 0 refills | Status: AC | PRN

## 2018-09-28 MED ORDER — ACETAMINOPHEN 500 MG PO TABS
1000.0000 mg | ORAL_TABLET | Freq: Once | ORAL | Status: AC
  Administered 2018-09-28: 1000 mg via ORAL
  Filled 2018-09-28: qty 2

## 2018-09-28 MED ORDER — ONDANSETRON 4 MG PO TBDP
4.0000 mg | ORAL_TABLET | Freq: Once | ORAL | Status: AC
  Administered 2018-09-28: 4 mg via ORAL
  Filled 2018-09-28: qty 1

## 2018-09-28 MED ORDER — LACTATED RINGERS IV BOLUS
1000.0000 mL | Freq: Once | INTRAVENOUS | Status: AC
  Administered 2018-09-28: 1000 mL via INTRAVENOUS

## 2018-09-28 MED ORDER — LIDOCAINE VISCOUS HCL 2 % MT SOLN
15.0000 mL | Freq: Once | OROMUCOSAL | Status: AC
  Administered 2018-09-28: 15 mL via OROMUCOSAL
  Filled 2018-09-28: qty 15

## 2018-09-28 MED ORDER — ONDANSETRON HCL 4 MG PO TABS: 4.0000 mg | ORAL_TABLET | Freq: Four times a day (QID) | ORAL | 0 refills | Status: DC

## 2018-09-28 NOTE — ED Triage Notes (Signed)
[facility] reports pt husband positive 2 weeks ago, She has had NV x 1week and cough/sob x4 days.

## 2018-09-28 NOTE — ED Provider Notes (Signed)
[facility] EMERGENCY DEPARTMENT Provider Note   CSN: [id number] Arrival date & time: 09/28/18  1528    History   Chief Complaint Chief Complaint  Patient presents with  . Nausea  . Emesis  . covid    HPI Briana Fuentes is a 43 y.o. female.     HPI Patient presents via EMS for evaluation of multiple complaints.  She reports that over the last week she has been general malaise, fevers, chills, dry nonproductive cough.  Over the last 3 days she has also had nausea and vomiting.  Reports that she vomited too many times to count last night.  Twice again this morning.  Is also had multiple episodes of diarrhea each day.  Concerned that she is dehydrated.  Also reports constant achy headache over the last day.  Past Medical History:  Diagnosis Date  . Diabetes mellitus without complication (HCC)    Gestational diabetes  . Gout   . Hypothyroidism   . Infertility, female   . Neuropathy     Patient Active Problem List   Diagnosis Date Noted  . Graves disease 02/09/2018  . Chronic pain of toe of left foot 04/25/2017  . Hyperthyroidism 12/22/2015  . Dry eye 12/18/2013  . 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 Posterior subcapsular cataract 12/18/2013  . AMA (advanced maternal age) multigravida 35+ 05/01/2012  . DM w/o complication type II ([place]) 05/01/2012  . Diabetes mellitus, antepartum(648.03) 04/19/2012  . Hypothyroidism, postsurgical 04/21/2006    Past Surgical History:  Procedure Laterality Date  . CESAREAN SECTION N/A 08/10/2012   Procedure: CESAREAN SECTION;  Surgeon: Mora Bellman, MD;  Location: [facility];  Service: Obstetrics;  Laterality: N/A;  Primary Cesarean Section Delivery Baby Girl @ 2126, Apgars 9/9  . THYROIDECTOMY       OB History    Gravida  4   Para  3   Term  1   Preterm  2   AB  0   Living  3     SAB  0   TAB  0   Ectopic  0   Multiple  0   Live Births   3            Home Medications    Prior to Admission medications   Medication Sig Start Date End Date Taking? Authorizing Provider  amoxicillin (AMOXIL) 875 MG tablet Take 1 tablet (875 mg total) by mouth 2 (two) times daily. 06/04/18   Raylene Everts, MD  benzonatate (TESSALON PERLES) 100 MG capsule Take 1 capsule (100 mg total) by mouth 3 (three) times daily as needed for up to 7 days for cough. 09/28/18 10/05/18  Tillie Fantasia, MD  Cetirizine HCl 10 MG CAPS Take 1 capsule (10 mg total) by mouth daily for 10 days. 06/14/18 06/24/18  Wieters, Hallie C, PA-C  fluticasone (FLONASE) 50 MCG/ACT nasal spray Place 1-2 sprays into both nostrils daily for 7 days. 06/14/18 06/21/18  Wieters, Hallie C, PA-C  insulin aspart (NOVOLOG) 100 UNIT/ML injection Inject 10 Units into the skin daily.    [provider]  LEVEMIR FLEXTOUCH 100 UNIT/ML Pen  11/03/15   [provider]  levothyroxine (SYNTHROID, LEVOTHROID) 150 MCG tablet Take 150 mcg by mouth daily before breakfast.    [provider]  metFORMIN (GLUCOPHAGE-XR) 500 MG 24 hr tablet TAKE 1 TABLET BY MOUTH TWICE A DAY WITH MEALS 12/31/12   Constant, Vickii Chafe, MD  ondansetron [facility])  4 MG tablet Take 1 tablet (4 mg total) by mouth every 6 (six) hours. 09/28/18   Tillie Fantasia, MD  pregabalin (LYRICA) 100 MG capsule Take 150 mg by mouth 2 (two) times daily.     [provider]  triamcinolone (NASACORT) 55 MCG/ACT AERO nasal inhaler Place 2 sprays into the nose daily. 06/04/18   Raylene Everts, MD    Family History Family History  Problem Relation Age of Onset  . Heart disease Father   . Stroke Father   . Diabetes Mellitus II Father   . Kidney failure Father        on dialysis  . Healthy Mother   . Healthy Brother   . Healthy Sister   . Healthy Daughter   . Healthy Son     Social History Social History   Tobacco Use  . Smoking status: Never Smoker  . Smokeless tobacco: Never Used  Substance Use Topics   . Alcohol use: No  . Drug use: No     Allergies   Patient has no known allergies.   Review of Systems Review of Systems  Constitutional: Positive for chills, fatigue and fever.  HENT: Negative for trouble swallowing.   Eyes: Negative for visual disturbance.  Respiratory: Positive for cough.   Gastrointestinal: Positive for diarrhea, nausea and vomiting.  Endocrine: Negative for polyuria.  Genitourinary:       Reports she is on her period.  Normally is 5 days but has been on it now for 11.  Musculoskeletal: Positive for myalgias.  Skin: Negative for rash.  Neurological: Positive for headaches. Negative for syncope.  Psychiatric/Behavioral: Negative for self-injury.     Physical Exam Updated Vital Signs BP 126/74   Pulse 94   Temp (!) 103 F (39.4 C) (Oral)   Resp (!) 21   SpO2 97%   Physical Exam Vitals signs and nursing note reviewed.  Constitutional:      General: She is not in acute distress.    Appearance: She is well-developed. She is ill-appearing. She is not toxic-appearing.  HENT:     Head: Normocephalic and atraumatic.     Mouth/Throat:     Mouth: Mucous membranes are dry.  Eyes:     Conjunctiva/sclera: Conjunctivae normal.  Neck:     Musculoskeletal: Neck supple.  Cardiovascular:     Rate and Rhythm: Regular rhythm. Tachycardia present.     Heart sounds: No murmur.  Pulmonary:     Effort: Pulmonary effort is normal. No respiratory distress.     Comments: Coarse bilateral breath sounds Abdominal:     Palpations: Abdomen is soft.     Tenderness: There is no abdominal tenderness.  Skin:    General: Skin is warm and dry.  Neurological:     Mental Status: She is alert and oriented to person, place, and time.  Psychiatric:        Mood and Affect: Mood normal.      ED Treatments / Results  Labs (all labs ordered are listed, but only abnormal results are displayed) Labs Reviewed  COMPREHENSIVE METABOLIC PANEL - Abnormal; Notable for the  following components:      Result Value   Sodium 130 (*)    Chloride 94 (*)    Glucose, Bld 179 (*)    BUN <5 (*)    Calcium 8.4 (*)    Albumin 3.3 (*)    All other components within normal limits  CBC WITH DIFFERENTIAL/PLATELET - Abnormal; Notable for the following  components:   Hemoglobin 10.8 (*)    HCT 33.7 (*)    MCV 78.7 (*)    MCH 25.2 (*)    RDW 16.0 (*)    All other components within normal limits  TSH - Abnormal; Notable for the following components:   TSH 5.923 (*)    All other components within normal limits  SARS CORONAVIRUS 2 (HOSPITAL ORDER, [facility])  PROTIME-INR  T4, FREE  I-STAT BETA HCG BLOOD, ED ([facility], [facility], [facility] ONLY)    EKG EKG Interpretation  Date/Time:  Thursday September 28 2018 15:40:08 EDT Ventricular Rate:  102 PR Interval:    QRS Duration: 83 QT Interval:  340 QTC Calculation: 443 R Axis:   -93 Text Interpretation:  Age not entered, assumed to be  43 years old for purpose of ECG interpretation Sinus tachycardia Left anterior fascicular block Consider right ventricular hypertrophy Borderline T abnormalities, anterior leads No significant change since last tracing Confirmed by Deno Etienne [phone]) on 09/28/2018 3:53:08 PM   Radiology Dg Chest Port 1 View  Result Date: 09/28/2018 CLINICAL DATA:  Concern for covid EXAM: PORTABLE CHEST 1 VIEW COMPARISON:  None. FINDINGS: There is patchy airspace opacity seen probably within the right lower lung. There is blunting of the right costophrenic angle which could be due to trace effusion. The cardiomediastinal silhouette is unremarkable. No acute osseous abnormality. IMPRESSION: Airspace opacity probably within the right lower lung. The findings in the lungs are nonspecific, but concerning for atypical infection, which includes viral pneumonia. Electronically Signed   By: Prudencio Pair M.D.   On: 09/28/2018 16:44    Procedures Procedures (including critical care time)  Medications Ordered  in ED Medications  lactated ringers bolus 1,000 mL (0 mLs Intravenous Stopped 09/28/18 1845)  acetaminophen (TYLENOL) tablet 1,000 mg (1,000 mg Oral Given 09/28/18 1622)  ondansetron (ZOFRAN-ODT) disintegrating tablet 4 mg (4 mg Oral Given 09/28/18 1615)  lidocaine (XYLOCAINE) 2 % viscous mouth solution 15 mL (15 mLs Mouth/Throat Given 09/28/18 1843)     Initial Impression / Assessment and Plan / ED Course  I have reviewed the triage vital signs and the nursing notes.  Pertinent labs & imaging results that were available during my care of the patient were reviewed by me and considered in my medical decision making (see chart for details).       Briana Fuentes is a 43 year old female with a history of type 2 diabetes insulin-dependent, Graves' disease, and thyroid associated ophthalmopathy.  I have reviewed her medical record.  She presents today with multiple complaints, all consistent with COVID-19 infection.  Her husband recently tested positive as well.  She was noted to be tachycardic on arrival and febrile 103.  She was treated with Tylenol and fluids.  ECG showed sinus tachycardia with a rate of 102.  No arrhythmia or abnormal intervals.  She does not have new oxygen requirement, however given her comorbidities labs were checked to assess for AKI secondary to dehydration, thyrotoxicosis in the setting of known thyroid disease with tachycardia and constitutional symptoms, and anemia/coagulopathy given that she has had 11 days of menses.  Also chest x-ray to assess for bacterial pneumonia.  Ultimately labs are essentially unremarkable.  She has mild hyponatremia at 130 with hypochloremia at 94.  Likely due to dehydration.  Redemonstrated microcytic anemia with a hemoglobin of 10.8.  Elevated TSH but normal free T4.  No significant leukocytosis.  Overall picture and work-up are most consistent with coronavirus infection given  her history and constellation of symptoms.  Her chest is actually negative, but  given that her husband was positive and her symptoms as above, I think this is potentially a false negative.  Instructed her to continue standard precautions.  Gave Tessalon Perles for cough.  She is otherwise well-appearing and there is no indication for further emergency treatment or admission.  Appropriate for discharge and outpatient follow-up.  Briana Fuentes was evaluated in Emergency Department on 09/29/2018 for the symptoms described in the history of present illness. She was evaluated in the context of the global COVID-19 pandemic, which necessitated consideration that the patient might be at risk for infection with the SARS-CoV-2 virus that causes COVID-19. Institutional protocols and algorithms that pertain to the evaluation of patients at risk for COVID-19 are in a state of rapid change based on information released by regulatory bodies including the CDC and federal and state organizations. These policies and algorithms were followed during the patient's care in the ED.   Final Clinical Impressions(s) / ED Diagnoses   Final diagnoses:  COVID-19 virus infection  Dehydration  Nausea  Non-intractable vomiting with nausea, unspecified vomiting type    ED Discharge Orders         Ordered    ondansetron (ZOFRAN) 4 MG tablet  Every 6 hours     09/28/18 2000    benzonatate (TESSALON PERLES) 100 MG capsule  3 times daily PRN     09/28/18 Baruch Goldmann, MD 09/29/18 [place], [place], DO 09/29/18 1850

## 2018-09-28 NOTE — ED Notes (Signed)
Pt is asking for pain med and water.

## 2018-09-28 NOTE — ED Notes (Signed)
Patient verbalizes understanding of discharge instructions. Opportunity for questioning and answers were provided. Armband removed by staff, pt discharged from ED.  

## 2018-10-05 ENCOUNTER — Telehealth (HOSPITAL_COMMUNITY): Payer: Self-pay

## 2018-10-09 DIAGNOSIS — R1110 Vomiting, unspecified: Secondary | ICD-10-CM | POA: Diagnosis not present

## 2018-11-21 DIAGNOSIS — E1140 Type 2 diabetes mellitus with diabetic neuropathy, unspecified: Secondary | ICD-10-CM | POA: Diagnosis not present

## 2018-11-21 DIAGNOSIS — E790 Hyperuricemia without signs of inflammatory arthritis and tophaceous disease: Secondary | ICD-10-CM | POA: Diagnosis not present

## 2019-01-24 ENCOUNTER — Other Ambulatory Visit: Payer: Self-pay | Admitting: Podiatry

## 2019-01-24 ENCOUNTER — Ambulatory Visit: Payer: BLUE CROSS/BLUE SHIELD | Admitting: Podiatry

## 2019-01-24 DIAGNOSIS — D3610 Benign neoplasm of peripheral nerves and autonomic nervous system, unspecified: Secondary | ICD-10-CM

## 2019-11-22 ENCOUNTER — Encounter: Payer: Self-pay | Admitting: Plastic Surgery

## 2019-11-22 ENCOUNTER — Other Ambulatory Visit: Payer: Self-pay

## 2019-11-22 ENCOUNTER — Ambulatory Visit (INDEPENDENT_AMBULATORY_CARE_PROVIDER_SITE_OTHER): Payer: Self-pay | Admitting: Plastic Surgery

## 2019-11-22 VITALS — HR 79 | Temp 98.4°F | Ht 65.0 in | Wt 141.2 lb

## 2019-11-22 DIAGNOSIS — Z411 Encounter for cosmetic surgery: Secondary | ICD-10-CM

## 2019-11-22 NOTE — Progress Notes (Signed)
Patient presents for cosmetic visit.  She is bothered by the scars on her glabella and upper forehead.  She is bothered by the dynamic lines in her glabella.  She is bothered by her prominent nasolabial folds.  She is also bothered by some more subtle pigment irregularities in her cheeks and above her eyebrows that she has been told her melasma.  On examination she has dynamic but not static lines in the glabella.  She has a glabellar scar and an upper forehead scar with subtle contour irregularities.  She has fairly prominent nasolabial folds and mild pigment irregularities around the periorbital area.  I recommended 15 units of Botox and 1 cc of Restylane define into the nasolabial fold.  Her face was prepped with an alcohol pad.  15 of both units of Botox were distributed into the glabella.  Restylane define was then injected along the nasolabial folds taking care to keep the needle moving so as to avoid intravascular injection.  She tolerated this well and was happy with the on table result.  I will follow up with her about dermabrasion to the scars on her forehead as I think this would benefit her.  Additionally I have asked Phoebe Sharps to discuss a skin care regimen with her that would be appropriate.  I will follow up with her regarding the dermabrasion.

## 2019-11-25 ENCOUNTER — Encounter: Payer: Self-pay | Admitting: Plastic Surgery

## 2019-11-25 NOTE — Progress Notes (Signed)
Subjective:    Patient ID: Briana Fuentes, female    DOB: 14-Jun-1975, 44 y.o.   MRN: [id number]  Chief Complaint  Patient presents with  . Skin Problem    HPI Patient is a 44 year old female who presents today for skin care consultation.  Her primary concern is some hyperpigmentation that appears superior to her eyebrows.  Her current skin care routine consists of [place] cleanser daily, apricot scrub approximately 1 time per week, and Nivea moisturizer. She does not use regular sunscreen. She reports she takes vitamin C but does not apply a topical vitamin C cream.  She has a few scattered places of mild hyperpigmentation on her cheeks and more significant above bilateral eyebrows.  She has some mild fine lines and wrinkles on the upper forehead and glabella.  Today she received 15 units of Botox distributed into the glabella as well as 1 cc of Restylane along the nasolabial folds.  Current plan is to receive dermabrasion to the scars on her forehead with Dr. Claudia Desanctis.  Review of Systems  Constitutional: Negative for chills and fever.  HENT: Negative for congestion, rhinorrhea and sore throat.   Respiratory: Negative for cough.   Skin: Positive for color change (hyperpigmentation on cheeks and above eyebrows).       Scars on forehead and glabella area       Objective:    Physical Exam Constitutional:      General: She is not in acute distress.    Appearance: Normal appearance. She is normal weight. She is not ill-appearing.  HENT:     Head: Normocephalic and atraumatic.     Comments: Mild spots of hyperpigmentation on cheeks and more significant hyperpigmentation superior to bilateral eyebrows. Mild fine lines/wrinkles.  Eyes:     Extraocular Movements: Extraocular movements intact.  Cardiovascular:     Pulses: Normal pulses.  Pulmonary:     Effort: Pulmonary effort is normal.  Neurological:     Mental Status: She is alert.       Assessment & Plan:   Briana Fuentes has mild signs  of aging with good overall skin texture. Hyperpigmentation on her cheeks and above her eyebrows is her biggest concern at this time from a skin care product perspective. Her current regimen of cleanser and weekly exfoliation is not sufficient to target the hyperpigmentation. She also must begin to utilize daily sunscreen to help prevent worsening and additional hyperpigmentation.  Recommend that she begin using the ZO Skin Brightening Program Kit and daily sunscreen. The kit contains the following products: 1. Gentle Cleanser - Use AM & PM 2. Exfoliating Polish - Use AM or PM at least 3-4 times a week, working up to daily. 3. Complexion Renewal Pads - Am & PM 4. Daily Power Defense - AM or PM 1-2 pumps 5. Brightalive Skin Brightner - AM & PM 1-2 pumps  She also needs to utilize a daily sunscreen of SPF 30 or greater. After using these products 4-6 weeks, if we have not seen enough improvement of the hyperpigmentation we can add a Vitamin C serum and/or Retinol.    Products purchased today: ZO Skin Brightening Program Kit (ordered) and Elta MD Daily Tinted SPF 75 Sunscreen.  Follow up in 4-6 weeks. Call office with any questions/concerns.   The [place] was signed into law in 2016 which includes the topic of electronic health records.  This provides immediate access to information in MyChart.  This includes consultation notes, operative notes, office notes,  lab results and pathology reports.  If you have any questions about what you read please let us know at your next visit or call us at the office.  We are right here with you.    Threasa Heads, PA-C

## 2019-12-30 NOTE — Progress Notes (Deleted)
Patient is a 43 year old female here for follow-up on skin care.  Her primary concern is some hyperpigmentation that appears superior to her eyebrows.  She also has a few scattered places of mild hyperpigmentation on her cheeks.  She has some mild fine lines and wrinkles on the forehead and glabella.  At last visit on 9/30 she received 15 units of Botox distributed into the glabella as well as 1 cc of Restylane along the nasolabial folds with Dr. Claudia Desanctis.  She also purchased ZO Skin Brightening Program kit and Elta MD daily tinted SPF 40 sunscreen.  ZO Skin Brightening Kit includes: 1. Gentle Cleanser - Use AM & PM 2. Exfoliating Polish - Use AM or PM at least 3-4 times a week, working up to daily. 3. Complexion Renewal Pads - Am & PM 4. Daily Power Defense - AM or PM 1-2 pumps 5. Brightalive Skin Brightner - AM & PM 1-2 pumps    If not enough improvement of the hyperpigmentation we can add vitamin C serum and/or retinol. (wrinkle/texture repair or 0.5% retinol)

## 2020-01-02 ENCOUNTER — Encounter: Payer: 59 | Admitting: Plastic Surgery

## 2020-01-02 ENCOUNTER — Ambulatory Visit: Payer: 59 | Admitting: Plastic Surgery

## 2020-01-02 DIAGNOSIS — Z411 Encounter for cosmetic surgery: Secondary | ICD-10-CM

## 2020-03-23 ENCOUNTER — Other Ambulatory Visit: Payer: Self-pay | Admitting: Podiatry

## 2020-03-23 DIAGNOSIS — D3610 Benign neoplasm of peripheral nerves and autonomic nervous system, unspecified: Secondary | ICD-10-CM

## 2020-04-15 ENCOUNTER — Telehealth: Payer: Self-pay | Admitting: Internal Medicine

## 2020-04-15 NOTE — Telephone Encounter (Signed)
Received a new hem referral from Dr. Chalmers Cater for anemia. Briana Fuentes returned my call and has been scheduled to see Dr. Julien Nordmann on 3/1 at 11:45am w/labs at 11:15am. Pt aware to arrive 30 minutes early.

## 2020-04-22 ENCOUNTER — Other Ambulatory Visit: Payer: Self-pay | Admitting: Medical Oncology

## 2020-04-22 ENCOUNTER — Telehealth: Payer: Self-pay

## 2020-04-22 ENCOUNTER — Telehealth: Payer: Self-pay | Admitting: Medical Oncology

## 2020-04-22 ENCOUNTER — Encounter: Payer: Self-pay | Admitting: Internal Medicine

## 2020-04-22 ENCOUNTER — Other Ambulatory Visit: Payer: Self-pay

## 2020-04-22 ENCOUNTER — Inpatient Hospital Stay: Payer: 59

## 2020-04-22 ENCOUNTER — Other Ambulatory Visit: Payer: Self-pay | Admitting: Internal Medicine

## 2020-04-22 ENCOUNTER — Inpatient Hospital Stay: Payer: 59 | Attending: Internal Medicine | Admitting: Internal Medicine

## 2020-04-22 VITALS — BP 131/81 | HR 73 | Temp 97.6°F | Resp 16 | Ht 65.0 in | Wt 144.3 lb

## 2020-04-22 DIAGNOSIS — N920 Excessive and frequent menstruation with regular cycle: Secondary | ICD-10-CM | POA: Diagnosis not present

## 2020-04-22 DIAGNOSIS — D500 Iron deficiency anemia secondary to blood loss (chronic): Secondary | ICD-10-CM | POA: Diagnosis not present

## 2020-04-22 DIAGNOSIS — E039 Hypothyroidism, unspecified: Secondary | ICD-10-CM

## 2020-04-22 DIAGNOSIS — Z7984 Long term (current) use of oral hypoglycemic drugs: Secondary | ICD-10-CM

## 2020-04-22 DIAGNOSIS — E119 Type 2 diabetes mellitus without complications: Secondary | ICD-10-CM

## 2020-04-22 DIAGNOSIS — Z79899 Other long term (current) drug therapy: Secondary | ICD-10-CM

## 2020-04-22 DIAGNOSIS — D539 Nutritional anemia, unspecified: Secondary | ICD-10-CM

## 2020-04-22 DIAGNOSIS — G629 Polyneuropathy, unspecified: Secondary | ICD-10-CM

## 2020-04-22 DIAGNOSIS — E890 Postprocedural hypothyroidism: Secondary | ICD-10-CM

## 2020-04-22 DIAGNOSIS — N939 Abnormal uterine and vaginal bleeding, unspecified: Secondary | ICD-10-CM | POA: Insufficient documentation

## 2020-04-22 LAB — CBC WITH DIFFERENTIAL (CANCER CENTER ONLY)
Abs Immature Granulocytes: 0.02 10*3/uL (ref 0.00–0.07)
Basophils Absolute: 0.1 10*3/uL (ref 0.0–0.1)
Basophils Relative: 1 %
Eosinophils Absolute: 0.2 10*3/uL (ref 0.0–0.5)
Eosinophils Relative: 2 %
HCT: 26.3 % — ABNORMAL LOW (ref 36.0–46.0)
Hemoglobin: 6.9 g/dL — CL (ref 12.0–15.0)
Immature Granulocytes: 0 %
Lymphocytes Relative: 29 %
Lymphs Abs: 2.1 10*3/uL (ref 0.7–4.0)
MCH: 17.7 pg — ABNORMAL LOW (ref 26.0–34.0)
MCHC: 26.2 g/dL — ABNORMAL LOW (ref 30.0–36.0)
MCV: 67.6 fL — ABNORMAL LOW (ref 80.0–100.0)
Monocytes Absolute: 0.5 10*3/uL (ref 0.1–1.0)
Monocytes Relative: 6 %
Neutro Abs: 4.4 10*3/uL (ref 1.7–7.7)
Neutrophils Relative %: 62 %
Platelet Count: 457 10*3/uL — ABNORMAL HIGH (ref 150–400)
RBC: 3.89 MIL/uL (ref 3.87–5.11)
RDW: 23.9 % — ABNORMAL HIGH (ref 11.5–15.5)
WBC Count: 7.2 10*3/uL (ref 4.0–10.5)
nRBC: 0 % (ref 0.0–0.2)

## 2020-04-22 LAB — IRON AND TIBC
Iron: 15 ug/dL — ABNORMAL LOW (ref 41–142)
Saturation Ratios: 3 % — ABNORMAL LOW (ref 21–57)
TIBC: 490 ug/dL — ABNORMAL HIGH (ref 236–444)
UIBC: 475 ug/dL — ABNORMAL HIGH (ref 120–384)

## 2020-04-22 LAB — CMP (CANCER CENTER ONLY)
ALT: 10 U/L (ref 0–44)
AST: 10 U/L — ABNORMAL LOW (ref 15–41)
Albumin: 3.8 g/dL (ref 3.5–5.0)
Alkaline Phosphatase: 55 U/L (ref 38–126)
Anion gap: 11 (ref 5–15)
BUN: 8 mg/dL (ref 6–20)
CO2: 23 mmol/L (ref 22–32)
Calcium: 8.9 mg/dL (ref 8.9–10.3)
Chloride: 103 mmol/L (ref 98–111)
Creatinine: 0.71 mg/dL (ref 0.44–1.00)
GFR, Estimated: >60 mL/min (ref >60)
Glucose, Bld: 135 mg/dL — ABNORMAL HIGH (ref 70–99)
Potassium: 3.9 mmol/L (ref 3.5–5.1)
Sodium: 137 mmol/L (ref 135–145)
Total Bilirubin: 0.3 mg/dL (ref 0.3–1.2)
Total Protein: 8 g/dL (ref 6.5–8.1)

## 2020-04-22 LAB — RETICULOCYTES
Immature Retic Fract: 25.3 % — ABNORMAL HIGH (ref 2.3–15.9)
RBC.: 3.96 MIL/uL (ref 3.87–5.11)
Retic Count, Absolute: 94.2 10*3/uL (ref 19.0–186.0)
Retic Ct Pct: 2.4 % (ref 0.4–3.1)

## 2020-04-22 LAB — LACTATE DEHYDROGENASE: LDH: 104 U/L (ref 98–192)

## 2020-04-22 LAB — VITAMIN B12: Vitamin B-12: 397 pg/mL (ref 180–914)

## 2020-04-22 LAB — FERRITIN: Ferritin: 4 ng/mL — ABNORMAL LOW (ref 11–307)

## 2020-04-22 LAB — FOLATE: Folate: 6.3 ng/mL (ref >5.9)

## 2020-04-22 MED ORDER — INTEGRA PLUS PO CAPS: 1.0000 | ORAL_CAPSULE | Freq: Every day | ORAL | 3 refills | Status: DC

## 2020-04-22 NOTE — Progress Notes (Signed)
[facility] [phone]   [phone]  CONSULT NOTE  REFERRING PHYSICIAN: Dr. Jacelyn Pi  REASON FOR CONSULTATION:  45 years old female with severe anemia  HPI Briana Fuentes is a 45 y.o. female with past medical history significant for diabetes mellitus and currently on Metformin, hypothyroidism on levothyroxine as well as long history of anemia.  The patient was seen recently by her primary care physician for routine evaluation and repeat CBC on April 03, 2020 showed significant anemia with hemoglobin down to 6.2 and hematocrit 23.7 with MCV of 63.4.  She had ferritin level of 2.  She was asked by her primary care physician to start taking over-the-counter ferrous sulfate 1 tablet daily.  The patient does not eat a lot of red meat because she has a concern about gout and worsening of her peripheral neuropathy.  She mentioned that she had anemia and bleeding for several months after the C-section for her last child.  Her current menstrual period last around 6 days but usually heavy with some clots.  She denied having any other bleeding, bruises or ecchymosis.  She has occasional dizziness but she attributed it to her treatment with Lyrica.  She has craving to there and raw rise.  She denied having any shortness of breath or chest pain.  She denied having any recent weight loss or night sweats.  She has no nausea, vomiting, diarrhea or constipation.  She was referred to me today for evaluation and consideration of treatment of her anemia. Family history significant for mother and father with diabetes mellitus. The patient is married and has 3 children ages 11, 80 and 14.  She is a housewife.  She has no history of smoking, alcohol or drug abuse.  HPI  Past Medical History:  Diagnosis Date  . Diabetes mellitus without complication (HCC)    Gestational diabetes  . Gout   . Hypothyroidism   . Infertility, female   . Neuropathy     Past Surgical  History:  Procedure Laterality Date  . CESAREAN SECTION N/A 08/10/2012   Procedure: CESAREAN SECTION;  Surgeon: Mora Bellman, MD;  Location: [facility];  Service: Obstetrics;  Laterality: N/A;  Primary Cesarean Section Delivery Baby Girl @ 2126, Apgars 9/9  . THYROIDECTOMY      Family History  Problem Relation Age of Onset  . Heart disease Father   . Stroke Father   . Diabetes Mellitus II Father   . Kidney failure Father        on dialysis  . Healthy Mother   . Healthy Brother   . Healthy Sister   . Healthy Daughter   . Healthy Son     Social History Social History   Tobacco Use  . Smoking status: Never Smoker  . Smokeless tobacco: Never Used  Vaping Use  . Vaping Use: Never used  Substance Use Topics  . Alcohol use: No  . Drug use: No    No Known Allergies  Current Outpatient Medications  Medication Sig Dispense Refill  . metFORMIN (GLUCOPHAGE-XR) 500 MG 24 hr tablet TAKE 1 TABLET BY MOUTH TWICE A DAY WITH MEALS 60 tablet 0  . pregabalin (LYRICA) 100 MG capsule Take 150 mg by mouth 2 (two) times daily.     [name] levothyroxine (SYNTHROID) 125 MCG tablet levothyroxine 125 mcg tablet  TAKE 1 TABLET BY MOUTH EVERY MORNING ON AN EMPTY STOMACH     No current facility-administered medications for this visit.  Review of Systems  Constitutional: positive for fatigue Eyes: negative Ears, nose, mouth, throat, and face: negative Respiratory: negative Cardiovascular: negative Gastrointestinal: positive for constipation Genitourinary:negative Integument/breast: negative Hematologic/lymphatic: negative Musculoskeletal:negative Neurological: negative Behavioral/Psych: negative Endocrine: negative Allergic/Immunologic: negative  Physical Exam  TAV:WPVXY, healthy, no distress, well nourished, well developed and pale SKIN: skin color, texture, turgor are normal, no rashes or significant lesions HEAD: Normocephalic, No masses, lesions, tenderness or abnormalities EYES:  normal, PERRLA, Conjunctiva are pink and non-injected EARS: External ears normal, Canals clear OROPHARYNX:no exudate, no erythema and lips, buccal mucosa, and tongue normal  NECK: supple, no adenopathy, no JVD LYMPH:  no palpable lymphadenopathy, no hepatosplenomegaly BREAST:not examined LUNGS: clear to auscultation , and palpation HEART: regular rate & rhythm, no murmurs and no gallops ABDOMEN:abdomen soft, non-tender, normal bowel sounds and no masses or organomegaly BACK: No CVA tenderness, Range of motion is normal EXTREMITIES:no joint deformities, effusion, or inflammation, no edema  NEURO: alert & oriented x 3 with fluent speech, no focal motor/sensory deficits  PERFORMANCE STATUS: ECOG 1  LABORATORY DATA: Lab Results  Component Value Date   WBC 7.2 04/22/2020   HGB 6.9 (LL) 04/22/2020   HCT 26.3 (L) 04/22/2020   MCV 67.6 (L) 04/22/2020   PLT 457 (H) 04/22/2020      Chemistry      Component Value Date/Time   NA 137 04/22/2020 1037   K 3.9 04/22/2020 1037   CL 103 04/22/2020 1037   CO2 23 04/22/2020 1037   BUN 8 04/22/2020 1037   CREATININE 0.71 04/22/2020 1037   CREATININE 0.51 05/01/2012 1104      Component Value Date/Time   CALCIUM 8.9 04/22/2020 1037   ALKPHOS 55 04/22/2020 1037   AST 10 (L) 04/22/2020 1037   ALT 10 04/22/2020 1037   BILITOT 0.3 04/22/2020 1037       RADIOGRAPHIC STUDIES: No results found.  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   PLAN: I had a lengthy discussion with the patient today about her current condition and treatment options.  Clearly from the recent blood work she has severe iron deficiency secondary to blood loss which could be secondary to gynecologic blood loss but also insufficient intake. I checked several studies on this patient today including repeat CBC which showed hemoglobin of 6.9 and hematocrit 26.3% with MCV of 67.6.   Serum iron was 15 with total iron binding capacity of 490 and iron saturation 3%.  Ferritin level was 4. Serum folate and vitamin B12 level were normal. I will also check her stool for Hemoccult. She is currently mildly symptomatic from her anemia and I did not see an urgent need to give her blood transfusion today. I recommended for the patient consider treatment with iron infusion with Feraheme 510 mg IV weekly for 2 doses.  I also start the patient on Integra +1 capsule p.o. daily. I will see her back for follow-up visit in around 6 weeks for evaluation and repeat CBC, iron study and ferritin. The patient was advised to call immediately if she has any other concerning symptoms in the interval. The patient voices understanding of current disease status and treatment options and is in agreement with the current care plan.  All questions were answered. The patient knows to call the clinic with any problems, questions or concerns. We can certainly see the patient much sooner if necessary.  Thank you so much for allowing me  to participate in the care of Briana Fuentes. I will continue to follow up the patient with you and assist in her care.  The total time spent in the appointment was 60 minutes.  Disclaimer: This note was dictated with voice recognition software. Similar sounding words can inadvertently be transcribed and may not be corrected upon review.   Eilleen Kempf April 22, 2020, 11:46 AM

## 2020-04-22 NOTE — Telephone Encounter (Signed)
CRITICAL VALUE STICKER  CRITICAL VALUE: Hgb = 6.9  RECEIVER (on-site recipient of call): Yetta Glassman, [place] NOTIFIED: 04/22/20 at 11:02am  MESSENGER (representative from lab): Lonnie  MD NOTIFIED: Dr. Julien Nordmann  TIME OF NOTIFICATION:  04/22/20 at 11:05am  RESPONSE: Notification given to Shauna Hugh, RN for follow-up with provider.

## 2020-04-22 NOTE — Patient Instructions (Signed)
Ferumoxytol injection What is this medicine? FERUMOXYTOL is an iron complex. Iron is used to make healthy red blood cells, which carry oxygen and nutrients throughout the body. This medicine is used to treat iron deficiency anemia. This medicine may be used for other purposes; ask your health care provider or pharmacist if you have questions. COMMON BRAND NAME(S): Feraheme What should I tell my health care provider before I take this medicine? They need to know if you have any of these conditions:  anemia not caused by low iron levels  high levels of iron in the blood  magnetic resonance imaging (MRI) test scheduled  an unusual or allergic reaction to iron, other medicines, foods, dyes, or preservatives  pregnant or trying to get pregnant  breast-feeding How should I use this medicine? This medicine is for injection into a vein. It is given by a health care professional in a hospital or clinic setting. Talk to your pediatrician regarding the use of this medicine in children. Special care may be needed. Overdosage: If you think you have taken too much of this medicine contact a poison control center or emergency room at once. NOTE: This medicine is only for you. Do not share this medicine with others. What if I miss a dose? It is important not to miss your dose. Call your doctor or health care professional if you are unable to keep an appointment. What may interact with this medicine? This medicine may interact with the following medications:  other iron products This list may not describe all possible interactions. Give your health care provider a list of all the medicines, herbs, non-prescription drugs, or dietary supplements you use. Also tell them if you smoke, drink alcohol, or use illegal drugs. Some items may interact with your medicine. What should I watch for while using this medicine? Visit your doctor or healthcare professional regularly. Tell your doctor or healthcare  professional if your symptoms do not start to get better or if they get worse. You may need blood work done while you are taking this medicine. You may need to follow a special diet. Talk to your doctor. Foods that contain iron include: whole grains/cereals, dried fruits, beans, or peas, leafy green vegetables, and organ meats (liver, kidney). What side effects may I notice from receiving this medicine? Side effects that you should report to your doctor or health care professional as soon as possible:  allergic reactions like skin rash, itching or hives, swelling of the face, lips, or tongue  breathing problems  changes in blood pressure  feeling faint or lightheaded, falls  fever or chills  flushing, sweating, or hot feelings  swelling of the ankles or feet Side effects that usually do not require medical attention (report to your doctor or health care professional if they continue or are bothersome):  diarrhea  headache  nausea, vomiting  stomach pain This list may not describe all possible side effects. Call your doctor for medical advice about side effects. You may report side effects to FDA at 1-800-FDA-1088. Where should I keep my medicine? This drug is given in a hospital or clinic and will not be stored at home. NOTE: This sheet is a summary. It may not cover all possible information. If you have questions about this medicine, talk to your doctor, pharmacist, or health care provider.  2021 Elsevier/Gold Standard (2016-03-29 20:21:10) Iron Deficiency Anemia, Adult Iron deficiency anemia is when you do not have enough red blood cells or hemoglobin in your blood. This happens  because you have too little iron in your body. Hemoglobin carries oxygen to parts of the body. Anemia can cause your body to not get enough oxygen. What are the causes?  Not eating enough foods that have iron in them.  The body not being able to take in iron well.  Needing more iron due to pregnancy  or heavy menstrual periods, for females.  Cancer.  Bleeding in the bowels.  Many blood draws. What increases the risk?  Being pregnant.  Being a teenage girl going through a growth spurt. What are the signs or symptoms?  Pale skin, lips, and nails.  Weakness, dizziness, and getting tired easily.  Headache.  Feeling like you cannot breathe well when moving (shortness of breath).  Cold hands and feet.  Fast heartbeat or a heartbeat that is not regular.  Feeling grouchy (irritable) or breathing fast. These are more common in very bad anemia. Mild anemia may not cause any symptoms. How is this treated? This condition is treated by finding out why you do not have enough iron and then getting more iron. It may include:  Adding foods to your diet that have a lot of iron.  Taking iron pills (supplements). If you are pregnant or breastfeeding, you may need to take extra iron. Your diet often does not provide the amount of iron that you need.  Getting more vitamin C in your diet. Vitamin C helps your body take in iron. You may need to take iron pills with a glass of orange juice or vitamin C pills.  Medicines to make heavy menstrual periods lighter.  Surgery. You may need blood tests to see if treatment is working. If the treatment does not seem to be working, you may need more tests. Follow these instructions at home: Medicines  Take over-the-counter and prescription medicines only as told by your doctor. This includes iron pills and vitamins. ? Take iron pills when your stomach is empty. If you cannot handle this, take them with food. ? Do not drink milk or take antacids at the same time as your iron pills. ? Iron pills may turn your poop (stool)black.  If you cannot handle taking iron pills by mouth, ask your doctor about getting iron through: ? An IV tube. ? A shot (injection) into a muscle. Eating and drinking  Talk with your doctor before changing the foods you eat.  He or she may tell you to eat foods that have a lot of iron, such as: ? Liver. ? Low-fat (lean) beef. ? Breads and cereals that have iron added to them. ? Eggs. ? Dried fruit. ? Dark green, leafy vegetables.  Eat fresh fruits and vegetables that are high in vitamin C. They help your body use iron. Foods with a lot of vitamin C include: ? Oranges. ? Peppers. ? Tomatoes. ? Mangoes.  Drink enough fluid to keep your pee (urine) pale yellow.   Managing constipation If you are taking iron pills, they may cause trouble pooping (constipation). To prevent or treat trouble pooping, you may need to:  Take over-the-counter or prescription medicines.  Eat foods that are high in fiber. These include beans, whole grains, and fresh fruits and vegetables.  Limit foods that are high in fat and sugar. These include fried or sweet foods. General instructions  Return to your normal activities as told by your doctor. Ask your doctor what activities are safe for you.  Keep yourself clean, and keep things clean around you.  Keep all follow-up  visits as told by your doctor. This is important. Contact a doctor if:  You feel like you may vomit (nauseous), or you vomit.  You feel weak.  You are sweating for no reason.  You have trouble pooping, such as: ? Pooping less than 3 times a week. ? Straining to poop. ? Having poop that is hard, dry, or larger than normal. ? Feeling full or bloated. ? Pain in the lower belly. ? Not feeling better after pooping. Get help right away if:  You pass out (faint).  You have chest pain.  You have trouble breathing that: ? Is very bad. ? Gets worse with physical activity.  You have a fast heartbeat, or a heartbeat that does not feel regular.  You get light-headed when getting up from sitting or lying down. These symptoms may be an emergency. Do not wait to see if the symptoms will go away. Get medical help right away. Call your local emergency services  (911 in the [place] Do not drive yourself to the hospital. Summary  Iron deficiency anemia is when you have too little iron in your body.  This condition is treated by finding out why you do not have enough iron in your body and then getting more iron.  Take over-the-counter and prescription medicines only as told by your doctor.  Eat fresh fruits and vegetables that are high in vitamin C.  Get help right away if you cannot breathe well. This information is not intended to replace advice given to you by your health care provider. Make sure you discuss any questions you have with your health care provider. Document Revised: 10/17/2018 Document Reviewed: 10/17/2018 Elsevier Patient Education  [place].

## 2020-04-22 NOTE — Telephone Encounter (Signed)
Hemoccult II Sensa patient kit  Pt instructed to pick up the kit when she comes for her first iron treatment.I reviewed instructions with her and told her to read directions on the kit. She voiced understanding.

## 2020-04-23 ENCOUNTER — Telehealth: Payer: Self-pay | Admitting: Internal Medicine

## 2020-04-23 NOTE — Telephone Encounter (Signed)
Scheduled per 03/01 los, patient has been called and notified. ?

## 2020-04-25 ENCOUNTER — Inpatient Hospital Stay: Payer: 59

## 2020-04-25 ENCOUNTER — Other Ambulatory Visit: Payer: Self-pay

## 2020-04-25 VITALS — BP 115/69 | HR 79 | Temp 98.2°F | Resp 18

## 2020-04-25 DIAGNOSIS — D500 Iron deficiency anemia secondary to blood loss (chronic): Secondary | ICD-10-CM | POA: Diagnosis not present

## 2020-04-25 MED ORDER — ACETAMINOPHEN 325 MG PO TABS
650.0000 mg | ORAL_TABLET | Freq: Once | ORAL | Status: AC
  Administered 2020-04-25: 650 mg via ORAL

## 2020-04-25 MED ORDER — SODIUM CHLORIDE 0.9 % IV SOLN
510.0000 mg | Freq: Once | INTRAVENOUS | Status: AC
  Administered 2020-04-25: 510 mg via INTRAVENOUS
  Filled 2020-04-25: qty 510

## 2020-04-25 MED ORDER — ACETAMINOPHEN 325 MG PO TABS
ORAL_TABLET | ORAL | Status: AC
  Filled 2020-04-25: qty 2

## 2020-04-25 MED ORDER — DIPHENHYDRAMINE HCL 50 MG/ML IJ SOLN
25.0000 mg | Freq: Once | INTRAMUSCULAR | Status: AC
  Administered 2020-04-25: 25 mg via INTRAVENOUS

## 2020-04-25 MED ORDER — DIPHENHYDRAMINE HCL 50 MG/ML IJ SOLN
INTRAMUSCULAR | Status: AC
  Filled 2020-04-25: qty 1

## 2020-04-25 MED ORDER — SODIUM CHLORIDE 0.9 % IV SOLN
Freq: Once | INTRAVENOUS | Status: AC
  Filled 2020-04-25: qty 250

## 2020-04-25 NOTE — Patient Instructions (Signed)
Ferumoxytol injection What is this medicine? FERUMOXYTOL is an iron complex. Iron is used to make healthy red blood cells, which carry oxygen and nutrients throughout the body. This medicine is used to treat iron deficiency anemia. This medicine may be used for other purposes; ask your health care provider or pharmacist if you have questions. COMMON BRAND NAME(S): Feraheme What should I tell my health care provider before I take this medicine? They need to know if you have any of these conditions:  anemia not caused by low iron levels  high levels of iron in the blood  magnetic resonance imaging (MRI) test scheduled  an unusual or allergic reaction to iron, other medicines, foods, dyes, or preservatives  pregnant or trying to get pregnant  breast-feeding How should I use this medicine? This medicine is for injection into a vein. It is given by a health care professional in a hospital or clinic setting. Talk to your pediatrician regarding the use of this medicine in children. Special care may be needed. Overdosage: If you think you have taken too much of this medicine contact a poison control center or emergency room at once. NOTE: This medicine is only for you. Do not share this medicine with others. What if I miss a dose? It is important not to miss your dose. Call your doctor or health care professional if you are unable to keep an appointment. What may interact with this medicine? This medicine may interact with the following medications:  other iron products This list may not describe all possible interactions. Give your health care provider a list of all the medicines, herbs, non-prescription drugs, or dietary supplements you use. Also tell them if you smoke, drink alcohol, or use illegal drugs. Some items may interact with your medicine. What should I watch for while using this medicine? Visit your doctor or healthcare professional regularly. Tell your doctor or healthcare  professional if your symptoms do not start to get better or if they get worse. You may need blood work done while you are taking this medicine. You may need to follow a special diet. Talk to your doctor. Foods that contain iron include: whole grains/cereals, dried fruits, beans, or peas, leafy green vegetables, and organ meats (liver, kidney). What side effects may I notice from receiving this medicine? Side effects that you should report to your doctor or health care professional as soon as possible:  allergic reactions like skin rash, itching or hives, swelling of the face, lips, or tongue  breathing problems  changes in blood pressure  feeling faint or lightheaded, falls  fever or chills  flushing, sweating, or hot feelings  swelling of the ankles or feet Side effects that usually do not require medical attention (report to your doctor or health care professional if they continue or are bothersome):  diarrhea  headache  nausea, vomiting  stomach pain This list may not describe all possible side effects. Call your doctor for medical advice about side effects. You may report side effects to FDA at 1-800-FDA-1088. Where should I keep my medicine? This drug is given in a hospital or clinic and will not be stored at home. NOTE: This sheet is a summary. It may not cover all possible information. If you have questions about this medicine, talk to your doctor, pharmacist, or health care provider.  2021 Elsevier/Gold Standard (2016-03-29 20:21:10)  

## 2020-05-02 ENCOUNTER — Other Ambulatory Visit: Payer: Self-pay

## 2020-05-02 ENCOUNTER — Inpatient Hospital Stay: Payer: 59

## 2020-05-02 VITALS — BP 107/70 | HR 71 | Temp 97.7°F | Resp 16

## 2020-05-02 DIAGNOSIS — D500 Iron deficiency anemia secondary to blood loss (chronic): Secondary | ICD-10-CM | POA: Diagnosis not present

## 2020-05-02 MED ORDER — DIPHENHYDRAMINE HCL 50 MG/ML IJ SOLN
INTRAMUSCULAR | Status: AC
  Filled 2020-05-02: qty 1

## 2020-05-02 MED ORDER — SODIUM CHLORIDE 0.9 % IV SOLN
510.0000 mg | Freq: Once | INTRAVENOUS | Status: AC
  Administered 2020-05-02: 510 mg via INTRAVENOUS
  Filled 2020-05-02: qty 17

## 2020-05-02 MED ORDER — ACETAMINOPHEN 325 MG PO TABS
650.0000 mg | ORAL_TABLET | Freq: Once | ORAL | Status: AC
  Administered 2020-05-02: 650 mg via ORAL

## 2020-05-02 MED ORDER — SODIUM CHLORIDE 0.9 % IV SOLN
Freq: Once | INTRAVENOUS | Status: AC
  Filled 2020-05-02: qty 250

## 2020-05-02 MED ORDER — DIPHENHYDRAMINE HCL 50 MG/ML IJ SOLN
25.0000 mg | Freq: Once | INTRAMUSCULAR | Status: AC
  Administered 2020-05-02: 25 mg via INTRAVENOUS

## 2020-05-02 MED ORDER — ACETAMINOPHEN 325 MG PO TABS
ORAL_TABLET | ORAL | Status: AC
  Filled 2020-05-02: qty 2

## 2020-05-02 NOTE — Patient Instructions (Signed)
Ferumoxytol injection What is this medicine? FERUMOXYTOL is an iron complex. Iron is used to make healthy red blood cells, which carry oxygen and nutrients throughout the body. This medicine is used to treat iron deficiency anemia. This medicine may be used for other purposes; ask your health care provider or pharmacist if you have questions. COMMON BRAND NAME(S): Feraheme What should I tell my health care provider before I take this medicine? They need to know if you have any of these conditions:  anemia not caused by low iron levels  high levels of iron in the blood  magnetic resonance imaging (MRI) test scheduled  an unusual or allergic reaction to iron, other medicines, foods, dyes, or preservatives  pregnant or trying to get pregnant  breast-feeding How should I use this medicine? This medicine is for injection into a vein. It is given by a health care professional in a hospital or clinic setting. Talk to your pediatrician regarding the use of this medicine in children. Special care may be needed. Overdosage: If you think you have taken too much of this medicine contact a poison control center or emergency room at once. NOTE: This medicine is only for you. Do not share this medicine with others. What if I miss a dose? It is important not to miss your dose. Call your doctor or health care professional if you are unable to keep an appointment. What may interact with this medicine? This medicine may interact with the following medications:  other iron products This list may not describe all possible interactions. Give your health care provider a list of all the medicines, herbs, non-prescription drugs, or dietary supplements you use. Also tell them if you smoke, drink alcohol, or use illegal drugs. Some items may interact with your medicine. What should I watch for while using this medicine? Visit your doctor or healthcare professional regularly. Tell your doctor or healthcare  professional if your symptoms do not start to get better or if they get worse. You may need blood work done while you are taking this medicine. You may need to follow a special diet. Talk to your doctor. Foods that contain iron include: whole grains/cereals, dried fruits, beans, or peas, leafy green vegetables, and organ meats (liver, kidney). What side effects may I notice from receiving this medicine? Side effects that you should report to your doctor or health care professional as soon as possible:  allergic reactions like skin rash, itching or hives, swelling of the face, lips, or tongue  breathing problems  changes in blood pressure  feeling faint or lightheaded, falls  fever or chills  flushing, sweating, or hot feelings  swelling of the ankles or feet Side effects that usually do not require medical attention (report to your doctor or health care professional if they continue or are bothersome):  diarrhea  headache  nausea, vomiting  stomach pain This list may not describe all possible side effects. Call your doctor for medical advice about side effects. You may report side effects to FDA at 1-800-FDA-1088. Where should I keep my medicine? This drug is given in a hospital or clinic and will not be stored at home. NOTE: This sheet is a summary. It may not cover all possible information. If you have questions about this medicine, talk to your doctor, pharmacist, or health care provider.  2021 Elsevier/Gold Standard (2016-03-29 20:21:10)  

## 2020-05-20 ENCOUNTER — Other Ambulatory Visit: Payer: Self-pay | Admitting: *Deleted

## 2020-05-20 DIAGNOSIS — D500 Iron deficiency anemia secondary to blood loss (chronic): Secondary | ICD-10-CM | POA: Diagnosis not present

## 2020-05-20 LAB — OCCULT BLOOD X 1 CARD TO LAB, STOOL
Fecal Occult Bld: NEGATIVE
Fecal Occult Bld: NEGATIVE
Fecal Occult Bld: NEGATIVE

## 2020-06-03 ENCOUNTER — Inpatient Hospital Stay: Payer: 59 | Attending: Internal Medicine

## 2020-06-03 ENCOUNTER — Other Ambulatory Visit: Payer: Self-pay

## 2020-06-03 ENCOUNTER — Inpatient Hospital Stay (HOSPITAL_BASED_OUTPATIENT_CLINIC_OR_DEPARTMENT_OTHER): Payer: 59 | Admitting: Internal Medicine

## 2020-06-03 VITALS — BP 111/66 | HR 77 | Temp 97.4°F | Resp 19 | Ht 65.0 in | Wt 144.9 lb

## 2020-06-03 DIAGNOSIS — N920 Excessive and frequent menstruation with regular cycle: Secondary | ICD-10-CM | POA: Insufficient documentation

## 2020-06-03 DIAGNOSIS — E039 Hypothyroidism, unspecified: Secondary | ICD-10-CM | POA: Diagnosis not present

## 2020-06-03 DIAGNOSIS — R5383 Other fatigue: Secondary | ICD-10-CM | POA: Insufficient documentation

## 2020-06-03 DIAGNOSIS — D500 Iron deficiency anemia secondary to blood loss (chronic): Secondary | ICD-10-CM | POA: Diagnosis not present

## 2020-06-03 LAB — CBC WITH DIFFERENTIAL (CANCER CENTER ONLY)
Abs Immature Granulocytes: 0.03 10*3/uL (ref 0.00–0.07)
Basophils Absolute: 0.1 10*3/uL (ref 0.0–0.1)
Basophils Relative: 1 %
Eosinophils Absolute: 0.2 10*3/uL (ref 0.0–0.5)
Eosinophils Relative: 2 %
HCT: 33.7 % — ABNORMAL LOW (ref 36.0–46.0)
Hemoglobin: 10.7 g/dL — ABNORMAL LOW (ref 12.0–15.0)
Immature Granulocytes: 0 %
Lymphocytes Relative: 28 %
Lymphs Abs: 2.5 10*3/uL (ref 0.7–4.0)
MCH: 26.3 pg (ref 26.0–34.0)
MCHC: 31.8 g/dL (ref 30.0–36.0)
MCV: 82.8 fL (ref 80.0–100.0)
Monocytes Absolute: 0.4 10*3/uL (ref 0.1–1.0)
Monocytes Relative: 5 %
Neutro Abs: 5.7 10*3/uL (ref 1.7–7.7)
Neutrophils Relative %: 64 %
Platelet Count: 283 10*3/uL (ref 150–400)
RBC: 4.07 MIL/uL (ref 3.87–5.11)
RDW: 27.1 % — ABNORMAL HIGH (ref 11.5–15.5)
WBC Count: 8.8 10*3/uL (ref 4.0–10.5)
nRBC: 0 % (ref 0.0–0.2)

## 2020-06-03 LAB — IRON AND TIBC
Iron: 64 ug/dL (ref 41–142)
Saturation Ratios: 23 % (ref 21–57)
TIBC: 280 ug/dL (ref 236–444)
UIBC: 216 ug/dL (ref 120–384)

## 2020-06-03 LAB — FERRITIN: Ferritin: 62 ng/mL (ref 11–307)

## 2020-06-03 NOTE — Progress Notes (Signed)
[facility] [phone]   [phone]  OFFICE PROGRESS NOTE  Seward Carol, MD [address]  [place] [address]  DIAGNOSIS: severe iron deficiency anemia secondary to chronic gynecologic blood loss.  PRIOR THERAPY: Iron infusion with Feraheme 510 mg IV weekly for 2 doses.  Last dose was given on May 04, 2020.  CURRENT THERAPY: Oral iron tablet with Integra +1 capsule p.o. daily  INTERVAL HISTORY: Briana Fuentes 45 y.o. female returns to the clinic today for follow-up visit.  The patient continues to complain of fatigue even after the iron infusion.  She denied having any dizzy spells.  She denied having any chest pain, shortness of breath, cough or hemoptysis.  She denied having any fever or chills.  She has no nausea, vomiting, diarrhea or constipation.  She denied having any headache or visual changes.  She has no weight loss or night sweats.  She is here today for evaluation and repeat blood work for evaluation of her anemia.  MEDICAL HISTORY: Past Medical History:  Diagnosis Date  . Diabetes mellitus without complication (HCC)    Gestational diabetes  . Gout   . Hypothyroidism   . Infertility, female   . Neuropathy     ALLERGIES:  has No Known Allergies.  MEDICATIONS:  Current Outpatient Medications  Medication Sig Dispense Refill  . FeFum-FePoly-FA-B Cmp-C-Biot (INTEGRA PLUS) CAPS Take 1 capsule by mouth daily. 30 capsule 3  . levothyroxine (SYNTHROID) 125 MCG tablet levothyroxine 125 mcg tablet  TAKE 1 TABLET BY MOUTH EVERY MORNING ON AN EMPTY STOMACH    . metFORMIN (GLUCOPHAGE-XR) 500 MG 24 hr tablet TAKE 1 TABLET BY MOUTH TWICE A DAY WITH MEALS 60 tablet 0  . pregabalin (LYRICA) 100 MG capsule Take 150 mg by mouth 2 (two) times daily.      No current facility-administered medications for this visit.    SURGICAL HISTORY:  Past Surgical History:  Procedure Laterality Date  . CESAREAN SECTION N/A 08/10/2012    Procedure: CESAREAN SECTION;  Surgeon: Mora Bellman, MD;  Location: [facility];  Service: Obstetrics;  Laterality: N/A;  Primary Cesarean Section Delivery Baby Girl @ 2126, Apgars 9/9  . THYROIDECTOMY      REVIEW OF SYSTEMS:  A comprehensive review of systems was negative except for: Constitutional: positive for fatigue   PHYSICAL EXAMINATION: General appearance: alert, cooperative, fatigued and no distress Head: Normocephalic, without obvious abnormality, atraumatic Neck: no adenopathy, no JVD, supple, symmetrical, trachea midline and thyroid not enlarged, symmetric, no tenderness/mass/nodules Lymph nodes: Cervical, supraclavicular, and axillary nodes normal. Resp: clear to auscultation bilaterally Back: symmetric, no curvature. ROM normal. No CVA tenderness. Cardio: regular rate and rhythm, S1, S2 normal, no murmur, click, rub or gallop GI: soft, non-tender; bowel sounds normal; no masses,  no organomegaly Extremities: extremities normal, atraumatic, no cyanosis or edema  ECOG PERFORMANCE STATUS: 1 - Symptomatic but completely ambulatory  Blood pressure 111/66, pulse 77, temperature (!) 97.4 F (36.3 C), temperature source Tympanic, resp. rate 19, height 5\' 5"  (1.651 m), weight 144 lb 14.4 oz (65.7 kg), SpO2 100 %.  LABORATORY DATA: Lab Results  Component Value Date   WBC 7.2 04/22/2020   HGB 6.9 (LL) 04/22/2020   HCT 26.3 (L) 04/22/2020   MCV 67.6 (L) 04/22/2020   PLT 457 (H) 04/22/2020      Chemistry      Component Value Date/Time   NA 137 04/22/2020 1037   K 3.9 04/22/2020 1037  CL 103 04/22/2020 1037   CO2 23 04/22/2020 1037   BUN 8 04/22/2020 1037   CREATININE 0.71 04/22/2020 1037   CREATININE 0.51 05/01/2012 1104      Component Value Date/Time   CALCIUM 8.9 04/22/2020 1037   ALKPHOS 55 04/22/2020 1037   AST 10 (L) 04/22/2020 1037   ALT 10 04/22/2020 1037   BILITOT 0.3 04/22/2020 1037       RADIOGRAPHIC STUDIES: No results found.  ASSESSMENT AND PLAN: This  is a very pleasant 45 years old female with persistent iron deficiency anemia secondary to menorrhagia.  The patient was treated recently with iron infusion with Feraheme 510 mg IV weekly for 2 doses.  She is also on oral iron tablets with Integra +1 capsule p.o. daily. She continues to complain of fatigue.  I recommended for the patient to have repeat CBC and iron study today for evaluation of her condition. If she continues to have iron deficiency, I will arrange for the patient to receive few more doses of iron infusion with Feraheme. I will see her back for follow-up visit in 3 months for evaluation and repeat blood work. She was advised to continue her current treatment with Integra +1 capsule p.o. daily. The patient was also advised to call immediately if she has any other concerning symptoms in the interval. The patient voices understanding of current disease status and treatment options and is in agreement with the current care plan.  All questions were answered. The patient knows to call the clinic with any problems, questions or concerns. We can certainly see the patient much sooner if necessary.  Disclaimer: This note was dictated with voice recognition software. Similar sounding words can inadvertently be transcribed and may not be corrected upon review.

## 2020-06-04 ENCOUNTER — Telehealth: Payer: Self-pay | Admitting: Internal Medicine

## 2020-06-04 NOTE — Telephone Encounter (Signed)
Scheduled per los. Called, not able to leave msg. Mailed printout  

## 2020-06-05 ENCOUNTER — Telehealth: Payer: Self-pay | Admitting: Medical Oncology

## 2020-06-05 NOTE — Telephone Encounter (Signed)
Pt notified of lab results from yesterday.

## 2020-08-28 ENCOUNTER — Telehealth: Payer: Self-pay | Admitting: Internal Medicine

## 2020-08-28 NOTE — Telephone Encounter (Signed)
Cancelled appts per 7/7 sch msg. Pt said she is going out of the country.She will call back to r/s when she comes back.

## 2020-09-01 ENCOUNTER — Ambulatory Visit: Payer: 59 | Admitting: Internal Medicine

## 2020-09-01 ENCOUNTER — Other Ambulatory Visit: Payer: 59

## 2021-02-19 ENCOUNTER — Encounter: Payer: Self-pay | Admitting: Internal Medicine

## 2021-06-08 DIAGNOSIS — M2042 Other hammer toe(s) (acquired), left foot: Secondary | ICD-10-CM | POA: Diagnosis not present

## 2021-06-08 DIAGNOSIS — M792 Neuralgia and neuritis, unspecified: Secondary | ICD-10-CM | POA: Diagnosis not present

## 2021-06-30 ENCOUNTER — Telehealth: Payer: Self-pay | Admitting: Internal Medicine

## 2021-06-30 NOTE — Telephone Encounter (Signed)
.  Called patient to schedule appointment per 5/8 inbasket,pt req, patient is aware of date and time.   ?

## 2021-07-06 ENCOUNTER — Other Ambulatory Visit: Payer: Self-pay

## 2021-07-06 DIAGNOSIS — D500 Iron deficiency anemia secondary to blood loss (chronic): Secondary | ICD-10-CM

## 2021-07-07 ENCOUNTER — Inpatient Hospital Stay: Payer: BC Managed Care – PPO | Attending: Internal Medicine | Admitting: Internal Medicine

## 2021-07-07 ENCOUNTER — Inpatient Hospital Stay: Payer: BC Managed Care – PPO

## 2021-07-07 ENCOUNTER — Telehealth: Payer: Self-pay

## 2021-07-07 NOTE — Telephone Encounter (Signed)
I attempted to reach pt regarding her missed appt today. I was unable to leave a message on the home number as there was no answer. Her cell phone listed has no voicemail setup. I also attempted her husband number, which went straight to voicemail. I have left a message requesting she call back to r/s. ?

## 2021-08-03 DIAGNOSIS — G609 Hereditary and idiopathic neuropathy, unspecified: Secondary | ICD-10-CM | POA: Diagnosis not present

## 2021-08-03 DIAGNOSIS — E559 Vitamin D deficiency, unspecified: Secondary | ICD-10-CM | POA: Diagnosis not present

## 2021-08-03 DIAGNOSIS — E1165 Type 2 diabetes mellitus with hyperglycemia: Secondary | ICD-10-CM | POA: Diagnosis not present

## 2021-08-03 DIAGNOSIS — E039 Hypothyroidism, unspecified: Secondary | ICD-10-CM | POA: Diagnosis not present

## 2021-08-03 DIAGNOSIS — D649 Anemia, unspecified: Secondary | ICD-10-CM | POA: Diagnosis not present

## 2021-08-05 ENCOUNTER — Other Ambulatory Visit: Payer: Self-pay

## 2021-08-05 ENCOUNTER — Encounter: Payer: Self-pay | Admitting: Internal Medicine

## 2021-08-05 ENCOUNTER — Other Ambulatory Visit: Payer: Self-pay | Admitting: Pharmacy Technician

## 2021-08-05 ENCOUNTER — Inpatient Hospital Stay (HOSPITAL_BASED_OUTPATIENT_CLINIC_OR_DEPARTMENT_OTHER): Payer: BC Managed Care – PPO | Admitting: Internal Medicine

## 2021-08-05 ENCOUNTER — Telehealth: Payer: Self-pay | Admitting: Pharmacy Technician

## 2021-08-05 ENCOUNTER — Inpatient Hospital Stay: Payer: BC Managed Care – PPO | Attending: Internal Medicine

## 2021-08-05 VITALS — BP 128/75 | HR 76 | Temp 97.8°F | Resp 17 | Ht 65.0 in | Wt 147.2 lb

## 2021-08-05 DIAGNOSIS — D500 Iron deficiency anemia secondary to blood loss (chronic): Secondary | ICD-10-CM | POA: Insufficient documentation

## 2021-08-05 DIAGNOSIS — N920 Excessive and frequent menstruation with regular cycle: Secondary | ICD-10-CM | POA: Diagnosis not present

## 2021-08-05 DIAGNOSIS — Z79899 Other long term (current) drug therapy: Secondary | ICD-10-CM | POA: Diagnosis not present

## 2021-08-05 LAB — CBC WITH DIFFERENTIAL (CANCER CENTER ONLY)
Abs Immature Granulocytes: 0.03 10*3/uL (ref 0.00–0.07)
Basophils Absolute: 0.1 10*3/uL (ref 0.0–0.1)
Basophils Relative: 1 %
Eosinophils Absolute: 0.2 10*3/uL (ref 0.0–0.5)
Eosinophils Relative: 2 %
HCT: 25.8 % — ABNORMAL LOW (ref 36.0–46.0)
Hemoglobin: 7.2 g/dL — ABNORMAL LOW (ref 12.0–15.0)
Immature Granulocytes: 0 %
Lymphocytes Relative: 28 %
Lymphs Abs: 2.2 10*3/uL (ref 0.7–4.0)
MCH: 19.3 pg — ABNORMAL LOW (ref 26.0–34.0)
MCHC: 27.9 g/dL — ABNORMAL LOW (ref 30.0–36.0)
MCV: 69 fL — ABNORMAL LOW (ref 80.0–100.0)
Monocytes Absolute: 0.6 10*3/uL (ref 0.1–1.0)
Monocytes Relative: 8 %
Neutro Abs: 4.9 10*3/uL (ref 1.7–7.7)
Neutrophils Relative %: 61 %
Platelet Count: 439 10*3/uL — ABNORMAL HIGH (ref 150–400)
RBC: 3.74 MIL/uL — ABNORMAL LOW (ref 3.87–5.11)
RDW: 17 % — ABNORMAL HIGH (ref 11.5–15.5)
WBC Count: 8 10*3/uL (ref 4.0–10.5)
nRBC: 0 % (ref 0.0–0.2)

## 2021-08-05 LAB — IRON AND IRON BINDING CAPACITY (CC-WL,HP ONLY)
Iron: 15 ug/dL — ABNORMAL LOW (ref 28–170)
Saturation Ratios: 3 % — ABNORMAL LOW (ref 10.4–31.8)
TIBC: 512 ug/dL — ABNORMAL HIGH (ref 250–450)
UIBC: 497 ug/dL

## 2021-08-05 LAB — FERRITIN: Ferritin: 2 ng/mL — ABNORMAL LOW (ref 11–307)

## 2021-08-05 NOTE — Telephone Encounter (Signed)
Auth Submission: no auth needed Payer: [employer] Medication & CPT/J Code(s) submitted: Venofer (Iron Sucrose) J1756 Route of submission (phone, fax, portal): PORTAL Auth type: Buy/Bill Units/visits requested: X3 DOSES Reference number:  Approval from: 08/05/21 to 08/10/21

## 2021-08-05 NOTE — Progress Notes (Signed)
[facility] [phone]   Fax:(336) [phone]  OFFICE PROGRESS NOTE  Seward Carol, MD [address] [place] [address]  DIAGNOSIS: severe iron deficiency anemia secondary to chronic gynecologic blood loss.  PRIOR THERAPY: Iron infusion with Feraheme 510 mg IV weekly for 2 doses.  Last dose was given on May 04, 2020.  CURRENT THERAPY: Oral iron tablet with Integra +1 capsule p.o. daily  INTERVAL HISTORY: Briana Fuentes 46 y.o. female returns to the clinic today for follow-up visit.  The patient is feeling fine today with no concerning complaints except for the baseline fatigue that has been getting worse recently.  She was not compliant with the oral iron tablets because of constipation.  She did not take it as prescribed.  She was lost to follow-up for more than a year.  She presented today for reevaluation.  She has no current chest pain, shortness of breath, cough or hemoptysis.  She has no bleeding, bruises or ecchymosis.  She continues to have menorrhagia with prolonged menstrual period.  She was supposed to see a gynecologist for evaluation but her appointment is next months.  The patient is here today for evaluation and repeat blood work.   MEDICAL HISTORY: Past Medical History:  Diagnosis Date   Diabetes mellitus without complication ([facility])    Gestational diabetes   Gout    Hypothyroidism    Infertility, female    Neuropathy     ALLERGIES:  has No Known Allergies.  MEDICATIONS:  Current Outpatient Medications  Medication Sig Dispense Refill   FeFum-FePoly-FA-B Cmp-C-Biot (INTEGRA PLUS) CAPS Take 1 capsule by mouth daily. 30 capsule 3   levothyroxine (SYNTHROID) 125 MCG tablet levothyroxine 125 mcg tablet  TAKE 1 TABLET BY MOUTH EVERY MORNING ON AN EMPTY STOMACH     metFORMIN (GLUCOPHAGE-XR) 500 MG 24 hr tablet TAKE 1 TABLET BY MOUTH TWICE A DAY WITH MEALS 60 tablet 0   pregabalin (LYRICA) 100 MG capsule Take 150 mg by mouth  2 (two) times daily.      No current facility-administered medications for this visit.    SURGICAL HISTORY:  Past Surgical History:  Procedure Laterality Date   CESAREAN SECTION N/A 08/10/2012   Procedure: CESAREAN SECTION;  Surgeon: Mora Bellman, MD;  Location: [facility];  Service: Obstetrics;  Laterality: N/A;  Primary Cesarean Section Delivery Baby Girl @ 2126, Apgars 9/9   THYROIDECTOMY      REVIEW OF SYSTEMS:  A comprehensive review of systems was negative except for: Constitutional: positive for fatigue   PHYSICAL EXAMINATION: General appearance: alert, cooperative, fatigued, and no distress Head: Normocephalic, without obvious abnormality, atraumatic Neck: no adenopathy, no JVD, supple, symmetrical, trachea midline, and thyroid not enlarged, symmetric, no tenderness/mass/nodules Lymph nodes: Cervical, supraclavicular, and axillary nodes normal. Resp: clear to auscultation bilaterally Back: symmetric, no curvature. ROM normal. No CVA tenderness. Cardio: regular rate and rhythm, S1, S2 normal, no murmur, click, rub or gallop GI: soft, non-tender; bowel sounds normal; no masses,  no organomegaly Extremities: extremities normal, atraumatic, no cyanosis or edema  ECOG PERFORMANCE STATUS: 1 - Symptomatic but completely ambulatory  Blood pressure 128/75, pulse 76, temperature 97.8 F (36.6 C), temperature source Tympanic, resp. rate 17, height '5\' 5"'$  (1.651 m), weight 147 lb 3.2 oz (66.8 kg), SpO2 98 %.  LABORATORY DATA: Lab Results  Component Value Date   WBC 8.0 08/05/2021   HGB 7.2 (L) 08/05/2021   HCT 25.8 (L) 08/05/2021   MCV 69.0 (  L) 08/05/2021   PLT 439 (H) 08/05/2021      Chemistry      Component Value Date/Time   NA 137 04/22/2020 1037   K 3.9 04/22/2020 1037   CL 103 04/22/2020 1037   CO2 23 04/22/2020 1037   BUN 8 04/22/2020 1037   CREATININE 0.71 04/22/2020 1037   CREATININE 0.51 05/01/2012 1104      Component Value Date/Time   CALCIUM 8.9 04/22/2020 1037    ALKPHOS 55 04/22/2020 1037   AST 10 (L) 04/22/2020 1037   ALT 10 04/22/2020 1037   BILITOT 0.3 04/22/2020 1037       RADIOGRAPHIC STUDIES: No results found.  ASSESSMENT AND PLAN: This is a very pleasant 46 years old female with persistent iron deficiency anemia secondary to menorrhagia.  The patient was treated recently with iron infusion with Feraheme 510 mg IV weekly for 2 doses.  Her iron infusion was more than a year ago.  The patient was supposed to be taking oral iron tablets but she was not compliant because of side effect and constipation. She presented today for evaluation and repeat blood work.  Repeat CBC today showed hemoglobin was down to 7.2 and hematocrit 25.8%.  Her platelets count was also reactively elevated at 439,000.  Serum iron was 15 with iron saturation of 3% with TIBC of 512.  Ferritin level is still pending. I recommended for the patient to proceed with iron infusion again with Venofer 300 mg IV weekly for 3 weeks. I will see her back for follow-up visit in 3 months for evaluation and repeat CBC, iron study and ferritin. I strongly advised the patient to be compliant with her follow-up visit and lab work. She was also advised to keep her appointment with the gynecologist for evaluation of her condition and help with the menorrhagia. The patient was advised to call immediately if she has any other concerning issues in the interval. The patient voices understanding of current disease status and treatment options and is in agreement with the current care plan.  All questions were answered. The patient knows to call the clinic with any problems, questions or concerns. We can certainly see the patient much sooner if necessary.  Disclaimer: This note was dictated with voice recognition software. Similar sounding words can inadvertently be transcribed and may not be corrected upon review.

## 2021-08-12 ENCOUNTER — Ambulatory Visit (INDEPENDENT_AMBULATORY_CARE_PROVIDER_SITE_OTHER): Payer: BC Managed Care – PPO | Admitting: *Deleted

## 2021-08-12 VITALS — BP 116/77 | HR 71 | Temp 97.8°F | Resp 18 | Ht 65.0 in | Wt 147.0 lb

## 2021-08-12 DIAGNOSIS — D500 Iron deficiency anemia secondary to blood loss (chronic): Secondary | ICD-10-CM | POA: Diagnosis not present

## 2021-08-12 MED ORDER — SODIUM CHLORIDE 0.9 % IV SOLN
300.0000 mg | INTRAVENOUS | Status: DC
  Administered 2021-08-12: 300 mg via INTRAVENOUS
  Filled 2021-08-12: qty 15

## 2021-08-12 NOTE — Progress Notes (Cosign Needed)
Diagnosis: Iron Deficiency Anemia  Provider:  Marshell Garfinkel, MD  Procedure: Infusion  IV Type: Peripheral, IV Location: R Hand  Venofer (Iron Sucrose), Dose: 300 mg  Infusion Start Time: 3887 am  Infusion Stop Time: 1550 pm  Post Infusion IV Care: Observation period completed and Peripheral IV Discontinued  Discharge: Condition: Good, Destination: Home . AVS provided to patient.   Performed by:  Oren Beckmann, RN

## 2021-08-12 NOTE — Patient Instructions (Signed)

## 2021-08-19 ENCOUNTER — Ambulatory Visit (INDEPENDENT_AMBULATORY_CARE_PROVIDER_SITE_OTHER): Payer: BC Managed Care – PPO

## 2021-08-19 VITALS — BP 122/80 | HR 69 | Temp 97.7°F | Resp 18 | Ht 65.0 in | Wt 145.2 lb

## 2021-08-19 DIAGNOSIS — D500 Iron deficiency anemia secondary to blood loss (chronic): Secondary | ICD-10-CM | POA: Diagnosis not present

## 2021-08-19 MED ORDER — SODIUM CHLORIDE 0.9 % IV SOLN
300.0000 mg | INTRAVENOUS | Status: DC
  Administered 2021-08-19: 300 mg via INTRAVENOUS
  Filled 2021-08-19: qty 15

## 2021-08-19 NOTE — Progress Notes (Signed)
Diagnosis: Iron Deficiency Anemia  Provider:  Marshell Garfinkel, MD  Procedure: Infusion  IV Type: Peripheral, IV Location: R Hand  Venofer (Iron Sucrose), Dose: 300 mg  Infusion Start Time: 2787  Infusion Stop Time: 1836  Post Infusion IV Care: Peripheral IV Discontinued  Discharge: Condition: Good, Destination: Home . AVS provided to patient.   Performed by:  Koren Shiver, RN

## 2021-08-26 ENCOUNTER — Ambulatory Visit (INDEPENDENT_AMBULATORY_CARE_PROVIDER_SITE_OTHER): Payer: BC Managed Care – PPO

## 2021-08-26 ENCOUNTER — Other Ambulatory Visit: Payer: Self-pay | Admitting: Internal Medicine

## 2021-08-26 VITALS — BP 106/70 | HR 66 | Temp 97.7°F | Resp 18 | Ht 65.0 in | Wt 145.0 lb

## 2021-08-26 DIAGNOSIS — D500 Iron deficiency anemia secondary to blood loss (chronic): Secondary | ICD-10-CM

## 2021-08-26 MED ORDER — SODIUM CHLORIDE 0.9 % IV SOLN
300.0000 mg | INTRAVENOUS | Status: DC
  Administered 2021-08-26: 300 mg via INTRAVENOUS
  Filled 2021-08-26: qty 15

## 2021-08-26 NOTE — Progress Notes (Signed)
Diagnosis: Iron Deficiency Anemia  Provider:  Marshell Garfinkel, MD  Procedure: Infusion  IV Type: Peripheral, IV Location: R Hand  Venofer (Iron Sucrose), Dose: 300 mg  Infusion Start Time: 6644  Infusion Stop Time: 0347  Post Infusion IV Care: Peripheral IV Discontinued  Discharge: Condition: Good, Destination: Home . AVS provided to patient.   Performed by:  Adelina Mings, LPN

## 2021-10-06 DIAGNOSIS — E559 Vitamin D deficiency, unspecified: Secondary | ICD-10-CM | POA: Diagnosis not present

## 2021-10-06 DIAGNOSIS — E1165 Type 2 diabetes mellitus with hyperglycemia: Secondary | ICD-10-CM | POA: Diagnosis not present

## 2021-10-06 DIAGNOSIS — G609 Hereditary and idiopathic neuropathy, unspecified: Secondary | ICD-10-CM | POA: Diagnosis not present

## 2021-10-06 DIAGNOSIS — E039 Hypothyroidism, unspecified: Secondary | ICD-10-CM | POA: Diagnosis not present

## 2021-11-05 ENCOUNTER — Inpatient Hospital Stay: Payer: BC Managed Care – PPO | Admitting: Internal Medicine

## 2021-11-05 ENCOUNTER — Inpatient Hospital Stay: Payer: BC Managed Care – PPO | Attending: Internal Medicine

## 2021-11-12 ENCOUNTER — Ambulatory Visit: Payer: BC Managed Care – PPO | Admitting: Podiatry

## 2021-11-12 DIAGNOSIS — M7741 Metatarsalgia, right foot: Secondary | ICD-10-CM

## 2021-11-12 DIAGNOSIS — B351 Tinea unguium: Secondary | ICD-10-CM | POA: Diagnosis not present

## 2021-11-12 DIAGNOSIS — M7742 Metatarsalgia, left foot: Secondary | ICD-10-CM | POA: Diagnosis not present

## 2021-11-12 DIAGNOSIS — Z79899 Other long term (current) drug therapy: Secondary | ICD-10-CM | POA: Diagnosis not present

## 2021-11-12 MED ORDER — OXYCODONE-ACETAMINOPHEN 5-325 MG PO TABS: 1.0000 | ORAL_TABLET | ORAL | 0 refills | Status: DC | PRN

## 2021-11-12 NOTE — Progress Notes (Signed)
Subjective:  Patient ID: Briana Fuentes, female    DOB: April 23, 1975,  MRN: [id number]  Chief Complaint  Patient presents with   Nail Problem    Possible nail fungus     46 y.o. female presents with the above complaint.  Patient presents with complaint of thickened elongated dystrophic toenails x2 bilateral hallux as well as bilateral forefoot pain.  Patient states is painful to touch is progressive gotten worse worse with ambulation hurts with pressure.  She is a diabetic.  She has not seen anyone else prior to seeing me for this.  She would like to discuss treatment options for this pain scale is 7 out of 10 hurts with ambulation hurts with pressure   Review of Systems: Negative except as noted in the HPI. Denies N/V/F/Ch.  Past Medical History:  Diagnosis Date   Diabetes mellitus without complication ([facility])    Gestational diabetes   Gout    Hypothyroidism    Infertility, female    Neuropathy     Current Outpatient Medications:    oxyCODONE-acetaminophen (PERCOCET) 5-325 MG tablet, Take 1 tablet by mouth every 4 (four) hours as needed for severe pain., Disp: 30 tablet, Rfl: 0   allopurinol (ZYLOPRIM) 100 MG tablet, Take 200 mg by mouth daily., Disp: , Rfl:    FeFum-FePoly-FA-B Cmp-C-Biot (INTEGRA PLUS) CAPS, TAKE 1 CAPSULE BY MOUTH DAILY, Disp: 30 capsule, Rfl: 3   fenofibrate micronized (LOFIBRA) 134 MG capsule, Take 134 mg by mouth daily., Disp: , Rfl:    levothyroxine (SYNTHROID) 125 MCG tablet, levothyroxine 125 mcg tablet  TAKE 1 TABLET BY MOUTH EVERY MORNING ON AN EMPTY STOMACH, Disp: , Rfl:    meloxicam (MOBIC) 7.5 MG tablet, Take 7.5 mg by mouth daily., Disp: , Rfl:    metFORMIN (GLUCOPHAGE-XR) 500 MG 24 hr tablet, TAKE 1 TABLET BY MOUTH TWICE A DAY WITH MEALS, Disp: 60 tablet, Rfl: 0   pregabalin (LYRICA) 100 MG capsule, Take 150 mg by mouth 2 (two) times daily. , Disp: , Rfl:    rosuvastatin (CRESTOR) 10 MG tablet, Take 10 mg by mouth daily., Disp: , Rfl:    Vitamin D,  Ergocalciferol, (DRISDOL) 1.25 MG (50000 UNIT) CAPS capsule, Take 50,000 Units by mouth once a week., Disp: , Rfl:   Social History   Tobacco Use  Smoking Status Never  Smokeless Tobacco Never    No Known Allergies Objective:  There were no vitals filed for this visit. There is no height or weight on file to calculate BMI. Constitutional Well developed. Well nourished.  Vascular Dorsalis pedis pulses palpable bilaterally. Posterior tibial pulses palpable bilaterally. Capillary refill normal to all digits.  No cyanosis or clubbing noted. Pedal hair growth normal.  Neurologic Normal speech. Oriented to person, place, and time. Epicritic sensation to light touch grossly present bilaterally.  Dermatologic Nails thickened elongated dystrophic mycotic toenails x2 bilateral hallux.  Mild pain on palpation Skin within normal limits  Orthopedic: Bilateral generalized forefoot metatarsalgia noted likely due to foot structure with anterior forefoot pressure.  Reduction of some plantar fat pad noted as well leading to extra pressure.  Negative Mulder's click or signs of neuroma noted no capsulitis noted   Radiographs: None Assessment:   1. Long-term use of high-risk medication   2. Nail fungus   3. Onychomycosis due to dermatophyte   4. Metatarsalgia of both feet    Plan:  Patient was evaluated and treated and all questions answered.  Bilateral forefoot metatarsalgia - I explained to the patient the  etiology of metatarsalgia and various treatment options were discussed.  Given the amount of pain that she is having she will benefit from metatarsal pad and shoe gear modification metatarsal pads were dispensed I discussed shoe gear modification extensive detail as well.  If there is an improvement we will discuss custom orthotics  Bilateral hallux onychomycosis Educated the patient on the etiology of onychomycosis and various treatment options associated with improving the fungal load.  I  explained to the patient that there is 3 treatment options available to treat the onychomycosis including topical, p.o., laser treatment.  Patient elected to undergo p.o. options with Lamisil/terbinafine therapy.  In order for me to start the medication therapy, I explained to the patient the importance of evaluating the liver and obtaining the liver function test.  Once the liver function test comes back normal I will start him on 33-monthcourse of Lamisil therapy.  Patient understood all risk and would like to proceed with Lamisil therapy.  I have asked the patient to immediately stop the Lamisil therapy if she has any reactions to it and call the office or go to the emergency room right away.  Patient states understanding  No follow-ups on file.  Bilateral hallux nail fungus liver function test Lamisil  Bilateral forefoot metatarsalgia metatarsal pads think about orthotics

## 2021-11-17 ENCOUNTER — Other Ambulatory Visit: Payer: Self-pay | Admitting: Podiatry

## 2021-11-17 DIAGNOSIS — Z79899 Other long term (current) drug therapy: Secondary | ICD-10-CM | POA: Diagnosis not present

## 2021-11-18 LAB — HEPATIC FUNCTION PANEL
ALT: 20 IU/L (ref 0–32)
AST: 19 IU/L (ref 0–40)
Albumin: 4.7 g/dL (ref 3.9–4.9)
Alkaline Phosphatase: 60 IU/L (ref 44–121)
Bilirubin Total: 0.2 mg/dL (ref 0.0–1.2)
Bilirubin, Direct: <0.1 mg/dL (ref 0.00–0.40)
Total Protein: 7.9 g/dL (ref 6.0–8.5)

## 2021-11-20 MED ORDER — TERBINAFINE HCL 250 MG PO TABS: 250.0000 mg | ORAL_TABLET | Freq: Every day | ORAL | 0 refills | Status: DC

## 2021-11-20 NOTE — Addendum Note (Signed)
Addended by: Boneta Lucks on: 11/20/2021 02:12 PM   Modules accepted: Orders

## 2021-11-24 ENCOUNTER — Other Ambulatory Visit: Payer: Self-pay | Admitting: Physician Assistant

## 2021-11-24 DIAGNOSIS — R1310 Dysphagia, unspecified: Secondary | ICD-10-CM | POA: Diagnosis not present

## 2021-11-24 DIAGNOSIS — N920 Excessive and frequent menstruation with regular cycle: Secondary | ICD-10-CM | POA: Diagnosis not present

## 2021-11-24 DIAGNOSIS — D649 Anemia, unspecified: Secondary | ICD-10-CM | POA: Diagnosis not present

## 2021-11-24 DIAGNOSIS — D509 Iron deficiency anemia, unspecified: Secondary | ICD-10-CM | POA: Diagnosis not present

## 2021-11-30 ENCOUNTER — Inpatient Hospital Stay: Payer: BC Managed Care – PPO

## 2021-11-30 ENCOUNTER — Encounter: Payer: Self-pay | Admitting: Internal Medicine

## 2021-11-30 ENCOUNTER — Other Ambulatory Visit: Payer: Self-pay

## 2021-11-30 ENCOUNTER — Inpatient Hospital Stay: Payer: BC Managed Care – PPO | Attending: Internal Medicine | Admitting: Internal Medicine

## 2021-11-30 VITALS — BP 135/86 | HR 76 | Temp 98.7°F | Resp 17 | Wt 149.8 lb

## 2021-11-30 DIAGNOSIS — D500 Iron deficiency anemia secondary to blood loss (chronic): Secondary | ICD-10-CM | POA: Insufficient documentation

## 2021-11-30 DIAGNOSIS — N920 Excessive and frequent menstruation with regular cycle: Secondary | ICD-10-CM | POA: Diagnosis not present

## 2021-11-30 LAB — CBC WITH DIFFERENTIAL (CANCER CENTER ONLY)
Abs Immature Granulocytes: 0.03 10*3/uL (ref 0.00–0.07)
Basophils Absolute: 0.1 10*3/uL (ref 0.0–0.1)
Basophils Relative: 1 %
Eosinophils Absolute: 0.2 10*3/uL (ref 0.0–0.5)
Eosinophils Relative: 2 %
HCT: 38.9 % (ref 36.0–46.0)
Hemoglobin: 12.9 g/dL (ref 12.0–15.0)
Immature Granulocytes: 0 %
Lymphocytes Relative: 26 %
Lymphs Abs: 2.8 10*3/uL (ref 0.7–4.0)
MCH: 28.6 pg (ref 26.0–34.0)
MCHC: 33.2 g/dL (ref 30.0–36.0)
MCV: 86.3 fL (ref 80.0–100.0)
Monocytes Absolute: 0.6 10*3/uL (ref 0.1–1.0)
Monocytes Relative: 6 %
Neutro Abs: 6.9 10*3/uL (ref 1.7–7.7)
Neutrophils Relative %: 65 %
Platelet Count: 281 10*3/uL (ref 150–400)
RBC: 4.51 MIL/uL (ref 3.87–5.11)
RDW: 14.4 % (ref 11.5–15.5)
WBC Count: 10.6 10*3/uL — ABNORMAL HIGH (ref 4.0–10.5)
nRBC: 0 % (ref 0.0–0.2)

## 2021-11-30 MED ORDER — INTEGRA PLUS PO CAPS: 1.0000 | ORAL_CAPSULE | Freq: Every day | ORAL | 3 refills | Status: DC

## 2021-11-30 NOTE — Progress Notes (Signed)
[facility] [phone]   [phone]  OFFICE PROGRESS NOTE  Seward Carol, MD [address] [place] [address]  DIAGNOSIS: severe iron deficiency anemia secondary to chronic gynecologic blood loss.  PRIOR THERAPY:  1) Iron infusion with Feraheme 510 mg IV weekly for 2 doses.  Last dose was given on May 04, 2020. 2) iron infusion with Venofer 300 Mg IV weekly for 3 weeks last dose was given August 31, 2021  CURRENT THERAPY: Oral iron tablet with Integra +1 capsule p.o. daily  INTERVAL HISTORY: Briana Fuentes 46 y.o. female returns to the clinic today for follow-up visit.  The patient is feeling fine today with no concerning complaints.  She has less fatigue than before especially after the iron infusion.  She continues to have some pain in the lower extremities especially at nighttime.  She denied having any chest pain, shortness of breath, cough or hemoptysis.  She has no dizzy spells.  She has no nausea, vomiting, diarrhea or constipation.  She continues to have menorrhagia and her last menstrual bleeding has lasted for almost 22 days till now.  She is followed by gynecology and waiting for some intervention from them.  She is here today for evaluation and repeat blood work.   MEDICAL HISTORY: Past Medical History:  Diagnosis Date   Diabetes mellitus without complication ([place])    Gestational diabetes   Gout    Hypothyroidism    Infertility, female    Neuropathy     ALLERGIES:  has No Known Allergies.  MEDICATIONS:  Current Outpatient Medications  Medication Sig Dispense Refill   allopurinol (ZYLOPRIM) 100 MG tablet Take 200 mg by mouth daily.     FeFum-FePoly-FA-B Cmp-C-Biot (INTEGRA PLUS) CAPS TAKE 1 CAPSULE BY MOUTH DAILY 30 capsule 3   fenofibrate micronized (LOFIBRA) 134 MG capsule Take 134 mg by mouth daily.     levothyroxine (SYNTHROID) 125 MCG tablet levothyroxine 125 mcg tablet  TAKE 1 TABLET BY MOUTH EVERY  MORNING ON AN EMPTY STOMACH     meloxicam (MOBIC) 7.5 MG tablet Take 7.5 mg by mouth daily.     metFORMIN (GLUCOPHAGE-XR) 500 MG 24 hr tablet TAKE 1 TABLET BY MOUTH TWICE A DAY WITH MEALS 60 tablet 0   oxyCODONE-acetaminophen (PERCOCET) 5-325 MG tablet Take 1 tablet by mouth every 4 (four) hours as needed for severe pain. 30 tablet 0   pregabalin (LYRICA) 100 MG capsule Take 150 mg by mouth 2 (two) times daily.      rosuvastatin (CRESTOR) 10 MG tablet Take 10 mg by mouth daily.     terbinafine (LAMISIL) 250 MG tablet Take 1 tablet (250 mg total) by mouth daily. 90 tablet 0   Vitamin D, Ergocalciferol, (DRISDOL) 1.25 MG (50000 UNIT) CAPS capsule Take 50,000 Units by mouth once a week.     No current facility-administered medications for this visit.    SURGICAL HISTORY:  Past Surgical History:  Procedure Laterality Date   CESAREAN SECTION N/A 08/10/2012   Procedure: CESAREAN SECTION;  Surgeon: Mora Bellman, MD;  Location: [facility];  Service: Obstetrics;  Laterality: N/A;  Primary Cesarean Section Delivery Baby Girl @ 2126, Apgars 9/9   THYROIDECTOMY      REVIEW OF SYSTEMS:  A comprehensive review of systems was negative except for: Constitutional: positive for fatigue   PHYSICAL EXAMINATION: General appearance: alert, cooperative, fatigued, and no distress Head: Normocephalic, without obvious abnormality, atraumatic Neck: no adenopathy, no JVD, supple, symmetrical,  trachea midline, and thyroid not enlarged, symmetric, no tenderness/mass/nodules Lymph nodes: Cervical, supraclavicular, and axillary nodes normal. Resp: clear to auscultation bilaterally Back: symmetric, no curvature. ROM normal. No CVA tenderness. Cardio: regular rate and rhythm, S1, S2 normal, no murmur, click, rub or gallop GI: soft, non-tender; bowel sounds normal; no masses,  no organomegaly Extremities: extremities normal, atraumatic, no cyanosis or edema  ECOG PERFORMANCE STATUS: 1 - Symptomatic but completely  ambulatory  Blood pressure 135/86, pulse 76, temperature 98.7 F (37.1 C), temperature source Oral, resp. rate 17, weight 149 lb 12.8 oz (67.9 kg), SpO2 94 %.  LABORATORY DATA: Lab Results  Component Value Date   WBC 10.6 (H) 11/30/2021   HGB 12.9 11/30/2021   HCT 38.9 11/30/2021   MCV 86.3 11/30/2021   PLT 281 11/30/2021      Chemistry      Component Value Date/Time   NA 137 04/22/2020 1037   K 3.9 04/22/2020 1037   CL 103 04/22/2020 1037   CO2 23 04/22/2020 1037   BUN 8 04/22/2020 1037   CREATININE 0.71 04/22/2020 1037   CREATININE 0.51 05/01/2012 1104      Component Value Date/Time   CALCIUM 8.9 04/22/2020 1037   ALKPHOS 60 11/17/2021 1408   AST 19 11/17/2021 1408   AST 10 (L) 04/22/2020 1037   ALT 20 11/17/2021 1408   ALT 10 04/22/2020 1037   BILITOT 0.2 11/17/2021 1408   BILITOT 0.3 04/22/2020 1037       RADIOGRAPHIC STUDIES: No results found.  ASSESSMENT AND PLAN: This is a very pleasant 46 years old female with persistent iron deficiency anemia secondary to menorrhagia.  The patient was treated recently with iron infusion with Feraheme 510 mg IV weekly for 2 doses.  She also recently received iron infusion with Venofer 300 Mg IV weekly for 3 weeks last dose was given in July 2023. She noted significant improvement in her general condition after the iron infusion but she does not take the oral iron capsule as previously recommended. Repeat CBC today showed significant improvement of her hemoglobin up to 12.9 and hematocrit 38.9%.  Her iron study and ferritin performed on outside facility were within the normal range. I recommended for the patient to start the oral iron tablet with Integra +1 capsule p.o. daily. I will see her back for follow-up visit in 3 months for evaluation and repeat blood work. She was advised to call immediately if she has any other concerning symptoms in the interval. The patient voices understanding of current disease status and treatment  options and is in agreement with the current care plan.  All questions were answered. The patient knows to call the clinic with any problems, questions or concerns. We can certainly see the patient much sooner if necessary.  Disclaimer: This note was dictated with voice recognition software. Similar sounding words can inadvertently be transcribed and may not be corrected upon review.

## 2021-12-01 ENCOUNTER — Other Ambulatory Visit: Payer: BC Managed Care – PPO

## 2021-12-10 DIAGNOSIS — E0500 Thyrotoxicosis with diffuse goiter without thyrotoxic crisis or storm: Secondary | ICD-10-CM | POA: Diagnosis not present

## 2021-12-25 ENCOUNTER — Ambulatory Visit: Payer: BLUE CROSS/BLUE SHIELD | Admitting: Podiatry

## 2022-01-06 DIAGNOSIS — F411 Generalized anxiety disorder: Secondary | ICD-10-CM | POA: Diagnosis not present

## 2022-01-06 DIAGNOSIS — E039 Hypothyroidism, unspecified: Secondary | ICD-10-CM | POA: Diagnosis not present

## 2022-01-06 DIAGNOSIS — E781 Pure hyperglyceridemia: Secondary | ICD-10-CM | POA: Diagnosis not present

## 2022-01-06 DIAGNOSIS — M1A9XX0 Chronic gout, unspecified, without tophus (tophi): Secondary | ICD-10-CM | POA: Diagnosis not present

## 2022-01-06 DIAGNOSIS — Z0000 Encounter for general adult medical examination without abnormal findings: Secondary | ICD-10-CM | POA: Diagnosis not present

## 2022-01-06 DIAGNOSIS — E1140 Type 2 diabetes mellitus with diabetic neuropathy, unspecified: Secondary | ICD-10-CM | POA: Diagnosis not present

## 2022-03-02 ENCOUNTER — Inpatient Hospital Stay: Payer: BC Managed Care – PPO | Admitting: Internal Medicine

## 2022-03-02 ENCOUNTER — Inpatient Hospital Stay: Payer: BC Managed Care – PPO

## 2022-03-04 ENCOUNTER — Inpatient Hospital Stay: Payer: BC Managed Care – PPO

## 2022-03-04 ENCOUNTER — Inpatient Hospital Stay: Payer: BC Managed Care – PPO | Admitting: Internal Medicine

## 2022-03-08 DIAGNOSIS — H15102 Unspecified episcleritis, left eye: Secondary | ICD-10-CM | POA: Diagnosis not present

## 2022-03-08 DIAGNOSIS — E0500 Thyrotoxicosis with diffuse goiter without thyrotoxic crisis or storm: Secondary | ICD-10-CM | POA: Diagnosis not present

## 2022-03-08 DIAGNOSIS — H15002 Unspecified scleritis, left eye: Secondary | ICD-10-CM | POA: Diagnosis not present

## 2022-03-15 DIAGNOSIS — H15002 Unspecified scleritis, left eye: Secondary | ICD-10-CM | POA: Diagnosis not present

## 2022-03-15 DIAGNOSIS — H5712 Ocular pain, left eye: Secondary | ICD-10-CM | POA: Diagnosis not present

## 2022-04-06 ENCOUNTER — Encounter: Payer: Self-pay | Admitting: Internal Medicine

## 2022-05-31 DIAGNOSIS — E1165 Type 2 diabetes mellitus with hyperglycemia: Secondary | ICD-10-CM | POA: Diagnosis not present

## 2022-05-31 DIAGNOSIS — E785 Hyperlipidemia, unspecified: Secondary | ICD-10-CM | POA: Diagnosis not present

## 2022-05-31 DIAGNOSIS — E559 Vitamin D deficiency, unspecified: Secondary | ICD-10-CM | POA: Diagnosis not present

## 2022-05-31 DIAGNOSIS — G609 Hereditary and idiopathic neuropathy, unspecified: Secondary | ICD-10-CM | POA: Diagnosis not present

## 2022-05-31 DIAGNOSIS — E0500 Thyrotoxicosis with diffuse goiter without thyrotoxic crisis or storm: Secondary | ICD-10-CM | POA: Diagnosis not present

## 2022-05-31 DIAGNOSIS — E039 Hypothyroidism, unspecified: Secondary | ICD-10-CM | POA: Diagnosis not present

## 2022-05-31 DIAGNOSIS — D649 Anemia, unspecified: Secondary | ICD-10-CM | POA: Diagnosis not present

## 2022-05-31 DIAGNOSIS — R109 Unspecified abdominal pain: Secondary | ICD-10-CM | POA: Diagnosis not present

## 2022-06-01 DIAGNOSIS — N926 Irregular menstruation, unspecified: Secondary | ICD-10-CM | POA: Diagnosis not present

## 2022-06-03 ENCOUNTER — Emergency Department (HOSPITAL_COMMUNITY): Payer: 59

## 2022-06-03 ENCOUNTER — Other Ambulatory Visit: Payer: Self-pay

## 2022-06-03 ENCOUNTER — Encounter: Payer: Self-pay | Admitting: Internal Medicine

## 2022-06-03 ENCOUNTER — Encounter (HOSPITAL_COMMUNITY): Payer: Self-pay

## 2022-06-03 ENCOUNTER — Emergency Department (HOSPITAL_COMMUNITY)
Admission: EM | Admit: 2022-06-03 | Discharge: 2022-06-03 | Disposition: A | Discharge status: Home / Self Care | Payer: 59 | Attending: Emergency Medicine | Admitting: Emergency Medicine

## 2022-06-03 DIAGNOSIS — N938 Other specified abnormal uterine and vaginal bleeding: Secondary | ICD-10-CM | POA: Diagnosis not present

## 2022-06-03 DIAGNOSIS — K429 Umbilical hernia without obstruction or gangrene: Secondary | ICD-10-CM

## 2022-06-03 DIAGNOSIS — N390 Urinary tract infection, site not specified: Secondary | ICD-10-CM

## 2022-06-03 DIAGNOSIS — N83201 Unspecified ovarian cyst, right side: Secondary | ICD-10-CM

## 2022-06-03 DIAGNOSIS — N939 Abnormal uterine and vaginal bleeding, unspecified: Secondary | ICD-10-CM | POA: Diagnosis not present

## 2022-06-03 DIAGNOSIS — D259 Leiomyoma of uterus, unspecified: Secondary | ICD-10-CM | POA: Diagnosis not present

## 2022-06-03 DIAGNOSIS — K4090 Unilateral inguinal hernia, without obstruction or gangrene, not specified as recurrent: Secondary | ICD-10-CM | POA: Diagnosis not present

## 2022-06-03 LAB — COMPREHENSIVE METABOLIC PANEL
ALT: 19 U/L (ref 0–44)
AST: 16 U/L (ref 15–41)
Albumin: 3.7 g/dL (ref 3.5–5.0)
Alkaline Phosphatase: 58 U/L (ref 38–126)
Anion gap: 13 (ref 5–15)
BUN: 11 mg/dL (ref 6–20)
CO2: 23 mmol/L (ref 22–32)
Calcium: 9.3 mg/dL (ref 8.9–10.3)
Chloride: 98 mmol/L (ref 98–111)
Creatinine, Ser: 0.68 mg/dL (ref 0.44–1.00)
GFR, Estimated: >60 mL/min (ref >60)
Glucose, Bld: 136 mg/dL — ABNORMAL HIGH (ref 70–99)
Potassium: 4.1 mmol/L (ref 3.5–5.1)
Sodium: 134 mmol/L — ABNORMAL LOW (ref 135–145)
Total Bilirubin: 0.5 mg/dL (ref 0.3–1.2)
Total Protein: 8.5 g/dL — ABNORMAL HIGH (ref 6.5–8.1)

## 2022-06-03 LAB — URINALYSIS, ROUTINE W REFLEX MICROSCOPIC
Bilirubin Urine: NEGATIVE
Glucose, UA: NEGATIVE mg/dL
Ketones, ur: NEGATIVE mg/dL
Nitrite: NEGATIVE
Protein, ur: 30 mg/dL — AB
Specific Gravity, Urine: 1.004 — ABNORMAL LOW (ref 1.005–1.030)
pH: 5 (ref 5.0–8.0)

## 2022-06-03 LAB — TYPE AND SCREEN
ABO/RH(D): O POS
Antibody Screen: NEGATIVE

## 2022-06-03 LAB — CBC WITH DIFFERENTIAL/PLATELET
Abs Immature Granulocytes: 0.03 10*3/uL (ref 0.00–0.07)
Basophils Absolute: 0 10*3/uL (ref 0.0–0.1)
Basophils Relative: 0 %
Eosinophils Absolute: 0.2 10*3/uL (ref 0.0–0.5)
Eosinophils Relative: 2 %
HCT: 33 % — ABNORMAL LOW (ref 36.0–46.0)
Hemoglobin: 10.4 g/dL — ABNORMAL LOW (ref 12.0–15.0)
Immature Granulocytes: 0 %
Lymphocytes Relative: 23 %
Lymphs Abs: 2.4 10*3/uL (ref 0.7–4.0)
MCH: 25.5 pg — ABNORMAL LOW (ref 26.0–34.0)
MCHC: 31.5 g/dL (ref 30.0–36.0)
MCV: 80.9 fL (ref 80.0–100.0)
Monocytes Absolute: 0.6 10*3/uL (ref 0.1–1.0)
Monocytes Relative: 6 %
Neutro Abs: 7.3 10*3/uL (ref 1.7–7.7)
Neutrophils Relative %: 69 %
Platelets: 345 10*3/uL (ref 150–400)
RBC: 4.08 MIL/uL (ref 3.87–5.11)
RDW: 15.9 % — ABNORMAL HIGH (ref 11.5–15.5)
WBC: 10.6 10*3/uL — ABNORMAL HIGH (ref 4.0–10.5)
nRBC: 0 % (ref 0.0–0.2)

## 2022-06-03 LAB — POC URINE PREG, ED: Preg Test, Ur: NEGATIVE

## 2022-06-03 LAB — LIPASE, BLOOD: Lipase: 40 U/L (ref 11–51)

## 2022-06-03 MED ORDER — IOHEXOL 350 MG/ML SOLN
75.0000 mL | Freq: Once | INTRAVENOUS | Status: AC | PRN
  Administered 2022-06-03: 75 mL via INTRAVENOUS

## 2022-06-03 MED ORDER — CEPHALEXIN 250 MG PO CAPS
500.0000 mg | ORAL_CAPSULE | Freq: Once | ORAL | Status: AC
  Administered 2022-06-03: 500 mg via ORAL
  Filled 2022-06-03: qty 2

## 2022-06-03 MED ORDER — CEPHALEXIN 500 MG PO CAPS: 500.0000 mg | ORAL_CAPSULE | Freq: Four times a day (QID) | ORAL | 0 refills | Status: DC

## 2022-06-03 MED ORDER — ACETAMINOPHEN 500 MG PO TABS
1000.0000 mg | ORAL_TABLET | Freq: Once | ORAL | Status: DC
  Filled 2022-06-03: qty 2

## 2022-06-03 NOTE — Discharge Instructions (Addendum)
It was our pleasure to provide your ER care today - we hope that you feel better.  Drink plenty of fluids/stay well hydrated. Take acetaminophen or ibuprofen as need. Take keflex (antibiotic) as prescribed for possible urine infection.   Your CT scan made note of 1. The uterus is enlarged and diffusely heterogeneous. Findings may be related to fibroids or adenomyosis. 2. 3.2 cm hypodense area in the right ovary, possibly a hemorrhagic cyst. 3. Fatty infiltration of the liver. 4. Small fat containing umbilical hernia.   Follow up closely with your ob/gyn doctor in the coming week - discuss possible pelvic ultrasound then - call office tomorrow AM to arrange appointment.   Return to ER if worse, new symptoms, fevers, severe or worsening abdominal pain, persistent vomiting, heavy/persistent vaginal bleeding, weak/fainting, or other concern.

## 2022-06-03 NOTE — ED Triage Notes (Addendum)
Pt c/o vaginal bleeding x 5 days, heavy with clots; states period has been over for 10 days; started when she started taking ibuprofen and Advil for tooth pain; c/o L flank pain; changing pads every 4-5 hours; state she went to obgyn 3 days ago, was told to "let it heal by itself"

## 2022-06-03 NOTE — ED Provider Triage Note (Signed)
Emergency Medicine Provider Triage Evaluation Note  Briana Fuentes , a 47 y.o. female  was evaluated in triage.  Pt complains of vaginal bleeding. Persistent vaginal bleeding x 5 days, now passing large clots and having abd pain.  No dizzy or lightheadedness.  Seen by OBGYN 3 days ago and was told to wait it out.  Have been taking ibuprofen for dental pain  Review of Systems  Positive: As above Negative: As above  Physical Exam  BP 122/86 (BP Location: Left Arm)   Pulse 73   Temp 98.2 F (36.8 C) (Oral)   Resp 16   SpO2 99%  Gen:   Awake, no distress   Resp:  Normal effort  MSK:   Moves extremities without difficulty  Other:    Medical Decision Making  Medically screening exam initiated at 2:18 PM.  Appropriate orders placed.  Briana Fuentes was informed that the remainder of the evaluation will be completed by another provider, this initial triage assessment does not replace that evaluation, and the importance of remaining in the ED until their evaluation is complete.     Fayrene Helper, PA-C 06/03/22 1421

## 2022-06-03 NOTE — ED Provider Notes (Signed)
[facility] AT [facility] Provider Note   CSN: [id number] Arrival date & time: 06/03/22  1310     History  Chief Complaint  Patient presents with   Vaginal Bleeding    Briana Fuentes is a 46 y.o. female.  Pt c/o generalized abd pain/cramping in past week. Indicates saw ob/gyn doctor a couple days ago as had normal period 10 days ago and then return of some vaginal bleeding/spotting in the past few days. Pt indicates was told then no acute ob/gyn issue and with abd pain to see pcp for possible non-gyn cause of pain. Pt denies hx dysfunctional uterine bleeding. No bcp or hormone replacement therapy use. No hx cysts or fibroids. No unusual vaginal discharge. No dysuria or hematuria. No back/flank pain. No blood in stool, rectal bleeding or melena. No faintness or dizziness. No fever or chills. Indicates using one pad q 4-5 hours or so.   The history is provided by the patient and medical records.  Vaginal Bleeding Associated symptoms: no abdominal pain, no back pain, no dysuria, no fever and no vaginal discharge        Home Medications Prior to Admission medications   Medication Sig Start Date End Date Taking? Authorizing Provider  FeFum-FePoly-FA-B Cmp-C-Biot (INTEGRA PLUS) CAPS Take 1 capsule by mouth daily. 11/30/21   Si Gaul, MD  levothyroxine (SYNTHROID) 125 MCG tablet levothyroxine 125 mcg tablet  TAKE 1 TABLET BY MOUTH EVERY MORNING ON AN EMPTY STOMACH    [provider]  metFORMIN (GLUCOPHAGE-XR) 500 MG 24 hr tablet TAKE 1 TABLET BY MOUTH TWICE A DAY WITH MEALS 12/31/12   Constant, [name], MD  pregabalin (LYRICA) 100 MG capsule Take 150 mg by mouth 2 (two) times daily.     [provider]      Allergies    Patient has no known allergies.    Review of Systems   Review of Systems  Constitutional:  Negative for chills and fever.  HENT:  Negative for nosebleeds.   Respiratory:  Negative for cough and shortness of breath.    Cardiovascular:  Negative for chest pain and leg swelling.  Gastrointestinal:  Negative for abdominal pain, blood in stool and vomiting.  Genitourinary:  Positive for vaginal bleeding. Negative for dysuria, flank pain and vaginal discharge.  Musculoskeletal:  Negative for back pain and neck pain.  Skin:  Negative for rash.  Neurological:  Negative for light-headedness and headaches.  Hematological:  Does not bruise/bleed easily.    Physical Exam Updated Vital Signs BP 119/77 (BP Location: Left Arm)   Pulse 71   Temp 97.7 F (36.5 C) (Oral)   Resp 16   SpO2 98%  Physical Exam Vitals and nursing note reviewed.  Constitutional:      Appearance: Normal appearance. She is well-developed.  HENT:     Head: Atraumatic.     Nose: Nose normal.     Mouth/Throat:     Mouth: Mucous membranes are moist.  Eyes:     General: No scleral icterus.    Conjunctiva/sclera: Conjunctivae normal.  Neck:     Trachea: No tracheal deviation.  Cardiovascular:     Rate and Rhythm: Normal rate and regular rhythm.     Pulses: Normal pulses.     Heart sounds: Normal heart sounds. No murmur heard.    No friction rub. No gallop.  Pulmonary:     Effort: Pulmonary effort is normal. No respiratory distress.     Breath sounds: Normal breath sounds.  Abdominal:     General: Bowel sounds are normal. There is no distension.     Palpations: Abdomen is soft. There is no mass.     Tenderness: There is no abdominal tenderness.  Genitourinary:    Comments: No cva tenderness.  Musculoskeletal:        General: No swelling.     Cervical back: Normal range of motion and neck supple. No rigidity. No muscular tenderness.  Skin:    General: Skin is warm and dry.     Coloration: Skin is not pale.     Findings: No rash.  Neurological:     Mental Status: She is alert.     Comments: Alert, speech normal.   Psychiatric:        Mood and Affect: Mood normal.     ED Results / Procedures / Treatments   Labs (all  labs ordered are listed, but only abnormal results are displayed) Results for orders placed or performed during the hospital encounter of 06/03/22  CBC with Differential  Result Value Ref Range   WBC 10.6 (H) 4.0 - 10.5 K/uL   RBC 4.08 3.87 - 5.11 MIL/uL   Hemoglobin 10.4 (L) 12.0 - 15.0 g/dL   HCT 82.933.0 (L) 56.236.0 - 13.046.0 %   MCV 80.9 80.0 - 100.0 fL   MCH 25.5 (L) 26.0 - 34.0 pg   MCHC 31.5 30.0 - 36.0 g/dL   RDW 86.515.9 (H) 78.411.5 - 69.615.5 %   Platelets 345 150 - 400 K/uL   nRBC 0.0 0.0 - 0.2 %   Neutrophils Relative % 69 %   Neutro Abs 7.3 1.7 - 7.7 K/uL   Lymphocytes Relative 23 %   Lymphs Abs 2.4 0.7 - 4.0 K/uL   Monocytes Relative 6 %   Monocytes Absolute 0.6 0.1 - 1.0 K/uL   Eosinophils Relative 2 %   Eosinophils Absolute 0.2 0.0 - 0.5 K/uL   Basophils Relative 0 %   Basophils Absolute 0.0 0.0 - 0.1 K/uL   Immature Granulocytes 0 %   Abs Immature Granulocytes 0.03 0.00 - 0.07 K/uL  Comprehensive metabolic panel  Result Value Ref Range   Sodium 134 (L) 135 - 145 mmol/L   Potassium 4.1 3.5 - 5.1 mmol/L   Chloride 98 98 - 111 mmol/L   CO2 23 22 - 32 mmol/L   Glucose, Bld 136 (H) 70 - 99 mg/dL   BUN 11 6 - 20 mg/dL   Creatinine, Ser 2.950.68 0.44 - 1.00 mg/dL   Calcium 9.3 8.9 - 28.410.3 mg/dL   Total Protein 8.5 (H) 6.5 - 8.1 g/dL   Albumin 3.7 3.5 - 5.0 g/dL   AST 16 15 - 41 U/L   ALT 19 0 - 44 U/L   Alkaline Phosphatase 58 38 - 126 U/L   Total Bilirubin 0.5 0.3 - 1.2 mg/dL   GFR, Estimated >13>60 >24>60 mL/min   Anion gap 13 5 - 15  Lipase, blood  Result Value Ref Range   Lipase 40 11 - 51 U/L  Urinalysis, Routine w reflex microscopic -Urine, Clean Catch  Result Value Ref Range   Color, Urine YELLOW YELLOW   APPearance CLOUDY (A) CLEAR   Specific Gravity, Urine 1.004 (L) 1.005 - 1.030   pH 5.0 5.0 - 8.0   Glucose, UA NEGATIVE NEGATIVE mg/dL   Hgb urine dipstick LARGE (A) NEGATIVE   Bilirubin Urine NEGATIVE NEGATIVE   Ketones, ur NEGATIVE NEGATIVE mg/dL   Protein, ur 30 (A)  NEGATIVE mg/dL  Nitrite NEGATIVE NEGATIVE   Leukocytes,Ua LARGE (A) NEGATIVE   RBC / HPF 6-10 0 - 5 RBC/hpf   WBC, UA 21-50 0 - 5 WBC/hpf   Bacteria, UA FEW (A) NONE SEEN   Squamous Epithelial / HPF 0-5 0 - 5 /HPF   Mucus PRESENT    Budding Yeast PRESENT   POC urine preg, ED  Result Value Ref Range   Preg Test, Ur NEGATIVE NEGATIVE  Type and screen [facility]  Result Value Ref Range   ABO/RH(D) O POS    Antibody Screen NEG    Sample Expiration      06/06/2022,2359 Performed at [facility], [address], [place], [place] [address]       EKG None  Radiology CT Abdomen Pelvis W Contrast  Result Date: 06/03/2022 CLINICAL DATA:  Vaginal bleeding and abdominal pain. Left flank pain. EXAM: CT ABDOMEN AND PELVIS WITH CONTRAST TECHNIQUE: Multidetector CT imaging of the abdomen and pelvis was performed using the standard protocol following bolus administration of intravenous contrast. RADIATION DOSE REDUCTION: This exam was performed according to the departmental dose-optimization program which includes automated exposure control, adjustment of the mA and/or kV according to patient size and/or use of iterative reconstruction technique. CONTRAST:  20mL OMNIPAQUE IOHEXOL 350 MG/ML SOLN COMPARISON:  None. FINDINGS: Lower chest: No acute abnormality. Hepatobiliary: The liver is enlarged with mild diffuse fatty infiltration. Gallbladder and bile ducts are within normal limits. Pancreas: Unremarkable. No pancreatic ductal dilatation or surrounding inflammatory changes. Spleen: Normal in size without focal abnormality. Adrenals/Urinary Tract: Adrenal glands are unremarkable. Kidneys are normal, without renal calculi, focal lesion, or hydronephrosis. Bladder is unremarkable. Stomach/Bowel: Stomach is within normal limits. Appendix appears normal. No evidence of bowel wall thickening, distention, or inflammatory changes. Vascular/Lymphatic: No significant vascular findings are  present. No enlarged abdominal or pelvic lymph nodes. Reproductive: The uterus is enlarged and diffusely heterogeneous measuring 11.6 x 12.8 x 9.6 cm. Left ovary is grossly within normal limits. There is a 3.2 cm hypodense area measuring 34 Hounsfield units in the right ovary, possibly a hemorrhagic cyst. Other: Small fat containing umbilical hernia.  No ascites. Musculoskeletal: No acute or significant osseous findings. IMPRESSION: 1. The uterus is enlarged and diffusely heterogeneous. Findings may be related to fibroids or adenomyosis. Recommend further evaluation with pelvic ultrasound. 2. 3.2 cm hypodense area in the right ovary, possibly a hemorrhagic cyst. This can be further evaluated with pelvic ultrasound. 3. Hepatomegaly with fatty infiltration of the liver. 4. Small fat containing umbilical hernia. Electronically Signed   By: Darliss Cheney M.D.   On: 06/03/2022 22:43    Procedures Procedures    Medications Ordered in ED Medications  iohexol (OMNIPAQUE) 350 MG/ML injection 75 mL (75 mLs Intravenous Contrast Given 06/03/22 2227)    ED Course/ Medical Decision Making/ A&P                             Medical Decision Making Problems Addressed: Acute UTI: acute illness or injury Dysfunctional uterine bleeding: acute illness or injury with systemic symptoms Right ovarian cyst: acute illness or injury Umbilical hernia without obstruction and without gangrene: chronic illness or injury Uterine leiomyoma, unspecified location: acute illness or injury  Amount and/or Complexity of Data Reviewed External Data Reviewed: labs and notes. Labs: ordered. Decision-making details documented in ED Course. Radiology: ordered and independent interpretation performed. Decision-making details documented in ED Course.  Risk Prescription drug management. Decision  regarding hospitalization.   Iv ns. Continuous pulse ox and cardiac monitoring. Labs ordered/sent. Imaging ordered.   Differential  diagnosis includes dysfunctional uterine bleeding, acute abd process, uti/pyelo, etc. Dispo decision including potential need for admission considered - will get labs and imaging and reassess.   Reviewed nursing notes and prior charts for additional history. External reports reviewed.  Pt indicates saw ob/gyn for same in past couple days and had pelvic exam then.   Cardiac monitor: sinus rhythm, rate 71.  Labs reviewed/interpreted by me - hct 33. Possible uti w LE pos and many wbc. Keflex po.  Preg neg.   CT reviewed/interpreted by me - fibroid, ovarian cyst.   Acetaminophen po.   Recheck pt, no recurrent bleeding during ED stay. Pt comfortable. No nv. Abd soft nt. No current pain or tenderness. Vitals normal.  Pt currently appears stable for ED d/c. Rec close ob/gyn f/u, possible outpatient u/s.   Will give rx keflex for possible uti. Acetaminophen, ibuprofen prn.   Rec close pcp f/u.  Return precautions provided.          Final Clinical Impression(s) / ED Diagnoses Final diagnoses:  None    Rx / DC Orders ED Discharge Orders     None         Cathren Laine, MD 06/03/22 2309

## 2022-06-03 NOTE — ED Notes (Signed)
Discharge instructions discussed with pt. Verbalized understanding. VSS. No questions or concerns regarding discharge  

## 2022-06-05 ENCOUNTER — Emergency Department (HOSPITAL_COMMUNITY)
Admission: EM | Admit: 2022-06-05 | Discharge: 2022-06-06 | Disposition: A | Discharge status: Home / Self Care | Payer: 59 | Attending: Emergency Medicine | Admitting: Emergency Medicine

## 2022-06-05 ENCOUNTER — Other Ambulatory Visit: Payer: Self-pay

## 2022-06-05 ENCOUNTER — Encounter (HOSPITAL_COMMUNITY): Payer: Self-pay

## 2022-06-05 DIAGNOSIS — Z79899 Other long term (current) drug therapy: Secondary | ICD-10-CM | POA: Insufficient documentation

## 2022-06-05 DIAGNOSIS — E119 Type 2 diabetes mellitus without complications: Secondary | ICD-10-CM | POA: Diagnosis not present

## 2022-06-05 DIAGNOSIS — R1013 Epigastric pain: Secondary | ICD-10-CM | POA: Insufficient documentation

## 2022-06-05 DIAGNOSIS — E039 Hypothyroidism, unspecified: Secondary | ICD-10-CM | POA: Insufficient documentation

## 2022-06-05 DIAGNOSIS — R58 Hemorrhage, not elsewhere classified: Secondary | ICD-10-CM | POA: Diagnosis not present

## 2022-06-05 DIAGNOSIS — N939 Abnormal uterine and vaginal bleeding, unspecified: Secondary | ICD-10-CM | POA: Diagnosis present

## 2022-06-05 DIAGNOSIS — N938 Other specified abnormal uterine and vaginal bleeding: Secondary | ICD-10-CM | POA: Diagnosis not present

## 2022-06-05 DIAGNOSIS — R001 Bradycardia, unspecified: Secondary | ICD-10-CM | POA: Diagnosis not present

## 2022-06-05 DIAGNOSIS — R42 Dizziness and giddiness: Secondary | ICD-10-CM | POA: Diagnosis not present

## 2022-06-05 DIAGNOSIS — Z7984 Long term (current) use of oral hypoglycemic drugs: Secondary | ICD-10-CM | POA: Insufficient documentation

## 2022-06-05 DIAGNOSIS — I10 Essential (primary) hypertension: Secondary | ICD-10-CM | POA: Diagnosis not present

## 2022-06-05 DIAGNOSIS — R1032 Left lower quadrant pain: Secondary | ICD-10-CM | POA: Diagnosis not present

## 2022-06-05 NOTE — ED Provider Notes (Signed)
[place] EMERGENCY DEPARTMENT AT [facility] Provider Note   CSN: [id number] Arrival date & time: 06/05/22  2206     History {Add pertinent medical, surgical, social history, OB history to HPI:1} Chief Complaint  Patient presents with   Vaginal Bleeding    Briana Fuentes is a 47 y.o. female.  HPI   Patient with medical history including hypothyroidism, diabetes, menorrhea, chronic anemia, fibroids presenting with complaints of abdominal distention and vaginal bleeding.  Patient states that she has been having vaginal bleeding for the last 15 days, states that she had 10 days of bleeding and then stop provide A's and then came back on, when he came back on she started to have some abdominal cramping/bloating, pain is intermittent, will have occasional pain in her epigastric/ left lower quadrant, no associated pelvic pain, no vaginal discharge, no urinary frequency or dysuria, she does admit to hematuria.  Patient has had a hysterectomy, no other significant abdominal surgeries, states she used to have heavy menstrual cycles but this resolved about 6 months ago, states that she typically gets 1 menstrual cycle a month, she is currently not on any hormone medication, no history ovarian torsion's ectopic pregnancies.  Reviewed patient's chart followed by oncology for iron deficiency anemia from menorrhagia, she was seen in the emergency department on 04/11 for similar presentation, hemoglobin was at baseline, CMP unremarkable, UA concerning for possible UTI, was started on antibiotics, CT and pelvis was obtained showing likely fibroids and adenomas recommend ultrasound for further evaluation, there is also possible right ovarian cyst.    Home Medications Prior to Admission medications   Medication Sig Start Date End Date Taking? Authorizing Provider  cephALEXin (KEFLEX) 500 MG capsule Take 1 capsule (500 mg total) by mouth 4 (four) times daily. 06/03/22   Cathren Laine, MD   FeFum-FePoly-FA-B Cmp-C-Biot (INTEGRA PLUS) CAPS Take 1 capsule by mouth daily. 11/30/21   Si Gaul, MD  levothyroxine (SYNTHROID) 125 MCG tablet levothyroxine 125 mcg tablet  TAKE 1 TABLET BY MOUTH EVERY MORNING ON AN EMPTY STOMACH    [provider]  metFORMIN (GLUCOPHAGE-XR) 500 MG 24 hr tablet TAKE 1 TABLET BY MOUTH TWICE A DAY WITH MEALS 12/31/12   Constant, [name], MD  pregabalin (LYRICA) 100 MG capsule Take 150 mg by mouth 2 (two) times daily.     [provider]      Allergies    Patient has no known allergies.    Review of Systems   Review of Systems  Constitutional:  Negative for chills and fever.  Respiratory:  Negative for shortness of breath.   Cardiovascular:  Negative for chest pain.  Gastrointestinal:  Positive for abdominal pain. Negative for nausea and vomiting.  Genitourinary:  Positive for vaginal bleeding.  Neurological:  Negative for headaches.    Physical Exam Updated Vital Signs BP 132/84   Pulse 79   Temp 97.7 F (36.5 C)   Resp 16   Ht  (1.651 m)   Wt 63.5 kg   SpO2 99%   BMI 23.30 kg/m  Physical Exam Vitals and nursing note reviewed.  Constitutional:      General: She is not in acute distress.    Appearance: She is not ill-appearing.  HENT:     Head: Normocephalic and atraumatic.     Nose: No congestion.  Eyes:     Conjunctiva/sclera: Conjunctivae normal.  Cardiovascular:     Rate and Rhythm: Normal rate and regular rhythm.     Pulses: Normal  pulses.     Heart sounds: No murmur heard.    No friction rub. No gallop.  Pulmonary:     Effort: No respiratory distress.     Breath sounds: No wheezing, rhonchi or rales.  Abdominal:     Palpations: Abdomen is soft.     Tenderness: There is no abdominal tenderness. There is no right CVA tenderness or left CVA tenderness.     Comments: Abdomen nondistended, soft, she is nontender to palpation.  Musculoskeletal:     Right lower leg: No edema.     Left lower leg: No  edema.  Skin:    General: Skin is warm and dry.  Neurological:     Mental Status: She is alert.  Psychiatric:        Mood and Affect: Mood normal.     ED Results / Procedures / Treatments   Labs (all labs ordered are listed, but only abnormal results are displayed) Labs Reviewed  WET PREP, GENITAL  COMPREHENSIVE METABOLIC PANEL  LIPASE, BLOOD  CBC WITH DIFFERENTIAL/PLATELET  URINALYSIS, ROUTINE W REFLEX MICROSCOPIC  GC/CHLAMYDIA PROBE AMP ([place]) NOT AT [facility]    EKG None  Radiology No results found.  Procedures Procedures  {Document cardiac monitor, telemetry assessment procedure when appropriate:1}  Medications Ordered in ED Medications - No data to display  ED Course/ Medical Decision Making/ A&P   {   Click here for ABCD2, HEART and other calculatorsREFRESH Note before signing :1}                          Medical Decision Making Amount and/or Complexity of Data Reviewed Labs: ordered.   This patient presents to the ED for concern of vaginal bleeding, this involves an extensive number of treatment options, and is a complaint that carries with it a high risk of complications and morbidity.  The differential diagnosis includes hemorrhagic cyst, uterine fibroids, ovarian torsion, GI bleed    Additional history obtained:  Additional history obtained from N/A External records from outside source obtained and reviewed including oncology notes, OB/GYN notes,   Co morbidities that complicate the patient evaluation  Menorrhea, uterine fibroids  Social Determinants of Health:  N/A    Lab Tests:  I Ordered, and personally interpreted labs.  The pertinent results include:  ***   Imaging Studies ordered:  I ordered imaging studies including transvaginal ultrasound I independently visualized and interpreted imaging which showed *** I agree with the radiologist interpretation   Cardiac Monitoring:  The patient was maintained on a cardiac monitor.   I personally viewed and interpreted the cardiac monitored which showed an underlying rhythm of: ***   Medicines ordered and prescription drug management:  I ordered medication including ***  for ***  I have reviewed the patients home medicines and have made adjustments as needed  Critical Interventions:  ***   Reevaluation:  Presenting with abdominal bloating vaginal bleeding, suspicion is dysfunctional uterine bleeding likely from uterine fibroids will obtain basic lab workup to assess for blood loss, likely further assess with transvaginal ultrasound.  Consultations Obtained:  I requested consultation with the ***,  and discussed lab and imaging findings as well as pertinent plan - they recommend: ***    Test Considered:  ***    Rule out ****    Dispostion and problem list  After consideration of the diagnostic results and the patients response to treatment, I feel that the patent would benefit from ***.       {  Document critical care time when appropriate:1} {Document review of labs and clinical decision tools ie heart score, Chads2Vasc2 etc:1}  {Document your independent review of radiology images, and any outside records:1} {Document your discussion with family members, caretakers, and with consultants:1} {Document social determinants of health affecting pt's care:1} {Document your decision making why or why not admission, treatments were needed:1} Final Clinical Impression(s) / ED Diagnoses Final diagnoses:  None    Rx / DC Orders ED Discharge Orders     None

## 2022-06-05 NOTE — ED Triage Notes (Signed)
Patient arrives from home with [facility] with c/o vaginal bleeding that started 15 days ago.  She was seen on 4/11 for same and dx with dysfunctional uterine bleeding.  She was supposed to f/u on 4/18 but bleeding is worse today, she went through 10 pads and reports large clots

## 2022-06-06 ENCOUNTER — Emergency Department (HOSPITAL_COMMUNITY): Payer: 59

## 2022-06-06 LAB — COMPREHENSIVE METABOLIC PANEL
ALT: 14 U/L (ref 0–44)
AST: 18 U/L (ref 15–41)
Albumin: 3.5 g/dL (ref 3.5–5.0)
Alkaline Phosphatase: 53 U/L (ref 38–126)
Anion gap: 10 (ref 5–15)
BUN: 10 mg/dL (ref 6–20)
CO2: 21 mmol/L — ABNORMAL LOW (ref 22–32)
Calcium: 8.5 mg/dL — ABNORMAL LOW (ref 8.9–10.3)
Chloride: 104 mmol/L (ref 98–111)
Creatinine, Ser: 0.67 mg/dL (ref 0.44–1.00)
GFR, Estimated: >60 mL/min (ref >60)
Glucose, Bld: 153 mg/dL — ABNORMAL HIGH (ref 70–99)
Potassium: 3.4 mmol/L — ABNORMAL LOW (ref 3.5–5.1)
Sodium: 135 mmol/L (ref 135–145)
Total Bilirubin: 0.3 mg/dL (ref 0.3–1.2)
Total Protein: 6.8 g/dL (ref 6.5–8.1)

## 2022-06-06 LAB — CBC WITH DIFFERENTIAL/PLATELET
Abs Immature Granulocytes: 0.06 10*3/uL (ref 0.00–0.07)
Basophils Absolute: 0.1 10*3/uL (ref 0.0–0.1)
Basophils Relative: 1 %
Eosinophils Absolute: 0.2 10*3/uL (ref 0.0–0.5)
Eosinophils Relative: 2 %
HCT: 29.4 % — ABNORMAL LOW (ref 36.0–46.0)
Hemoglobin: 9.2 g/dL — ABNORMAL LOW (ref 12.0–15.0)
Immature Granulocytes: 1 %
Lymphocytes Relative: 31 %
Lymphs Abs: 3.1 10*3/uL (ref 0.7–4.0)
MCH: 26.1 pg (ref 26.0–34.0)
MCHC: 31.3 g/dL (ref 30.0–36.0)
MCV: 83.5 fL (ref 80.0–100.0)
Monocytes Absolute: 0.7 10*3/uL (ref 0.1–1.0)
Monocytes Relative: 7 %
Neutro Abs: 6.2 10*3/uL (ref 1.7–7.7)
Neutrophils Relative %: 58 %
Platelets: 290 10*3/uL (ref 150–400)
RBC: 3.52 MIL/uL — ABNORMAL LOW (ref 3.87–5.11)
RDW: 15.9 % — ABNORMAL HIGH (ref 11.5–15.5)
WBC: 10.3 10*3/uL (ref 4.0–10.5)
nRBC: 0 % (ref 0.0–0.2)

## 2022-06-06 LAB — WET PREP, GENITAL
Clue Cells Wet Prep HPF POC: NONE SEEN
Sperm: NONE SEEN
Trich, Wet Prep: NONE SEEN
WBC, Wet Prep HPF POC: <10 (ref <10)
Yeast Wet Prep HPF POC: NONE SEEN

## 2022-06-06 LAB — LIPASE, BLOOD: Lipase: 36 U/L (ref 11–51)

## 2022-06-06 MED ORDER — MEGESTROL ACETATE 40 MG PO TABS: 40.0000 mg | ORAL_TABLET | Freq: Every day | ORAL | 0 refills | Status: AC

## 2022-06-06 NOTE — Discharge Instructions (Signed)
Ultrasound shows that you have uterine fibroids, I suspect this is likely cause of your abnormal vaginal bleeding.  I have given you medication to hopefully help slow down the vaginal bleeding.  It Is very importantly follow-up with OB/GYN for further evaluation.  Come back to the emergency department if you develop chest pain, shortness of breath, severe abdominal pain, uncontrolled nausea, vomiting, diarrhea.

## 2022-06-08 LAB — GC/CHLAMYDIA PROBE AMP (~~LOC~~) NOT AT ARMC
Chlamydia: NEGATIVE
Comment: NEGATIVE
Comment: NORMAL
Neisseria Gonorrhea: NEGATIVE

## 2022-06-09 DIAGNOSIS — N939 Abnormal uterine and vaginal bleeding, unspecified: Secondary | ICD-10-CM | POA: Diagnosis not present

## 2022-06-18 ENCOUNTER — Telehealth: Payer: Self-pay | Admitting: Hematology

## 2022-06-18 NOTE — Telephone Encounter (Signed)
Contacted patient to scheduled appointments. Patient is aware of appointments that are scheduled.   

## 2022-06-21 ENCOUNTER — Other Ambulatory Visit: Payer: Self-pay | Admitting: Medical Oncology

## 2022-06-21 DIAGNOSIS — D500 Iron deficiency anemia secondary to blood loss (chronic): Secondary | ICD-10-CM

## 2022-06-22 ENCOUNTER — Inpatient Hospital Stay: Payer: 59 | Attending: Internal Medicine

## 2022-06-22 ENCOUNTER — Inpatient Hospital Stay (HOSPITAL_BASED_OUTPATIENT_CLINIC_OR_DEPARTMENT_OTHER): Payer: 59 | Admitting: Internal Medicine

## 2022-06-22 ENCOUNTER — Other Ambulatory Visit: Payer: Self-pay

## 2022-06-22 VITALS — BP 114/79 | HR 79 | Temp 97.9°F | Resp 15 | Wt 141.4 lb

## 2022-06-22 DIAGNOSIS — D500 Iron deficiency anemia secondary to blood loss (chronic): Secondary | ICD-10-CM

## 2022-06-22 DIAGNOSIS — N920 Excessive and frequent menstruation with regular cycle: Secondary | ICD-10-CM | POA: Insufficient documentation

## 2022-06-22 LAB — CBC WITH DIFFERENTIAL (CANCER CENTER ONLY)
Abs Immature Granulocytes: 0.04 10*3/uL (ref 0.00–0.07)
Basophils Absolute: 0.1 10*3/uL (ref 0.0–0.1)
Basophils Relative: 1 %
Eosinophils Absolute: 0.2 10*3/uL (ref 0.0–0.5)
Eosinophils Relative: 3 %
HCT: 27.5 % — ABNORMAL LOW (ref 36.0–46.0)
Hemoglobin: 8.5 g/dL — ABNORMAL LOW (ref 12.0–15.0)
Immature Granulocytes: 0 %
Lymphocytes Relative: 28 %
Lymphs Abs: 2.7 10*3/uL (ref 0.7–4.0)
MCH: 25.2 pg — ABNORMAL LOW (ref 26.0–34.0)
MCHC: 30.9 g/dL (ref 30.0–36.0)
MCV: 81.6 fL (ref 80.0–100.0)
Monocytes Absolute: 0.5 10*3/uL (ref 0.1–1.0)
Monocytes Relative: 5 %
Neutro Abs: 6.1 10*3/uL (ref 1.7–7.7)
Neutrophils Relative %: 63 %
Platelet Count: 445 10*3/uL — ABNORMAL HIGH (ref 150–400)
RBC: 3.37 MIL/uL — ABNORMAL LOW (ref 3.87–5.11)
RDW: 15.2 % (ref 11.5–15.5)
WBC Count: 9.7 10*3/uL (ref 4.0–10.5)
nRBC: 0 % (ref 0.0–0.2)

## 2022-06-22 LAB — IRON AND IRON BINDING CAPACITY (CC-WL,HP ONLY)
Iron: 20 ug/dL — ABNORMAL LOW (ref 28–170)
Saturation Ratios: 4 % — ABNORMAL LOW (ref 10.4–31.8)
TIBC: 473 ug/dL — ABNORMAL HIGH (ref 250–450)
UIBC: 453 ug/dL — ABNORMAL HIGH (ref 148–442)

## 2022-06-22 LAB — FERRITIN: Ferritin: 5 ng/mL — ABNORMAL LOW (ref 11–307)

## 2022-06-22 LAB — SAMPLE TO BLOOD BANK

## 2022-06-22 NOTE — Progress Notes (Signed)
[facility] [phone]   [phone]  OFFICE PROGRESS NOTE  Briana, [facility] [address], [address] [place] [address]  DIAGNOSIS: severe iron deficiency anemia secondary to chronic gynecologic blood loss.  PRIOR THERAPY:  1) Iron infusion with Feraheme 510 mg IV weekly for 2 doses.  Last dose was given on May 04, 2020. 2) iron infusion with Venofer 300 Mg IV weekly for 3 weeks last dose was given August 31, 2021  CURRENT THERAPY: Oral iron tablet with Integra +1 capsule p.o. daily  INTERVAL HISTORY: Briana Fuentes 47 y.o. female returns to the clinic today for follow-up visit accompanied by her husband.  The patient was supposed to have follow-up visit 3 months ago but she missed her appointment.  She has been complaining of heavy menstrual period with increasing fatigue and weakness.  She has dizzy spells as well as restless leg and hair loss.  She denied having any fever or chills.  She has shortness of breath with exertion but no significant chest pain, cough or hemoptysis.  She has no recent weight loss or night sweats.  She is here today for evaluation and repeat blood work.   MEDICAL HISTORY: Past Medical History:  Diagnosis Date   Diabetes mellitus without complication (HCC)    Gestational diabetes   Gout    Hypothyroidism    Infertility, female    Neuropathy     ALLERGIES:  has No Known Allergies.  MEDICATIONS:  Current Outpatient Medications  Medication Sig Dispense Refill   cephALEXin (KEFLEX) 500 MG capsule Take 1 capsule (500 mg total) by mouth 4 (four) times daily. 20 capsule 0   FeFum-FePoly-FA-B Cmp-C-Biot (INTEGRA PLUS) CAPS Take 1 capsule by mouth daily. (Patient not taking: Reported on 06/06/2022) 30 capsule 3   levothyroxine (SYNTHROID) 125 MCG tablet Take 125 mcg by mouth daily before breakfast.     metFORMIN (GLUCOPHAGE-XR) 500 MG 24 hr tablet TAKE 1 TABLET BY MOUTH TWICE A DAY WITH MEALS  (Patient taking differently: Take 500-1,000 mg by mouth in the morning and at bedtime. Take 2 tablets (1000 mg) in the morning and Take 1 tablet (500 mg) at bedtime) 60 tablet 0   pregabalin (LYRICA) 100 MG capsule Take 150 mg by mouth daily.     No current facility-administered medications for this visit.    SURGICAL HISTORY:  Past Surgical History:  Procedure Laterality Date   CESAREAN SECTION N/A 08/10/2012   Procedure: CESAREAN SECTION;  Surgeon: Catalina Antigua, MD;  Location: [facility];  Service: Obstetrics;  Laterality: N/A;  Primary Cesarean Section Delivery Baby Girl @ 2126, Apgars 9/9   THYROIDECTOMY      REVIEW OF SYSTEMS:  A comprehensive review of systems was negative except for: Constitutional: positive for fatigue Respiratory: positive for dyspnea on exertion Neurological: positive for dizziness   PHYSICAL EXAMINATION: General appearance: alert, cooperative, fatigued, and no distress Head: Normocephalic, without obvious abnormality, atraumatic Neck: no adenopathy, no JVD, supple, symmetrical, trachea midline, and thyroid not enlarged, symmetric, no tenderness/mass/nodules Lymph nodes: Cervical, supraclavicular, and axillary nodes normal. Resp: clear to auscultation bilaterally Back: symmetric, no curvature. ROM normal. No CVA tenderness. Cardio: regular rate and rhythm, S1, S2 normal, no murmur, click, rub or gallop GI: soft, non-tender; bowel sounds normal; no masses,  no organomegaly Extremities: extremities normal, atraumatic, no cyanosis or edema  ECOG PERFORMANCE STATUS: 1 - Symptomatic but completely ambulatory  Blood pressure 114/79, pulse 79, temperature 97.9 F (  36.6 C), temperature source Temporal, resp. rate 15, weight 141 lb 6.4 oz (64.1 kg), SpO2 100 %.  LABORATORY DATA: Lab Results  Component Value Date   WBC 9.7 06/22/2022   HGB 8.5 (L) 06/22/2022   HCT 27.5 (L) 06/22/2022   MCV 81.6 06/22/2022   PLT 445 (H) 06/22/2022      Chemistry       Component Value Date/Time   NA 135 06/05/2022 0022   K 3.4 (L) 06/05/2022 0022   CL 104 06/05/2022 0022   CO2 21 (L) 06/05/2022 0022   BUN 10 06/05/2022 0022   CREATININE 0.67 06/05/2022 0022   CREATININE 0.71 04/22/2020 1037   CREATININE 0.51 05/01/2012 1104      Component Value Date/Time   CALCIUM 8.5 (L) 06/05/2022 0022   ALKPHOS 53 06/05/2022 0022   AST 18 06/05/2022 0022   AST 10 (L) 04/22/2020 1037   ALT 14 06/05/2022 0022   ALT 10 04/22/2020 1037   BILITOT 0.3 06/05/2022 0022   BILITOT 0.2 11/17/2021 1408   BILITOT 0.3 04/22/2020 1037       RADIOGRAPHIC STUDIES: US Pelvis Complete  Result Date: 06/06/2022 CLINICAL DATA:  Vaginal bleeding and pelvic pain.  LMP 05/24/2022 EXAM: TRANSABDOMINAL AND TRANSVAGINAL ULTRASOUND OF PELVIS DOPPLER ULTRASOUND OF OVARIES TECHNIQUE: Both transabdominal and transvaginal ultrasound examinations of the pelvis were performed. Transabdominal technique was performed for global imaging of the pelvis including uterus, ovaries, adnexal regions, and pelvic cul-de-sac. It was necessary to proceed with endovaginal exam following the transabdominal exam to visualize the endometrium, adnexa. Color and duplex Doppler ultrasound was utilized to evaluate blood flow to the ovaries. COMPARISON:  CT abdomen and pelvis 06/03/2022 FINDINGS: Uterus Measurements: 15.5 x 9.5 x 12.0 cm = volume: 920.3 mL. Uterus is enlarged secondary to a heterogenous vascular mass arising near the uterine fundus measuring 10.0 x 8.3 x 10.1 cm compatible with fibroid. Endometrium Thickness: 10.1.  No focal abnormality visualized. Right ovary Measurements: 4.5 x 3.6 x 3.2 cm = volume: 26.9 mL. Normal appearance/no adnexal mass. Left ovary Measurements: 4.1 x 2.4 x 2.3 cm = volume: 11.3 mL. Normal appearance/no adnexal mass. Pulsed Doppler evaluation of the left ovary demonstrates normal low-resistance arterial and venous waveforms. The right ovary is poorly visualized and arterial and  venous flow are not well assessed. There does appear to be some color Doppler flow in the right ovary Other findings No abnormal free fluid. IMPRESSION: 1. Enlarged fibroid uterus. 2. 10 mm thickness of the endometrium. Bleeding may be related to the large uterine fibroid. If bleeding remains unresponsive to hormonal or medical therapy, sonohysterogram should be considered. (Ref: Radiological Reasoning: Algorithmic Workup of Abnormal Vaginal Bleeding with Endovaginal Sonography and Sonohysterography. AJR 2008; 161:W96-04) 3. Normal appearance of the left ovary without torsion. 4. Poor visualization of the right ovary. Arterial and venous flow are not well assessed. Electronically Signed   By: Minerva Fester M.D.   On: 06/06/2022 01:51   US Transvaginal Non-OB  Result Date: 06/06/2022 CLINICAL DATA:  Vaginal bleeding and pelvic pain.  LMP 05/24/2022 EXAM: TRANSABDOMINAL AND TRANSVAGINAL ULTRASOUND OF PELVIS DOPPLER ULTRASOUND OF OVARIES TECHNIQUE: Both transabdominal and transvaginal ultrasound examinations of the pelvis were performed. Transabdominal technique was performed for global imaging of the pelvis including uterus, ovaries, adnexal regions, and pelvic cul-de-sac. It was necessary to proceed with endovaginal exam following the transabdominal exam to visualize the endometrium, adnexa. Color and duplex Doppler ultrasound was utilized to evaluate blood flow to the ovaries. COMPARISON:  CT abdomen and pelvis 06/03/2022 FINDINGS: Uterus Measurements: 15.5 x 9.5 x 12.0 cm = volume: 920.3 mL. Uterus is enlarged secondary to a heterogenous vascular mass arising near the uterine fundus measuring 10.0 x 8.3 x 10.1 cm compatible with fibroid. Endometrium Thickness: 10.1.  No focal abnormality visualized. Right ovary Measurements: 4.5 x 3.6 x 3.2 cm = volume: 26.9 mL. Normal appearance/no adnexal mass. Left ovary Measurements: 4.1 x 2.4 x 2.3 cm = volume: 11.3 mL. Normal appearance/no adnexal mass. Pulsed Doppler  evaluation of the left ovary demonstrates normal low-resistance arterial and venous waveforms. The right ovary is poorly visualized and arterial and venous flow are not well assessed. There does appear to be some color Doppler flow in the right ovary Other findings No abnormal free fluid. IMPRESSION: 1. Enlarged fibroid uterus. 2. 10 mm thickness of the endometrium. Bleeding may be related to the large uterine fibroid. If bleeding remains unresponsive to hormonal or medical therapy, sonohysterogram should be considered. (Ref: Radiological Reasoning: Algorithmic Workup of Abnormal Vaginal Bleeding with Endovaginal Sonography and Sonohysterography. AJR 2008; 841:L24-40) 3. Normal appearance of the left ovary without torsion. 4. Poor visualization of the right ovary. Arterial and venous flow are not well assessed. Electronically Signed   By: Minerva Fester M.D.   On: 06/06/2022 01:51   [place] Art/Ven Flow Abd Pelv Doppler  Result Date: 06/06/2022 CLINICAL DATA:  Vaginal bleeding and pelvic pain.  LMP 05/24/2022 EXAM: TRANSABDOMINAL AND TRANSVAGINAL ULTRASOUND OF PELVIS DOPPLER ULTRASOUND OF OVARIES TECHNIQUE: Both transabdominal and transvaginal ultrasound examinations of the pelvis were performed. Transabdominal technique was performed for global imaging of the pelvis including uterus, ovaries, adnexal regions, and pelvic cul-de-sac. It was necessary to proceed with endovaginal exam following the transabdominal exam to visualize the endometrium, adnexa. Color and duplex Doppler ultrasound was utilized to evaluate blood flow to the ovaries. COMPARISON:  CT abdomen and pelvis 06/03/2022 FINDINGS: Uterus Measurements: 15.5 x 9.5 x 12.0 cm = volume: 920.3 mL. Uterus is enlarged secondary to a heterogenous vascular mass arising near the uterine fundus measuring 10.0 x 8.3 x 10.1 cm compatible with fibroid. Endometrium Thickness: 10.1.  No focal abnormality visualized. Right ovary Measurements: 4.5 x 3.6 x 3.2 cm = volume:  26.9 mL. Normal appearance/no adnexal mass. Left ovary Measurements: 4.1 x 2.4 x 2.3 cm = volume: 11.3 mL. Normal appearance/no adnexal mass. Pulsed Doppler evaluation of the left ovary demonstrates normal low-resistance arterial and venous waveforms. The right ovary is poorly visualized and arterial and venous flow are not well assessed. There does appear to be some color Doppler flow in the right ovary Other findings No abnormal free fluid. IMPRESSION: 1. Enlarged fibroid uterus. 2. 10 mm thickness of the endometrium. Bleeding may be related to the large uterine fibroid. If bleeding remains unresponsive to hormonal or medical therapy, sonohysterogram should be considered. (Ref: Radiological Reasoning: Algorithmic Workup of Abnormal Vaginal Bleeding with Endovaginal Sonography and Sonohysterography. AJR 2008; 102:V25-36) 3. Normal appearance of the left ovary without torsion. 4. Poor visualization of the right ovary. Arterial and venous flow are not well assessed. Electronically Signed   By: Minerva Fester M.D.   On: 06/06/2022 01:51   CT Abdomen Pelvis W Contrast  Result Date: 06/03/2022 CLINICAL DATA:  Vaginal bleeding and abdominal pain. Left flank pain. EXAM: CT ABDOMEN AND PELVIS WITH CONTRAST TECHNIQUE: Multidetector CT imaging of the abdomen and pelvis was performed using the standard protocol following bolus administration of intravenous contrast. RADIATION DOSE REDUCTION: This exam was performed according to  the departmental dose-optimization program which includes automated exposure control, adjustment of the mA and/or kV according to patient size and/or use of iterative reconstruction technique. CONTRAST:  75mL OMNIPAQUE IOHEXOL 350 MG/ML SOLN COMPARISON:  None. FINDINGS: Lower chest: No acute abnormality. Hepatobiliary: The liver is enlarged with mild diffuse fatty infiltration. Gallbladder and bile ducts are within normal limits. Pancreas: Unremarkable. No pancreatic ductal dilatation or  surrounding inflammatory changes. Spleen: Normal in size without focal abnormality. Adrenals/Urinary Tract: Adrenal glands are unremarkable. Kidneys are normal, without renal calculi, focal lesion, or hydronephrosis. Bladder is unremarkable. Stomach/Bowel: Stomach is within normal limits. Appendix appears normal. No evidence of bowel wall thickening, distention, or inflammatory changes. Vascular/Lymphatic: No significant vascular findings are present. No enlarged abdominal or pelvic lymph nodes. Reproductive: The uterus is enlarged and diffusely heterogeneous measuring 11.6 x 12.8 x 9.6 cm. Left ovary is grossly within normal limits. There is a 3.2 cm hypodense area measuring 34 Hounsfield units in the right ovary, possibly a hemorrhagic cyst. Other: Small fat containing umbilical hernia.  No ascites. Musculoskeletal: No acute or significant osseous findings. IMPRESSION: 1. The uterus is enlarged and diffusely heterogeneous. Findings may be related to fibroids or adenomyosis. Recommend further evaluation with pelvic ultrasound. 2. 3.2 cm hypodense area in the right ovary, possibly a hemorrhagic cyst. This can be further evaluated with pelvic ultrasound. 3. Hepatomegaly with fatty infiltration of the liver. 4. Small fat containing umbilical hernia. Electronically Signed   By: Darliss Cheney M.D.   On: 06/03/2022 22:43    ASSESSMENT AND PLAN: This is a very pleasant 47 years old female with persistent iron deficiency anemia secondary to menorrhagia.  The patient was treated recently with iron infusion with Feraheme 510 mg IV weekly for 2 doses.  She also recently received iron infusion with Venofer 300 Mg IV weekly for 3 weeks last dose was given in July 2023. The patient continues to have significant menstrual bleeding and menorrhagia.  She is scheduled to see a gynecologist tomorrow for evaluation and recommendation regarding her condition. For the significant iron deficiency anemia her CBC today showed  hemoglobin of 8.5 and hematocrit 27.5% with increased platelets count to 445,000 I recommended for the patient to proceed with iron infusion with Feraheme 510 Mg IV weekly for 2 weeks at the [facility]. I will see her back for follow-up visit in 3 months for evaluation and repeat blood work. She was advised to call immediately if she has any other concerning symptoms in the interval. The patient voices understanding of current disease status and treatment options and is in agreement with the current care plan.  All questions were answered. The patient knows to call the clinic with any problems, questions or concerns. We can certainly see the patient much sooner if necessary.  Disclaimer: This note was dictated with voice recognition software. Similar sounding words can inadvertently be transcribed and may not be corrected upon review.

## 2022-06-23 ENCOUNTER — Other Ambulatory Visit: Payer: Self-pay | Admitting: Internal Medicine

## 2022-06-23 ENCOUNTER — Telehealth: Payer: Self-pay | Admitting: Pharmacy Technician

## 2022-06-23 DIAGNOSIS — D259 Leiomyoma of uterus, unspecified: Secondary | ICD-10-CM | POA: Diagnosis not present

## 2022-06-23 NOTE — Telephone Encounter (Signed)
Dr. Arbutus Ped,  Briana Fuentes is non preferred medication and will be denied if patient has not failed or tried preferred medications. Preferred medications are Venofer and Ferrlecit. Due to venofer shortage would you like to try Ferrlecit?  Venofer = shortage Ferrlecit  = preferred  Auth Submission: NO AUTH NEEDED Site of care: Site of care: [facility] Payer: [employer] Medication & CPT/J Code(s) submitted: Ferrlecit (Ferric Gluconate) [id number] Route of submission (phone, fax, portal):  Phone # Fax # Auth type: Buy/Bill Units/visits requested:  Reference number:  Approval from: 06/23/22 to 10/24/22

## 2022-06-29 ENCOUNTER — Ambulatory Visit (INDEPENDENT_AMBULATORY_CARE_PROVIDER_SITE_OTHER): Payer: 59

## 2022-06-29 VITALS — BP 101/70 | HR 88 | Temp 98.4°F | Resp 18 | Ht 65.0 in | Wt 140.1 lb

## 2022-06-29 DIAGNOSIS — N920 Excessive and frequent menstruation with regular cycle: Secondary | ICD-10-CM | POA: Diagnosis not present

## 2022-06-29 DIAGNOSIS — D500 Iron deficiency anemia secondary to blood loss (chronic): Secondary | ICD-10-CM | POA: Diagnosis not present

## 2022-06-29 MED ORDER — DIPHENHYDRAMINE HCL 50 MG/ML IJ SOLN: 50.0000 mg | Freq: Once | INTRAMUSCULAR | Status: DC | PRN

## 2022-06-29 MED ORDER — SODIUM CHLORIDE 0.9 % IV SOLN
250.0000 mg | INTRAVENOUS | Status: DC
  Administered 2022-06-29: 250 mg via INTRAVENOUS
  Filled 2022-06-29: qty 20

## 2022-06-29 MED ORDER — METHYLPREDNISOLONE SODIUM SUCC 125 MG IJ SOLR: 125.0000 mg | Freq: Once | INTRAMUSCULAR | Status: DC | PRN

## 2022-06-29 MED ORDER — ALBUTEROL SULFATE HFA 108 (90 BASE) MCG/ACT IN AERS: 2.0000 | INHALATION_SPRAY | Freq: Once | RESPIRATORY_TRACT | Status: DC | PRN

## 2022-06-29 MED ORDER — SODIUM CHLORIDE 0.9 % IV SOLN: Freq: Once | INTRAVENOUS | Status: DC | PRN

## 2022-06-29 MED ORDER — FAMOTIDINE IN NACL 20-0.9 MG/50ML-% IV SOLN: 20.0000 mg | Freq: Once | INTRAVENOUS | Status: DC | PRN

## 2022-06-29 MED ORDER — EPINEPHRINE 0.3 MG/0.3ML IJ SOAJ: 0.3000 mg | Freq: Once | INTRAMUSCULAR | Status: DC | PRN

## 2022-06-29 NOTE — Progress Notes (Signed)
Diagnosis: Iron Deficiency Anemia  Provider:  Chilton Greathouse MD  Procedure: IV Infusion  IV Type: Peripheral, IV Location: L Hand  Ferrlecit (ferric gluconate), Dose: 250 mg  Infusion Start Time: 0914  Infusion Stop Time: 1128  Post Infusion IV Care: Observation period completed   PIV discontinued  Discharge: Condition: Good, Destination: Home . AVS Provided  Performed by:  Marlow Baars [place]Burchett, RN

## 2022-06-30 ENCOUNTER — Encounter: Payer: Self-pay | Admitting: Internal Medicine

## 2022-07-06 ENCOUNTER — Other Ambulatory Visit: Payer: Self-pay | Admitting: Obstetrics and Gynecology

## 2022-07-06 ENCOUNTER — Ambulatory Visit (INDEPENDENT_AMBULATORY_CARE_PROVIDER_SITE_OTHER): Payer: 59

## 2022-07-06 VITALS — BP 102/70 | HR 74 | Temp 97.9°F | Resp 18 | Ht 65.0 in | Wt 140.0 lb

## 2022-07-06 DIAGNOSIS — D500 Iron deficiency anemia secondary to blood loss (chronic): Secondary | ICD-10-CM | POA: Diagnosis not present

## 2022-07-06 DIAGNOSIS — Z01818 Encounter for other preprocedural examination: Secondary | ICD-10-CM

## 2022-07-06 DIAGNOSIS — N920 Excessive and frequent menstruation with regular cycle: Secondary | ICD-10-CM

## 2022-07-06 DIAGNOSIS — E119 Type 2 diabetes mellitus without complications: Secondary | ICD-10-CM | POA: Diagnosis not present

## 2022-07-06 MED ORDER — EPINEPHRINE 0.3 MG/0.3ML IJ SOAJ: 0.3000 mg | Freq: Once | INTRAMUSCULAR | Status: DC | PRN

## 2022-07-06 MED ORDER — SODIUM CHLORIDE 0.9 % IV SOLN: Freq: Once | INTRAVENOUS | Status: DC | PRN

## 2022-07-06 MED ORDER — DIPHENHYDRAMINE HCL 50 MG/ML IJ SOLN: 50.0000 mg | Freq: Once | INTRAMUSCULAR | Status: DC | PRN

## 2022-07-06 MED ORDER — FAMOTIDINE IN NACL 20-0.9 MG/50ML-% IV SOLN: 20.0000 mg | Freq: Once | INTRAVENOUS | Status: DC | PRN

## 2022-07-06 MED ORDER — ALBUTEROL SULFATE HFA 108 (90 BASE) MCG/ACT IN AERS: 2.0000 | INHALATION_SPRAY | Freq: Once | RESPIRATORY_TRACT | Status: DC | PRN

## 2022-07-06 MED ORDER — METHYLPREDNISOLONE SODIUM SUCC 125 MG IJ SOLR: 125.0000 mg | Freq: Once | INTRAMUSCULAR | Status: DC | PRN

## 2022-07-06 MED ORDER — SODIUM CHLORIDE 0.9 % IV SOLN
250.0000 mg | INTRAVENOUS | Status: DC
  Administered 2022-07-06: 250 mg via INTRAVENOUS
  Filled 2022-07-06: qty 20

## 2022-07-06 NOTE — Progress Notes (Signed)
Diagnosis: Iron Deficiency Anemia  Provider:  Chilton Greathouse MD  Procedure: IV Infusion  IV Type: Peripheral, IV Location: L Forearm  Ferrlecit (ferric gluconate), Dose: 250 mg  Infusion Start Time: 0915  Infusion Stop Time: 1128  Post Infusion IV Care: Peripheral IV Discontinued  Discharge: Condition: Good, Destination: Home . AVS Declined  Performed by:  Garnette Czech, RN

## 2022-07-12 ENCOUNTER — Other Ambulatory Visit: Payer: Self-pay | Admitting: Obstetrics and Gynecology

## 2022-07-12 DIAGNOSIS — D259 Leiomyoma of uterus, unspecified: Secondary | ICD-10-CM | POA: Diagnosis not present

## 2022-07-12 DIAGNOSIS — Z01818 Encounter for other preprocedural examination: Secondary | ICD-10-CM

## 2022-07-13 ENCOUNTER — Ambulatory Visit (INDEPENDENT_AMBULATORY_CARE_PROVIDER_SITE_OTHER): Payer: 59

## 2022-07-13 ENCOUNTER — Encounter (HOSPITAL_BASED_OUTPATIENT_CLINIC_OR_DEPARTMENT_OTHER): Payer: Self-pay | Admitting: Obstetrics and Gynecology

## 2022-07-13 ENCOUNTER — Other Ambulatory Visit: Payer: Self-pay | Admitting: Obstetrics and Gynecology

## 2022-07-13 ENCOUNTER — Other Ambulatory Visit: Payer: Self-pay

## 2022-07-13 VITALS — BP 107/72 | HR 85 | Temp 97.9°F | Resp 18 | Ht 65.0 in | Wt 143.2 lb

## 2022-07-13 DIAGNOSIS — D500 Iron deficiency anemia secondary to blood loss (chronic): Secondary | ICD-10-CM | POA: Diagnosis not present

## 2022-07-13 DIAGNOSIS — Z01818 Encounter for other preprocedural examination: Secondary | ICD-10-CM

## 2022-07-13 DIAGNOSIS — N920 Excessive and frequent menstruation with regular cycle: Secondary | ICD-10-CM

## 2022-07-13 MED ORDER — SODIUM CHLORIDE 0.9 % IV SOLN
250.0000 mg | INTRAVENOUS | Status: DC
  Administered 2022-07-13: 250 mg via INTRAVENOUS
  Filled 2022-07-13: qty 20

## 2022-07-13 NOTE — Progress Notes (Addendum)
Your procedure is scheduled on Wednesday, 07/21/2022.  Report to [facility] AT  7:45 AM.   Call this number if you have problems the morning of surgery  :[phone].   OUR ADDRESS IS [address].  [facility] LOCATED IN [facility].  PLEASE BRING YOUR INSURANCE CARD AND PHOTO ID DAY OF SURGERY.  ONLY 2 PEOPLE ARE ALLOWED IN  WAITING  ROOM                                      REMEMBER:  DO NOT EAT FOOD, CANDY GUM OR MINTS  AFTER MIDNIGHT THE NIGHT BEFORE YOUR SURGERY . YOU MAY HAVE CLEAR LIQUIDS FROM MIDNIGHT THE NIGHT BEFORE YOUR SURGERY UNTIL  6:45 AM. NO CLEAR LIQUIDS AFTER   6:45 AM DAY OF SURGERY.  YOU MAY  BRUSH YOUR TEETH MORNING OF SURGERY AND RINSE YOUR MOUTH OUT, NO CHEWING GUM CANDY OR MINTS.     CLEAR LIQUID DIET    Allowed      Water                                                                   Coffee and tea, regular and decaf  (NO cream or milk products of any type, may sweeten)                         Carbonated beverages, regular and diet                                    Sports drinks like Gatorade _____________________________________________________________________     TAKE ONLY THESE MEDICATIONS MORNING OF SURGERY: Synthroid, Lyrica  Do NOT take Metformin on the morning of surgery.                                        DO NOT WEAR JEWERLY/  METAL/  PIERCINGS (INCLUDING NO PLASTIC PIERCINGS) DO NOT WEAR LOTIONS, POWDERS, PERFUMES OR NAIL POLISH ON YOUR FINGERNAILS. TOENAIL POLISH IS OK TO WEAR. DO NOT SHAVE FOR 48 HOURS PRIOR TO DAY OF SURGERY.  CONTACTS, GLASSES, OR DENTURES MAY NOT BE WORN TO SURGERY.  REMEMBER: NO SMOKING, VAPING ,  DRUGS OR ALCOHOL FOR 24 HOURS BEFORE YOUR SURGERY.                                    [facility] IS NOT RESPONSIBLE  FOR ANY BELONGINGS.                                                                    [name]   - Preparing for Surgery Before surgery,  you can play an important role.  Because skin is not sterile, your skin needs to be as free of germs as possible.  You can reduce the number of germs on your skin by washing with CHG (chlorahexidine gluconate) soap before surgery.  CHG is an antiseptic cleaner which kills germs and bonds with the skin to continue killing germs even after washing. Please DO NOT use if you have an allergy to CHG or antibacterial soaps.  If your skin becomes reddened/irritated stop using the CHG and inform your nurse when you arrive at Short Stay. Do not shave (including legs and underarms) for at least 48 hours prior to the first CHG shower.  You may shave your face/neck. Please follow these instructions carefully:  1.  Shower with CHG Soap the night before surgery and the  morning of Surgery.  2.  If you choose to wash your hair, wash your hair first as usual with your  normal  shampoo.  3.  After you shampoo, rinse your hair and body thoroughly to remove the  shampoo.                                        4.  Use CHG as you would any other liquid soap.  You can apply chg directly  to the skin and wash , chg soap provided, night before and morning of your surgery.  5.  Apply the CHG Soap to your body ONLY FROM THE NECK DOWN.   Do not use on face/ open                           Wound or open sores. Avoid contact with eyes, ears mouth and genitals (private parts).                       Wash face,  Genitals (private parts) with your normal soap.             6.  Wash thoroughly, paying special attention to the area where your surgery  will be performed.  7.  Thoroughly rinse your body with warm water from the neck down.  8.  DO NOT shower/wash with your normal soap after using and rinsing off  the CHG Soap.             9.  Pat yourself dry with a clean towel.            10.  Wear clean pajamas.            11.  Place clean sheets on your bed the night of your first shower and do not  sleep with pets. Day of Surgery : Do  not apply any lotions/ powders the morning of surgery.  Please wear clean clothes to the hospital/surgery center.  IF YOU HAVE ANY SKIN IRRITATION OR PROBLEMS WITH THE SURGICAL SOAP, PLEASE GET A BAR OF GOLD DIAL SOAP AND SHOWER THE NIGHT BEFORE YOUR SURGERY AND THE MORNING OF YOUR SURGERY. PLEASE LET THE NURSE KNOW MORNING OF YOUR SURGERY IF YOU HAD ANY PROBLEMS WITH THE SURGICAL SOAP.   YOUR SURGEON MAY HAVE REQUESTED EXTENDED RECOVERY TIME AFTER YOUR SURGERY. IT COULD BE A  JUST A FEW HOURS  UP TO AN OVERNIGHT STAY.  YOUR SURGEON SHOULD HAVE  DISCUSSED THIS WITH YOU PRIOR TO YOUR SURGERY. IN THE EVENT YOU NEED TO STAY OVERNIGHT PLEASE REFER TO THE FOLLOWING GUIDELINES. YOU MAY HAVE UP TO 4 VISITORS  MAY VISIT IN THE EXTENDED RECOVERY ROOM UNTIL 800 PM ONLY.  ONE  VISITOR AGE 49 AND OVER MAY SPEND THE NIGHT AND MUST BE IN EXTENDED RECOVERY ROOM NO LATER THAN 800 PM . YOUR DISCHARGE TIME AFTER YOU SPEND THE NIGHT IS 900 AM THE MORNING AFTER YOUR SURGERY. YOU MAY PACK A SMALL OVERNIGHT BAG WITH TOILETRIES FOR YOUR OVERNIGHT STAY IF YOU WISH.  REGARDLESS OF IF YOU STAY OVER NIGHT OR ARE DISCHARGED THE SAME DAY YOU WILL BE REQUIRED TO HAVE A RESPONSIBLE ADULT (18 YRS OLD OR OLDER) STAY WITH YOU FOR AT LEAST THE FIRST 24 HOURS  YOUR PRESCRIPTION MEDICATIONS WILL BE PROVIDED DURING YOUR HOSPITAL STAY.  ________________________________________________________________________                                                        QUESTIONS Briana Fuentes PRE OP NURSE PHONE [phone].

## 2022-07-13 NOTE — Progress Notes (Signed)
Spoke w/ via phone for pre-op interview---[facility] Lab needs dos----  cbc, type & screen, bmp,EKG (Patient stated that she is scheduled to have a blood transfusion on 07/20/22. Could not do lab work before day of surgery.)             Lab results------none COVID test -----patient states asymptomatic no test needed Arrive at -------0745 on Wednesday, 07/21/22 NPO after MN NO Solid Food.  Clear liquids from MN until---0645 Med rec completed Medications to take morning of surgery -----Synthroid, Lyrica Diabetic medication -----Hold Metformin morning of surgery. Patient instructed no nail polish to be worn day of surgery Patient instructed to bring photo id and insurance card day of surgery Patient aware to have Driver (ride ) / caregiver    for 24 hours after surgery - husband - Habib Patient Special Instructions -----Extended / overnight stay instructions given. Pre-Op special Instructions -----none Patient verbalized understanding of instructions that were given at this phone interview. Patient denies shortness of breath, chest pain, fever, cough at this phone interview.

## 2022-07-13 NOTE — Progress Notes (Signed)
Diagnosis: Iron Deficiency Anemia  Provider:  Chilton Greathouse MD  Procedure: IV Infusion  IV Type: Peripheral, IV Location: L Hand  Ferrlecit (ferric gluconate), Dose: 250 mg  Infusion Start Time: 0929  Infusion Stop Time: 1136  Post Infusion IV Care: Peripheral IV Discontinued  Discharge: Condition: Good, Destination: Home . AVS Provided  Performed by:  Adriana Mccallum, RN

## 2022-07-14 ENCOUNTER — Telehealth: Payer: Self-pay

## 2022-07-14 NOTE — Telephone Encounter (Signed)
Please return Dr. Henderson Cloud call for blood transfusion for this pt before her surgery. She advised that she has some instructions. She can be reached at [phone].

## 2022-07-20 ENCOUNTER — Non-Acute Institutional Stay (HOSPITAL_COMMUNITY)
Admission: RE | Admit: 2022-07-20 | Discharge: 2022-07-20 | Disposition: A | Discharge status: Home / Self Care | Payer: 59 | Source: Ambulatory Visit | Attending: Internal Medicine | Admitting: Internal Medicine

## 2022-07-20 DIAGNOSIS — Z01818 Encounter for other preprocedural examination: Secondary | ICD-10-CM | POA: Insufficient documentation

## 2022-07-20 LAB — TYPE AND SCREEN
ABO/RH(D): O POS
Antibody Screen: NEGATIVE

## 2022-07-20 LAB — BPAM RBC
Blood Product Expiration Date: 202407012359
ISSUE DATE / TIME: 202405281045
Unit Type and Rh: 5100

## 2022-07-20 MED ORDER — SODIUM CHLORIDE 0.9% IV SOLUTION: Freq: Once | INTRAVENOUS | Status: AC

## 2022-07-20 MED ORDER — DIPHENHYDRAMINE HCL 50 MG/ML IJ SOLN: 25.0000 mg | Freq: Four times a day (QID) | INTRAMUSCULAR | Status: DC

## 2022-07-20 MED ORDER — DIPHENHYDRAMINE HCL 25 MG PO CAPS
50.0000 mg | ORAL_CAPSULE | Freq: Four times a day (QID) | ORAL | Status: DC
  Administered 2022-07-20: 50 mg via ORAL
  Filled 2022-07-20: qty 2

## 2022-07-20 MED ORDER — ACETAMINOPHEN 500 MG PO TABS
1000.0000 mg | ORAL_TABLET | Freq: Four times a day (QID) | ORAL | Status: DC
  Administered 2022-07-20: 1000 mg via ORAL
  Filled 2022-07-20: qty 2

## 2022-07-20 NOTE — Progress Notes (Signed)
PATIENT CARE CENTER NOTE   Diagnosis: Preoperative Blood Administration    Provider: Carrington Clamp, MD   Procedure: Blood transfusion    Note: Patient received 2 units PRBC's via PIV. Patient pre-medicated with Tylenol and Benadryl per order. Tolerated transfusions well with no adverse reaction. Vital signs stable. Discharge instructions given. Patient has surgery scheduled for tomorrow and reminded of appointment. Patient alert, oriented and ambulatory at discharge.

## 2022-07-20 NOTE — H&P (Signed)
47 y.o. [id number] complains of severe anemia from severe menomet from fibroid uterus.  Previously: [facility] pt; Pt. here for consult for hysterectomy; Pt. c/o irregular/heavy bleeding, has been bleeding for 2 months; Pt. was seen in ER on 04/14 and had US done; started Norethindrone 5mg  on 04/17; Pt. saw heme yesterday and hgb-8.5, going to get iron infusion soon; Pt. has type 2 DM, last A1c-7.5/tb//  Per Dr. Roe Rutherford the patient to complete Megace course and follow this with norethindrone acetate 5 mg to attempt to continue bleeding control while waiting for definitive surgical management. I advised the patient that I would refer her to one of my colleagues who performs hysterectomies. Discussed, that immediate surgical intervention may not be possible and that medical management may be required in the short-term until definitive surgery can be scheduled. Patient was agreeable to this plan."  Per last visit: "Irregular bleeding resumed and patient represented to the emergency room on 06/03/2022. Hemoglobin was 10.4. She was treated for a UTI with Keflex. A CT scan of the abdomen and pelvis was performed at that time. CT showed Reproductive: The uterus is enlarged and diffusely heterogeneous  measuring 11.6 x 12.8 x 9.6 cm. Left ovary is grossly within normal  limits. There is a 3.2 cm hypodense area measuring 34 Hounsfield  units in the right ovary, possibly a hemorrhagic cyst. SHe returned to the emergency room on 06/05/2022. Pelvic ultrasound was formed  Uterus is  enlarged secondary to a heterogenous vascular mass arising near the  uterine fundus measuring 10.0 x 8.3 x 10.1 cm compatible with  fibroid.Endometrium Thickness: 10.1. No focal abnormality visualized. " Ovaries reported normal.   Type 2 DM last A1C was 7.5 a week ago. Hx of C/S.  Periods are very heavy; went to ER twice last month but Norethindrone keeping from heavy. But bleeding every day. Only stopped for a few days.  Hgb now down to  8.5, to get iron transfusion. "  Recently pt has been bleeding through [facility] as well and H/H was last 6.7.  Pt receiving 2 units transfusion day before surgery.  A1C still at 7.2 and pt working to keep sugars under control.    Past Medical History:  Diagnosis Date   Anxiety    resolved   Gestational diabetes    Hypothyroidism    endocronologist, Dr. Talmage Nap   Infertility, female    Iron deficiency anemia secondary to blood loss (chronic)    due to menorrhagia, s/p iron infusions, most recent 06/2022, Dr. Arbutus Ped, Hematology   Neuromuscular disorder [facility])    Neuropathy    nerve pain   Toxic multinodular goiter 2006   right thyroid lobectomy 2006, recurrent hyperthyroidism with radioactive thyroid treatment, 12/2008 with Dr. Talmage Nap   Type 2 diabetes mellitus (HCC)    endocronogist, Dr. Talmage Nap   Past Surgical History:  Procedure Laterality Date   CESAREAN SECTION N/A 08/10/2012   Procedure: CESAREAN SECTION;  Surgeon: Catalina Antigua, MD;  Location: [facility];  Service: Obstetrics;  Laterality: N/A;  Primary Cesarean Section Delivery Baby Girl @ 2126, Apgars 9/9   THYROIDECTOMY  2006   right thyroid lobectomy    Social History   Socioeconomic History   Marital status: Married    Spouse name: Not on file   Number of children: Not on file   Years of education: Not on file   Highest education level: Not on file  Occupational History   Not on file  Tobacco Use   Smoking status: Never  Smokeless tobacco: Never  Vaping Use   Vaping Use: Never used  Substance and Sexual Activity   Alcohol use: No   Drug use: No   Sexual activity: Yes    Birth control/protection: None  Other Topics Concern   Not on file  Social History Narrative   She lives with husband, three children, and mother.     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     Social Determinants of Health   Financial Resource Strain: Not on file  Food  Insecurity: Not on file  Transportation Needs: Not on file  Physical Activity: Not on file  Stress: Not on file  Social Connections: Not on file  Intimate Partner Violence: Not on file    No current facility-administered medications on file prior to encounter.   Current Outpatient Medications on File Prior to Encounter  Medication Sig Dispense Refill   norethindrone (AYGESTIN) 5 MG tablet Take by mouth daily.     cephALEXin (KEFLEX) 500 MG capsule Take 1 capsule (500 mg total) by mouth 4 (four) times daily. (Patient not taking: Reported on 07/13/2022) 20 capsule 0   FeFum-FePoly-FA-B Cmp-C-Biot (INTEGRA PLUS) CAPS Take 1 capsule by mouth daily. (Patient not taking: Reported on 06/06/2022) 30 capsule 3   levothyroxine (SYNTHROID) 125 MCG tablet Take 125 mcg by mouth daily before breakfast.     metFORMIN (GLUCOPHAGE-XR) 500 MG 24 hr tablet TAKE 1 TABLET BY MOUTH TWICE A DAY WITH MEALS (Patient taking differently: Take 500-1,000 mg by mouth in the morning and at bedtime. Take 2 tablets (1000 mg) in the morning and Take 1 tablet (500 mg) at bedtime) 60 tablet 0   pregabalin (LYRICA) 100 MG capsule Take 150 mg by mouth daily.      Allergies  Allergen Reactions   Codeine Nausea Only    Vitals:   07/13/22 1343  Weight: 64.4 kg    Lungs: clear to ascultation Cor:  RRR Abdomen:  soft, nontender, nondistended. Ex:  no cords, erythema Pelvic:   Vulva: no masses, no atrophy, no lesions Vagina: no tenderness, no erythema, no abnormal vaginal discharge, no vesicle(s) or ulcers, no cystocele, no rectocele Cervix: grossly normal, no discharge, no cervical motion tenderness (fibroid in LUS but does have about 1-2 cm of cervix all around) Uterus: no uterine prolapse, non-tender, enlarged, contour irregular, fibroids, anteverted, fixed (somewhat) Bladder/Urethra: normal meatus, no urethral discharge, no urethral mass, bladder non distended, Urethra well supported Adnexa/Parametria: no parametrial  tenderness, no parametrial mass, no adnexal tenderness, no ovarian mass    A:  For robotic TLH/salpingectomies/cysto.  Pt consented for possible e-lap if unable to complete via robot.     P: P: All risks, benefits and alternatives d/w patient and she desires to proceed.  Patient has undergone an ERAS protocol and will receive preop antibiotics and SCDs during the operation.   Pt to have extended recovery but will go home same day if eating, ambulating, voiding and pain control is good.  Loney Laurence

## 2022-07-21 ENCOUNTER — Ambulatory Visit: Payer: 59

## 2022-07-21 ENCOUNTER — Encounter (HOSPITAL_BASED_OUTPATIENT_CLINIC_OR_DEPARTMENT_OTHER): Payer: Self-pay | Admitting: Obstetrics and Gynecology

## 2022-07-21 ENCOUNTER — Ambulatory Visit (HOSPITAL_BASED_OUTPATIENT_CLINIC_OR_DEPARTMENT_OTHER): Payer: 59 | Admitting: Certified Registered"

## 2022-07-21 ENCOUNTER — Ambulatory Visit (HOSPITAL_BASED_OUTPATIENT_CLINIC_OR_DEPARTMENT_OTHER)
Admission: RE | Admit: 2022-07-21 | Discharge: 2022-07-21 | Disposition: A | Discharge status: Home / Self Care | Payer: 59 | Attending: Obstetrics and Gynecology | Admitting: Obstetrics and Gynecology

## 2022-07-21 ENCOUNTER — Encounter (HOSPITAL_BASED_OUTPATIENT_CLINIC_OR_DEPARTMENT_OTHER)
Admission: RE | Disposition: A | Discharge status: Home / Self Care | Payer: Self-pay | Source: Home / Self Care | Attending: Obstetrics and Gynecology

## 2022-07-21 ENCOUNTER — Other Ambulatory Visit: Payer: Self-pay

## 2022-07-21 DIAGNOSIS — E119 Type 2 diabetes mellitus without complications: Secondary | ICD-10-CM

## 2022-07-21 DIAGNOSIS — D259 Leiomyoma of uterus, unspecified: Secondary | ICD-10-CM | POA: Diagnosis not present

## 2022-07-21 DIAGNOSIS — E1140 Type 2 diabetes mellitus with diabetic neuropathy, unspecified: Secondary | ICD-10-CM | POA: Diagnosis not present

## 2022-07-21 DIAGNOSIS — Z9889 Other specified postprocedural states: Secondary | ICD-10-CM

## 2022-07-21 DIAGNOSIS — E039 Hypothyroidism, unspecified: Secondary | ICD-10-CM

## 2022-07-21 DIAGNOSIS — Z01818 Encounter for other preprocedural examination: Secondary | ICD-10-CM

## 2022-07-21 HISTORY — DX: Anxiety disorder, unspecified: F41.9

## 2022-07-21 HISTORY — DX: Type 2 diabetes mellitus without complications: E11.9

## 2022-07-21 HISTORY — PX: ROBOTIC ASSISTED TOTAL HYSTERECTOMY WITH BILATERAL SALPINGO OOPHERECTOMY: SHX6086

## 2022-07-21 HISTORY — PX: CYSTOSCOPY: SHX5120

## 2022-07-21 HISTORY — DX: Iron deficiency anemia secondary to blood loss (chronic): D50.0

## 2022-07-21 HISTORY — DX: Gestational diabetes mellitus in pregnancy, unspecified control: O24.419

## 2022-07-21 HISTORY — DX: Myoneural disorder, unspecified: G70.9

## 2022-07-21 LAB — BASIC METABOLIC PANEL
Anion gap: 11 (ref 5–15)
BUN: 14 mg/dL (ref 6–20)
CO2: 18 mmol/L — ABNORMAL LOW (ref 22–32)
Calcium: 8.6 mg/dL — ABNORMAL LOW (ref 8.9–10.3)
Chloride: 105 mmol/L (ref 98–111)
Creatinine, Ser: 0.85 mg/dL (ref 0.44–1.00)
GFR, Estimated: >60 mL/min (ref >60)
Glucose, Bld: 190 mg/dL — ABNORMAL HIGH (ref 70–99)
Potassium: 4.2 mmol/L (ref 3.5–5.1)
Sodium: 134 mmol/L — ABNORMAL LOW (ref 135–145)

## 2022-07-21 LAB — TYPE AND SCREEN
ABO/RH(D): O POS
Antibody Screen: NEGATIVE
Unit division: 0
Unit division: 0

## 2022-07-21 LAB — CBC
HCT: 36.7 % (ref 36.0–46.0)
Hemoglobin: 11.4 g/dL — ABNORMAL LOW (ref 12.0–15.0)
MCH: 27.4 pg (ref 26.0–34.0)
MCHC: 31.1 g/dL (ref 30.0–36.0)
MCV: 88.2 fL (ref 80.0–100.0)
Platelets: 345 10*3/uL (ref 150–400)
RBC: 4.16 MIL/uL (ref 3.87–5.11)
RDW: 19.5 % — ABNORMAL HIGH (ref 11.5–15.5)
WBC: 7.8 10*3/uL (ref 4.0–10.5)
nRBC: 0 % (ref 0.0–0.2)

## 2022-07-21 LAB — BPAM RBC
Blood Product Expiration Date: 202407012359
ISSUE DATE / TIME: 202405281045
Unit Type and Rh: 5100

## 2022-07-21 LAB — GLUCOSE, CAPILLARY
Glucose-Capillary: 202 mg/dL — ABNORMAL HIGH (ref 70–99)
Glucose-Capillary: 270 mg/dL — ABNORMAL HIGH (ref 70–99)

## 2022-07-21 LAB — POCT PREGNANCY, URINE: Preg Test, Ur: NEGATIVE

## 2022-07-21 SURGERY — HYSTERECTOMY, TOTAL, ROBOT-ASSISTED, LAPAROSCOPIC, WITH BILATERAL SALPINGO-OOPHORECTOMY
Anesthesia: General | Site: Bladder

## 2022-07-21 MED ORDER — INSULIN ASPART 100 UNIT/ML IJ SOLN
INTRAMUSCULAR | Status: AC
  Filled 2022-07-21: qty 1

## 2022-07-21 MED ORDER — OXYCODONE HCL 5 MG PO TABS
5.0000 mg | ORAL_TABLET | Freq: Once | ORAL | Status: AC | PRN
  Administered 2022-07-21: 5 mg via ORAL

## 2022-07-21 MED ORDER — MENTHOL 3 MG MT LOZG: 1.0000 | LOZENGE | OROMUCOSAL | Status: DC | PRN

## 2022-07-21 MED ORDER — FENTANYL CITRATE (PF) 100 MCG/2ML IJ SOLN
INTRAMUSCULAR | Status: DC | PRN
  Administered 2022-07-21 (×5): 50 ug via INTRAVENOUS

## 2022-07-21 MED ORDER — POVIDONE-IODINE 10 % EX SWAB
2.0000 | Freq: Once | CUTANEOUS | Status: AC
  Administered 2022-07-21: 2 via TOPICAL

## 2022-07-21 MED ORDER — FLUORESCEIN SODIUM 10 % IV SOLN
INTRAVENOUS | Status: DC | PRN
  Administered 2022-07-21: 1 mL via INTRAVENOUS

## 2022-07-21 MED ORDER — ONDANSETRON HCL 4 MG PO TABS: 4.0000 mg | ORAL_TABLET | Freq: Four times a day (QID) | ORAL | Status: DC | PRN

## 2022-07-21 MED ORDER — DEXAMETHASONE SODIUM PHOSPHATE 4 MG/ML IJ SOLN
INTRAMUSCULAR | Status: DC | PRN
  Administered 2022-07-21: 10 mg via INTRAVENOUS

## 2022-07-21 MED ORDER — LACTATED RINGERS IV SOLN: INTRAVENOUS | Status: DC

## 2022-07-21 MED ORDER — LIDOCAINE HCL (CARDIAC) PF 100 MG/5ML IV SOSY
PREFILLED_SYRINGE | INTRAVENOUS | Status: DC | PRN
  Administered 2022-07-21: 100 mg via INTRAVENOUS

## 2022-07-21 MED ORDER — ROCURONIUM BROMIDE 100 MG/10ML IV SOLN
INTRAVENOUS | Status: DC | PRN
  Administered 2022-07-21: 80 mg via INTRAVENOUS
  Administered 2022-07-21 (×2): 20 mg via INTRAVENOUS

## 2022-07-21 MED ORDER — CEFAZOLIN SODIUM-DEXTROSE 2-4 GM/100ML-% IV SOLN
2.0000 g | INTRAVENOUS | Status: AC
  Administered 2022-07-21: 2 g via INTRAVENOUS

## 2022-07-21 MED ORDER — ONDANSETRON HCL 4 MG/2ML IJ SOLN: 4.0000 mg | Freq: Once | INTRAMUSCULAR | Status: DC | PRN

## 2022-07-21 MED ORDER — KETOROLAC TROMETHAMINE 30 MG/ML IJ SOLN: 30.0000 mg | Freq: Once | INTRAMUSCULAR | Status: DC

## 2022-07-21 MED ORDER — METFORMIN HCL ER 500 MG PO TB24
500.0000 mg | ORAL_TABLET | Freq: Every day | ORAL | Status: DC
  Filled 2022-07-21: qty 1

## 2022-07-21 MED ORDER — HYDROMORPHONE HCL 1 MG/ML IJ SOLN
0.2500 mg | INTRAMUSCULAR | Status: DC | PRN
  Administered 2022-07-21 (×2): 0.5 mg via INTRAVENOUS

## 2022-07-21 MED ORDER — FLUORESCEIN SODIUM 10 % IV SOLN
INTRAVENOUS | Status: AC
  Filled 2022-07-21: qty 5

## 2022-07-21 MED ORDER — OXYCODONE HCL 5 MG PO TABS
ORAL_TABLET | ORAL | Status: AC
  Filled 2022-07-21: qty 1

## 2022-07-21 MED ORDER — ONDANSETRON HCL 4 MG/2ML IJ SOLN
INTRAMUSCULAR | Status: AC
  Filled 2022-07-21: qty 2

## 2022-07-21 MED ORDER — HEMOSTATIC AGENTS (NO CHARGE) OPTIME
TOPICAL | Status: DC | PRN
  Administered 2022-07-21: 1

## 2022-07-21 MED ORDER — SUGAMMADEX SODIUM 200 MG/2ML IV SOLN
INTRAVENOUS | Status: DC | PRN
  Administered 2022-07-21: 200 mg via INTRAVENOUS

## 2022-07-21 MED ORDER — HYDROMORPHONE HCL 1 MG/ML IJ SOLN
INTRAMUSCULAR | Status: AC
  Filled 2022-07-21: qty 1

## 2022-07-21 MED ORDER — PREGABALIN 75 MG PO CAPS: 150.0000 mg | ORAL_CAPSULE | Freq: Every day | ORAL | Status: DC

## 2022-07-21 MED ORDER — MIDAZOLAM HCL 2 MG/2ML IJ SOLN
INTRAMUSCULAR | Status: AC
  Filled 2022-07-21: qty 2

## 2022-07-21 MED ORDER — PROPOFOL 10 MG/ML IV BOLUS
INTRAVENOUS | Status: DC | PRN
  Administered 2022-07-21: 50 mg via INTRAVENOUS
  Administered 2022-07-21: 150 mg via INTRAVENOUS

## 2022-07-21 MED ORDER — OXYCODONE HCL 5 MG PO TABS
5.0000 mg | ORAL_TABLET | ORAL | Status: DC | PRN
  Administered 2022-07-21 (×2): 5 mg via ORAL

## 2022-07-21 MED ORDER — FENTANYL CITRATE (PF) 250 MCG/5ML IJ SOLN
INTRAMUSCULAR | Status: AC
  Filled 2022-07-21: qty 5

## 2022-07-21 MED ORDER — SENNA 8.6 MG PO TABS
1.0000 | ORAL_TABLET | Freq: Two times a day (BID) | ORAL | Status: DC
  Administered 2022-07-21: 8.6 mg via ORAL

## 2022-07-21 MED ORDER — SCOPOLAMINE 1 MG/3DAYS TD PT72
MEDICATED_PATCH | TRANSDERMAL | Status: DC | PRN
  Administered 2022-07-21: 1 via TRANSDERMAL

## 2022-07-21 MED ORDER — ROCURONIUM BROMIDE 10 MG/ML (PF) SYRINGE
PREFILLED_SYRINGE | INTRAVENOUS | Status: AC
  Filled 2022-07-21: qty 10

## 2022-07-21 MED ORDER — GABAPENTIN 300 MG PO CAPS
300.0000 mg | ORAL_CAPSULE | ORAL | Status: AC
  Administered 2022-07-21: 300 mg via ORAL

## 2022-07-21 MED ORDER — DROPERIDOL 2.5 MG/ML IJ SOLN
INTRAMUSCULAR | Status: DC | PRN
  Administered 2022-07-21: .625 mg via INTRAVENOUS

## 2022-07-21 MED ORDER — SCOPOLAMINE 1 MG/3DAYS TD PT72
MEDICATED_PATCH | TRANSDERMAL | Status: AC
  Filled 2022-07-21: qty 1

## 2022-07-21 MED ORDER — ACETAMINOPHEN 500 MG PO TABS
1000.0000 mg | ORAL_TABLET | ORAL | Status: AC
  Administered 2022-07-21: 1000 mg via ORAL

## 2022-07-21 MED ORDER — ONDANSETRON HCL 4 MG/2ML IJ SOLN: 4.0000 mg | Freq: Four times a day (QID) | INTRAMUSCULAR | Status: DC | PRN

## 2022-07-21 MED ORDER — CEFAZOLIN SODIUM-DEXTROSE 2-4 GM/100ML-% IV SOLN
INTRAVENOUS | Status: AC
  Filled 2022-07-21: qty 100

## 2022-07-21 MED ORDER — SENNA 8.6 MG PO TABS
ORAL_TABLET | ORAL | Status: AC
  Filled 2022-07-21: qty 1

## 2022-07-21 MED ORDER — GABAPENTIN 300 MG PO CAPS
ORAL_CAPSULE | ORAL | Status: AC
  Filled 2022-07-21: qty 1

## 2022-07-21 MED ORDER — OXYCODONE HCL 5 MG/5ML PO SOLN: 5.0000 mg | Freq: Once | ORAL | Status: AC | PRN

## 2022-07-21 MED ORDER — SODIUM CHLORIDE 0.9 % IR SOLN
Status: DC | PRN
  Administered 2022-07-21: 500 mL

## 2022-07-21 MED ORDER — SODIUM CHLORIDE 0.9 % IV SOLN
INTRAVENOUS | Status: DC | PRN
  Administered 2022-07-21: 60 mL

## 2022-07-21 MED ORDER — ONDANSETRON HCL 4 MG/2ML IJ SOLN
INTRAMUSCULAR | Status: DC | PRN
  Administered 2022-07-21: 4 mg via INTRAVENOUS

## 2022-07-21 MED ORDER — IBUPROFEN 800 MG PO TABS: 800.0000 mg | ORAL_TABLET | Freq: Three times a day (TID) | ORAL | Status: DC

## 2022-07-21 MED ORDER — PROPOFOL 10 MG/ML IV BOLUS
INTRAVENOUS | Status: AC
  Filled 2022-07-21: qty 20

## 2022-07-21 MED ORDER — SODIUM CHLORIDE 0.9 % IR SOLN
Status: DC | PRN
  Administered 2022-07-21: 1000 mL

## 2022-07-21 MED ORDER — LIDOCAINE HCL (PF) 2 % IJ SOLN
INTRAMUSCULAR | Status: AC
  Filled 2022-07-21: qty 5

## 2022-07-21 MED ORDER — MIDAZOLAM HCL 5 MG/5ML IJ SOLN
INTRAMUSCULAR | Status: DC | PRN
  Administered 2022-07-21: 2 mg via INTRAVENOUS

## 2022-07-21 MED ORDER — CELECOXIB 200 MG PO CAPS
400.0000 mg | ORAL_CAPSULE | ORAL | Status: AC
  Administered 2022-07-21: 400 mg via ORAL

## 2022-07-21 MED ORDER — LEVOTHYROXINE SODIUM 125 MCG PO TABS: 125.0000 ug | ORAL_TABLET | Freq: Every day | ORAL | Status: DC

## 2022-07-21 MED ORDER — INSULIN ASPART 100 UNIT/ML IJ SOLN
5.0000 [IU] | Freq: Once | INTRAMUSCULAR | Status: AC
  Administered 2022-07-21: 5 [IU] via SUBCUTANEOUS

## 2022-07-21 MED ORDER — METFORMIN HCL ER 500 MG PO TB24: 1000.0000 mg | ORAL_TABLET | Freq: Every day | ORAL | Status: DC

## 2022-07-21 MED ORDER — DEXAMETHASONE SODIUM PHOSPHATE 10 MG/ML IJ SOLN
INTRAMUSCULAR | Status: AC
  Filled 2022-07-21: qty 1

## 2022-07-21 MED ORDER — ACETAMINOPHEN 500 MG PO TABS
ORAL_TABLET | ORAL | Status: AC
  Filled 2022-07-21: qty 2

## 2022-07-21 MED ORDER — SOD CITRATE-CITRIC ACID 500-334 MG/5ML PO SOLN: 30.0000 mL | ORAL | Status: DC

## 2022-07-21 MED ORDER — CELECOXIB 200 MG PO CAPS
ORAL_CAPSULE | ORAL | Status: AC
  Filled 2022-07-21: qty 2

## 2022-07-21 SURGICAL SUPPLY — 68 items
ADH SKN CLS APL DERMABOND .7 (GAUZE/BANDAGES/DRESSINGS) ×2
APL SRG 38 LTWT LNG FL B (MISCELLANEOUS) ×2
APPLICATOR ARISTA FLEXITIP XL (MISCELLANEOUS) IMPLANT
BARRIER ADHS 3X4 INTERCEED (GAUZE/BANDAGES/DRESSINGS) IMPLANT
BRR ADH 4X3 ABS CNTRL BYND (GAUZE/BANDAGES/DRESSINGS)
COVER BACK TABLE 60X90IN (DRAPES) ×2 IMPLANT
COVER TIP SHEARS 8 DVNC (MISCELLANEOUS) ×2 IMPLANT
DEFOGGER SCOPE WARMER CLEARIFY (MISCELLANEOUS) ×2 IMPLANT
DERMABOND ADVANCED .7 DNX12 (GAUZE/BANDAGES/DRESSINGS) ×2 IMPLANT
DRAPE ARM DVNC X/XI (DISPOSABLE) ×8 IMPLANT
DRAPE COLUMN DVNC XI (DISPOSABLE) ×2 IMPLANT
DRAPE ROBOTICS STRL (DRAPES) ×2 IMPLANT
DRAPE SURG IRRIG POUCH 19X23 (DRAPES) ×2 IMPLANT
DRAPE UTILITY XL STRL (DRAPES) ×2 IMPLANT
DRIVER NDL MEGA SUTCUT DVNCXI (INSTRUMENTS) ×2 IMPLANT
DRIVER NDLE MEGA SUTCUT DVNCXI (INSTRUMENTS) ×2 IMPLANT
DURAPREP 26ML APPLICATOR (WOUND CARE) ×2 IMPLANT
ELECT REM PT RETURN 9FT ADLT (ELECTROSURGICAL) ×2
ELECTRODE REM PT RTRN 9FT ADLT (ELECTROSURGICAL) ×2 IMPLANT
FORCEPS BPLR FENES DVNC XI (FORCEP) ×2 IMPLANT
FORCEPS PROGRASP DVNC XI (FORCEP) IMPLANT
FORCEPS TENACULUM DVNC XI (FORCEP) IMPLANT
GAUZE 4X4 16PLY ~~LOC~~+RFID DBL (SPONGE) IMPLANT
GLOVE BIO SURGEON STRL SZ7 (GLOVE) ×6 IMPLANT
HEMOSTAT ARISTA ABSORB 3G PWDR (HEMOSTASIS) IMPLANT
HIBICLENS CHG 4% 4OZ BTL (MISCELLANEOUS) IMPLANT
HOLDER FOLEY CATH W/STRAP (MISCELLANEOUS) IMPLANT
IRRIG SUCT STRYKERFLOW 2 WTIP (MISCELLANEOUS) ×2
IRRIGATION SUCT STRKRFLW 2 WTP (MISCELLANEOUS) ×2 IMPLANT
IV NS 1000ML (IV SOLUTION) ×2
IV NS 1000ML BAXH (IV SOLUTION) IMPLANT
KIT PINK PAD W/HEAD ARE REST (MISCELLANEOUS) ×2
KIT PINK PAD W/HEAD ARM REST (MISCELLANEOUS) ×2 IMPLANT
KIT TURNOVER CYSTO (KITS) ×2 IMPLANT
LEGGING LITHOTOMY PAIR STRL (DRAPES) ×2 IMPLANT
MANIPULATOR ADVINCU DEL 2.5 PL (MISCELLANEOUS) IMPLANT
MANIPULATOR ADVINCU DEL 3.0 PL (MISCELLANEOUS) IMPLANT
MANIPULATOR ADVINCU DEL 3.5 PL (MISCELLANEOUS) IMPLANT
MANIPULATOR ADVINCU DEL 4.0 PL (MISCELLANEOUS) IMPLANT
NDL INSUFFLATION 14GA 120MM (NEEDLE) ×2 IMPLANT
NEEDLE INSUFFLATION 14GA 120MM (NEEDLE) ×2 IMPLANT
NS IRRIG 500ML POUR BTL (IV SOLUTION) IMPLANT
OBTURATOR OPTICAL STND 8 DVNC (TROCAR) ×2
OBTURATOR OPTICALSTD 8 DVNC (TROCAR) ×2 IMPLANT
OCCLUDER COLPOPNEUMO (BALLOONS) IMPLANT
PACK ROBOT WH (CUSTOM PROCEDURE TRAY) ×2 IMPLANT
PACK ROBOTIC GOWN (GOWN DISPOSABLE) ×2 IMPLANT
PAD OB MATERNITY 4.3X12.25 (PERSONAL CARE ITEMS) ×2 IMPLANT
PAD PREP 24X48 CUFFED NSTRL (MISCELLANEOUS) ×2 IMPLANT
POUCH LAPAROSCOPIC INSTRUMENT (MISCELLANEOUS) IMPLANT
PROTECTOR NERVE ULNAR (MISCELLANEOUS) ×4 IMPLANT
SCISSORS MNPLR CVD DVNC XI (INSTRUMENTS) ×2 IMPLANT
SEAL UNIV 5-12 XI (MISCELLANEOUS) ×6 IMPLANT
SET IRRIG Y TYPE TUR BLADDER L (SET/KITS/TRAYS/PACK) ×2 IMPLANT
SET TRI-LUMEN FLTR TB AIRSEAL (TUBING) ×2 IMPLANT
SLEEVE SCD COMPRESS KNEE MED (STOCKING) ×2 IMPLANT
SPIKE FLUID TRANSFER (MISCELLANEOUS) ×2 IMPLANT
SUT VIC AB 0 CT1 36 (SUTURE) ×2 IMPLANT
SUT VICRYL RAPIDE 3 0 (SUTURE) ×4 IMPLANT
SUT VLOC 180 0 9IN  GS21 (SUTURE) ×2
SUT VLOC 180 0 9IN GS21 (SUTURE) ×2 IMPLANT
SUT VLOC BARB 180 ABS3/0GR12 (SUTURE) ×2
SUTURE VLOC BRB 180 ABS3/0GR12 (SUTURE) IMPLANT
TOWEL OR 17X24 6PK STRL BLUE (TOWEL DISPOSABLE) ×2 IMPLANT
TRAY FOLEY W/BAG SLVR 14FR LF (SET/KITS/TRAYS/PACK) ×2 IMPLANT
TROCAR PORT AIRSEAL 5X120 (TROCAR) ×2 IMPLANT
TROCAR Z-THREAD FIOS 5X100MM (TROCAR) IMPLANT
WATER STERILE IRR 1000ML POUR (IV SOLUTION) ×2 IMPLANT

## 2022-07-21 NOTE — Discharge Summary (Addendum)
Physician Discharge Summary  Patient ID: Briana Fuentes MRN: [id number] DOB/AGE: 03-12-1975 48 y.o.  Admit date: 07/21/2022 Discharge date: 07/21/2022  Admission Diagnoses:  Discharge Diagnoses:  Principal Problem:   Post-operative state   Discharged Condition: good  Hospital Course: Uncomplicated robotic TLH, salpingectomies    Consults: None  Significant Diagnostic Studies: none  Treatments: surgery: Uncomplicated robotic TLH, salpingectomies  Discharge Exam: Blood pressure 128/72, pulse 91, temperature (P) 98.2 F (36.8 C), resp. rate 18, height 5\' 5"  (1.651 m), weight 65 kg, SpO2 100 %.   Disposition: Discharge disposition: 01-Home or Self Care       Discharge Instructions     Call MD for:  temperature >100.4   Complete by: As directed    Diet - low sodium heart healthy   Complete by: As directed    Discharge instructions   Complete by: As directed    No driving on narcotics, no sexual activity for 2 weeks.   Increase activity slowly   Complete by: As directed    May shower / Bathe   Complete by: As directed    Shower, no bath for 2 weeks.   Remove dressing in 24 hours   Complete by: As directed    Sexual Activity Restrictions   Complete by: As directed    No sexual activity for 2 weeks.      Allergies as of 07/21/2022       Reactions   Codeine Nausea Only        Medication List     STOP taking these medications    cephALEXin 500 MG capsule Commonly known as: Keflex   norethindrone 5 MG tablet Commonly known as: AYGESTIN       TAKE these medications    Integra Plus Caps Take 1 capsule by mouth daily.   levothyroxine 125 MCG tablet Commonly known as: SYNTHROID Take 125 mcg by mouth daily before breakfast.   metFORMIN 500 MG 24 hr tablet Commonly known as: GLUCOPHAGE-XR TAKE 1 TABLET BY MOUTH TWICE A DAY WITH MEALS What changed:  how much to take when to take this additional instructions   pregabalin 100 MG capsule Commonly  known as: LYRICA Take 150 mg by mouth daily.        Follow-up Information     Charlett Nose, MD Follow up in 1 week(s).   Specialty: Obstetrics and Gynecology Contact information: [address] [place] [address] [phone]                 Signed: Loney Laurence 07/21/2022, 1:11 PM

## 2022-07-21 NOTE — Transfer of Care (Signed)
Immediate Anesthesia Transfer of Care Note  Patient: Briana Fuentes  Procedure(s) Performed: Procedure(s) (LRB): XI ROBOTIC ASSISTED TOTAL HYSTERECTOMY WITH BILATERAL SALPINGECTOMY (N/A) CYSTOSCOPY (N/A)  Patient Location: PACU  Anesthesia Type: General  Level of Consciousness: awake, sedated, patient cooperative and responds to stimulation  Airway & Oxygen Therapy: Patient Spontanous Breathing and Patient connected to Nadine oxygen  Post-op Assessment: Report given to PACU RN, Post -op Vital signs reviewed and stable and Patient moving all extremities  Post vital signs: Reviewed and stable  Complications: No apparent anesthesia complications

## 2022-07-21 NOTE — Op Note (Signed)
07/21/2022  12:49 PM  PATIENT:  Briana Fuentes  47 y.o. female  PRE-OPERATIVE DIAGNOSIS:  leiomyoma of uterus  POST-OPERATIVE DIAGNOSIS:  leiomyoma of uterus  PROCEDURE:  Procedure(s): XI ROBOTIC ASSISTED TOTAL HYSTERECTOMY WITH BILATERAL SALPINGECTOMY (N/A) CYSTOSCOPY (N/A)  SURGEON:  Surgeon(s) and Role:    * Carrington Clamp, MD - Primary    * Charlett Nose, MD - Assisting  ANESTHESIA:   general  EBL:  300 mL   DRAINS: Urinary Catheter (Foley)   LOCAL MEDICATIONS USED:  OTHER Ropivicaine, arista, fluorescein  SPECIMEN:  Source of Specimen:  uterus, tubes, cervix  DISPOSITION OF SPECIMEN:  PATHOLOGY  COUNTS:  YES  TOURNIQUET:  * No tourniquets in log *  DICTATION: .Note written in EPIC  PLAN OF CARE: Admit for overnight observation  PATIENT DISPOSITION:  PACU - hemodynamically stable.   Delay start of Pharmacological VTE agent (>24hrs) due to surgical blood loss or risk of bleeding: not applicable  Complications:  None.  Findings:  14 weeks size uterus.  Ovaries were normal.  The ureters were identified during multiple points of the case and were always out of the field of dissection.  On cystoscopy, the bladder was intact and bilateral spill was seen from each ureteral oriface.     Technique:   After adequate anesthesia was achieved the patient was positioned, prepped and draped in usual sterile fashion.  A speculum was placed in the vagina and the cervix dilated with pratt dilators.  The 3 cm Koh ring Advincula was assembled and placed in proper fashion.  The  Speculum was removed and the bladder catheterized with a foley.     Attention was turned to the abdomen where a 1 cm incision was made 1 cm above the umbilicus.  The veress needle was introduced without aspiration of bowel contents or blood and the abdomen insufflated. The 8.5 mm Robotic trocar was placed and the other three trocar sites were marked out, all approximately 10 cm from each other and the  camera.  Two 8.5 mm trocars were placed on either side of the camera port and a 5 mm assistant port was placed 3 cm above the line of the other trocars.  All trocars were inserted under direct visualization of the camera.  The patient was placed in trendelenburg and then the Robot docked.  The fenestrated bipolar were placed on arm 1 and the Hot shears on arm 3 and introduced under direct visualization of the camera.   I then broke scrub and sat down at the console.  The above findings were noted and the ureters identified well out of the field of dissection.  The right fallopian tube was isolated and cauterized with the bipolar.  The Utero-ovarian ligament was then divided with the bipolar cautery and shears.  The posterior broad ligament was then divided with the hot shears until the uterosacral ligament.  The Broad and cardinal ligaments were then cauterized against the cervix to the level of the Koh ring, securing the uterine artery.  Each pedicle was then incised with the shears.  The anterior leaf was then incised at the reflection of the vessico-uterine junction and the lateral bladder retracted inferiorly after the round ligament had been divided with the bipolar forceps.  The left tube was cauterized with the bipolar and divided with the shears;  then the left utero-ovarian ligament divided with the bipolar forceps and the scissors.  The round ligament was divided as well and the posterior leaf of the broad  ligament then divided with the hot shears. The broad and cardinal ligaments were then cauterized on the left in the same way.   At the level of the internal os, the uterine arteries were bilaterally cauterized with the bipolar.  The ureters were identified well out of the field of dissection.  The cul de sac was obliterated and some dissection of the descending colon was necessary bluntly to remove the bowel away from the cervix.   The bladder was then able to be retracted inferiorly and the  vesico-uterine fascia was incised in the midline until the bladder was removed one cm below the Koh ring.  The hot shears then circumferentially incised the vagina at the level of the reflection on the [facility] ring.  Once the uterus and cervix were amputated, cautery was used to insure hemostasis of the cuff.  The uterus was divided with the hot shears in two and the large fibroid removed to allow the specimens to be removed from the vagina.  All three pieces of the uterus and fibroid were removed and the specimen weighed >>200 g.  Once hemostasis was achieved, the scissors were changed to the mega suture cut needle driver.  A two cm rent of the R anterior vagina was noted and repaired with a separate stitch of 3-0 V loc.  Once this was closed. the cuff was closed with a running stitches of 0-vicryl V loc in essentially two layers.. The ureters were peristalsing bilaterally well and very lateral to the areas of operation.     The Robot was then undocked and I scrubbed back in.  The needle was removed and Arista and Ropivicaine was introduced into the pelvis. The skin incisions were closed with subcuticular stitches of 3-0 vicryl Rapide and Dermabond.  All instruments were removed from the vagina and cystoscopy performed, revealing an intact bladder and vigourous spill of urine from each ureteral orifice.  The cystoscope was removed and the patient taken to the recovery room in stable condition.   Aldous Housel A

## 2022-07-21 NOTE — Anesthesia Procedure Notes (Signed)
Procedure Name: Intubation Date/Time: 07/21/2022 9:40 AM  Performed by: Jessica Priest, CRNAPre-anesthesia Checklist: Patient identified, Emergency Drugs available, Suction available and Patient being monitored Patient Re-evaluated:Patient Re-evaluated prior to induction Oxygen Delivery Method: Circle system utilized Preoxygenation: Pre-oxygenation with 100% oxygen Induction Type: IV induction Ventilation: Mask ventilation without difficulty Laryngoscope Size: Mac and 3 Grade View: Grade II Tube type: Oral Tube size: 7.0 mm Number of attempts: 1 Airway Equipment and Method: Stylet and Oral airway Placement Confirmation: ETT inserted through vocal cords under direct vision, positive ETCO2, breath sounds checked- equal and bilateral and CO2 detector Secured at: 21 cm Tube secured with: Tape Dental Injury: Teeth and Oropharynx as per pre-operative assessment

## 2022-07-21 NOTE — Anesthesia Postprocedure Evaluation (Signed)
Anesthesia Post Note  Patient: Briana Fuentes  Procedure(s) Performed: XI ROBOTIC ASSISTED TOTAL HYSTERECTOMY WITH BILATERAL SALPINGECTOMY (Abdomen) CYSTOSCOPY (Bladder)     Patient location during evaluation: PACU Anesthesia Type: General Level of consciousness: awake and alert Pain management: pain level controlled Vital Signs Assessment: post-procedure vital signs reviewed and stable Respiratory status: spontaneous breathing, nonlabored ventilation, respiratory function stable and patient connected to nasal cannula oxygen Cardiovascular status: blood pressure returned to baseline and stable Postop Assessment: no apparent nausea or vomiting Anesthetic complications: no  No notable events documented.  Last Vitals:  Vitals:   07/21/22 1344 07/21/22 1345  BP: 121/85 121/85  Pulse: 69 76  Resp: 13 13  Temp:    SpO2: 97% 99%    Last Pain:  Vitals:   07/21/22 1344  TempSrc:   PainSc: 10-Worst pain ever                 Karle Desrosier S

## 2022-07-21 NOTE — Discharge Instructions (Signed)
LAST DOSE OF PAIN MEDICATION WAS AT 3:00 PM TODAY

## 2022-07-21 NOTE — Brief Op Note (Signed)
07/21/2022  12:49 PM  PATIENT:  Briana Fuentes  47 y.o. female  PRE-OPERATIVE DIAGNOSIS:  leiomyoma of uterus  POST-OPERATIVE DIAGNOSIS:  leiomyoma of uterus  PROCEDURE:  Procedure(s): XI ROBOTIC ASSISTED TOTAL HYSTERECTOMY WITH BILATERAL SALPINGECTOMY (N/A) CYSTOSCOPY (N/A)  SURGEON:  Surgeon(s) and Role:    * Carrington Clamp, MD - Primary    * Charlett Nose, MD - Assisting  ANESTHESIA:   general  EBL:  300 mL   DRAINS: Urinary Catheter (Foley)   LOCAL MEDICATIONS USED:  OTHER Ropivicaine, arista, fluorescein  SPECIMEN:  Source of Specimen:  uterus, tubes, cervix  DISPOSITION OF SPECIMEN:  PATHOLOGY  COUNTS:  YES  TOURNIQUET:  * No tourniquets in log *  DICTATION: .Note written in EPIC  PLAN OF CARE: Admit for overnight observation  PATIENT DISPOSITION:  PACU - hemodynamically stable.   Delay start of Pharmacological VTE agent (>24hrs) due to surgical blood loss or risk of bleeding: not applicable

## 2022-07-21 NOTE — Anesthesia Preprocedure Evaluation (Signed)
Anesthesia Evaluation  Patient identified by MRN, date of birth, ID band Patient awake    Reviewed: Allergy & Precautions, H&P , NPO status , Patient's Chart, lab work & pertinent test results  Airway Mallampati: II  TM Distance: >3 FB Neck ROM: Full    Dental no notable dental hx.    Pulmonary neg pulmonary ROS   Pulmonary exam normal breath sounds clear to auscultation       Cardiovascular negative cardio ROS Normal cardiovascular exam Rhythm:Regular Rate:Normal     Neuro/Psych negative neurological ROS  negative psych ROS   GI/Hepatic negative GI ROS, Neg liver ROS,,,  Endo/Other  diabetes, Type 2Hypothyroidism    Renal/GU negative Renal ROS  negative genitourinary   Musculoskeletal negative musculoskeletal ROS (+)    Abdominal   Peds negative pediatric ROS (+)  Hematology negative hematology ROS (+)   Anesthesia Other Findings   Reproductive/Obstetrics negative OB ROS                             Anesthesia Physical Anesthesia Plan  ASA: 2  Anesthesia Plan: General   Post-op Pain Management: Tylenol PO (pre-op)*, Gabapentin PO (pre-op)* and Celebrex PO (pre-op)*   Induction: Intravenous  PONV Risk Score and Plan: 3 and Ondansetron, Dexamethasone, Droperidol, Midazolam and Treatment may vary due to age or medical condition  Airway Management Planned: Oral ETT  Additional Equipment:   Intra-op Plan:   Post-operative Plan: Extubation in OR  Informed Consent: I have reviewed the patients History and Physical, chart, labs and discussed the procedure including the risks, benefits and alternatives for the proposed anesthesia with the patient or authorized representative who has indicated his/her understanding and acceptance.     Dental advisory given  Plan Discussed with: CRNA and Surgeon  Anesthesia Plan Comments:        Anesthesia Quick Evaluation

## 2022-07-21 NOTE — Progress Notes (Signed)
There has been no change in the patients history, status or exam since the history and physical.  Vitals:   07/13/22 1343 07/21/22 0825  BP:  123/81  Pulse:  64  Resp:  16  Temp:  97.9 F (36.6 C)  TempSrc:  Oral  SpO2:  100%  Weight: 64.4 kg 65 kg  Height:  5\' 5"  (1.651 m)    Results for orders placed or performed during the hospital encounter of 07/21/22 (from the past 72 hour(s))  Pregnancy, urine POC     Status: None   Collection Time: 07/21/22  7:58 AM  Result Value Ref Range   Preg Test, Ur NEGATIVE NEGATIVE    Comment:        THE SENSITIVITY OF THIS METHODOLOGY IS >24 mIU/mL   CBC     Status: Abnormal   Collection Time: 07/21/22  8:35 AM  Result Value Ref Range   WBC 7.8 4.0 - 10.5 K/uL   RBC 4.16 3.87 - 5.11 MIL/uL   Hemoglobin 11.4 (L) 12.0 - 15.0 g/dL   HCT 16.1 09.6 - 04.5 %   MCV 88.2 80.0 - 100.0 fL   MCH 27.4 26.0 - 34.0 pg   MCHC 31.1 30.0 - 36.0 g/dL   RDW 40.9 (H) 81.1 - 91.4 %   Platelets 345 150 - 400 K/uL   nRBC 0.0 0.0 - 0.2 %    Comment: Performed at [facility], [address] [place], [place] [address]  Glucose, capillary     Status: Abnormal   Collection Time: 07/21/22  8:38 AM  Result Value Ref Range   Glucose-Capillary 202 (H) 70 - 99 mg/dL    Comment: Glucose reference range applies only to samples taken after fasting for at least 8 hours.    Briana Fuentes

## 2022-07-22 ENCOUNTER — Encounter (HOSPITAL_BASED_OUTPATIENT_CLINIC_OR_DEPARTMENT_OTHER): Payer: Self-pay | Admitting: Obstetrics and Gynecology

## 2022-07-22 LAB — SURGICAL PATHOLOGY

## 2022-07-26 ENCOUNTER — Ambulatory Visit: Payer: 59

## 2022-07-28 DIAGNOSIS — R300 Dysuria: Secondary | ICD-10-CM | POA: Diagnosis not present

## 2022-07-30 ENCOUNTER — Ambulatory Visit: Payer: 59

## 2022-08-04 DIAGNOSIS — R300 Dysuria: Secondary | ICD-10-CM | POA: Diagnosis not present

## 2022-08-05 ENCOUNTER — Ambulatory Visit (INDEPENDENT_AMBULATORY_CARE_PROVIDER_SITE_OTHER): Payer: 59

## 2022-08-05 VITALS — BP 116/74 | HR 74 | Temp 98.1°F | Resp 16 | Ht 65.0 in | Wt 138.2 lb

## 2022-08-05 DIAGNOSIS — N920 Excessive and frequent menstruation with regular cycle: Secondary | ICD-10-CM

## 2022-08-05 DIAGNOSIS — D500 Iron deficiency anemia secondary to blood loss (chronic): Secondary | ICD-10-CM | POA: Diagnosis not present

## 2022-08-05 MED ORDER — DIPHENHYDRAMINE HCL 25 MG PO CAPS: 25.0000 mg | ORAL_CAPSULE | Freq: Once | ORAL | Status: AC

## 2022-08-05 MED ORDER — ACETAMINOPHEN 325 MG PO TABS: 650.0000 mg | ORAL_TABLET | Freq: Once | ORAL | Status: AC

## 2022-08-05 MED ORDER — SODIUM CHLORIDE 0.9 % IV SOLN
250.0000 mg | INTRAVENOUS | Status: DC
  Administered 2022-08-05: 250 mg via INTRAVENOUS
  Filled 2022-08-05: qty 20

## 2022-08-05 NOTE — Progress Notes (Signed)
Diagnosis: Iron Deficiency Anemia  Provider:  Chilton Greathouse MD  Procedure: IV Infusion  IV Type: Peripheral, IV Location: L Hand  Ferrlecit (ferric gluconate), Dose: 250 mg  Infusion Start Time: 1100  Infusion Stop Time: 1307  Post Infusion IV Care: Peripheral IV Discontinued  Discharge: Condition: Good, Destination: Home . AVS Provided and AVS Declined  Performed by:  Adriana Mccallum, RN

## 2022-08-23 DIAGNOSIS — N760 Acute vaginitis: Secondary | ICD-10-CM | POA: Diagnosis not present

## 2022-08-23 DIAGNOSIS — R300 Dysuria: Secondary | ICD-10-CM | POA: Diagnosis not present

## 2022-08-23 DIAGNOSIS — R5383 Other fatigue: Secondary | ICD-10-CM | POA: Diagnosis not present

## 2022-09-08 NOTE — Progress Notes (Deleted)
Office Visit Note  Patient: Briana Fuentes             Date of Birth: 13-Apr-1975           MRN: [id number]             PCP: [facility] Referring: Manning Charity, OD Visit Date: 09/22/2022 Occupation: @GUAROCC @  Subjective:  No chief complaint on file.   History of Present Illness: Briana Fuentes is a 47 y.o. female ***seen in consultation per request of Dr. 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  She is currently on Durezol and Voltaren eyedrops.    Activities of Daily Living:  Patient reports morning stiffness for *** {minute/hour:19697}.   Patient {ACTIONS;DENIES/REPORTS:21021675::"Denies"} nocturnal pain.  Difficulty dressing/grooming: {ACTIONS;DENIES/REPORTS:21021675::"Denies"} Difficulty climbing stairs: {ACTIONS;DENIES/REPORTS:21021675::"Denies"} Difficulty getting out of chair: {ACTIONS;DENIES/REPORTS:21021675::"Denies"} Difficulty using hands for taps, buttons, cutlery, and/or writing: {ACTIONS;DENIES/REPORTS:21021675::"Denies"}  No Rheumatology ROS completed.   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 Talmage Nap   Infertility, female    Iron deficiency anemia secondary to blood loss (chronic)     due to menorrhagia, s/p iron infusions, most recent 06/2022, Dr. Arbutus Ped, Hematology   Neuromuscular disorder [facility])    Neuropathy    nerve pain   Toxic multinodular goiter 2006   right thyroid lobectomy 2006, recurrent hyperthyroidism with radioactive thyroid treatment, 12/2008 with Dr. Talmage Nap   Type 2 diabetes mellitus (HCC)    endocronogist, Dr. Talmage Nap    Family History  Problem Relation Age of Onset   Heart disease Father    Stroke Father    Diabetes Mellitus II Father    Kidney failure Father        on dialysis   Healthy Mother    Healthy Brother    Healthy Sister    Healthy Daughter    Healthy Son    Past Surgical History:  Procedure Laterality Date   CESAREAN SECTION N/A 08/10/2012   Procedure: CESAREAN SECTION;  Surgeon: Catalina Antigua, MD;  Location: [facility];  Service: Obstetrics;  Laterality: N/A;  Primary Cesarean Section Delivery Baby Girl @ 2126, Apgars 9/9   CYSTOSCOPY N/A 07/21/2022   Procedure: CYSTOSCOPY;  Surgeon: Carrington Clamp, MD;  Location: [facility];  Service: Gynecology;  Laterality: N/A;   ROBOTIC ASSISTED TOTAL HYSTERECTOMY WITH BILATERAL SALPINGO OOPHERECTOMY N/A 07/21/2022   Procedure: XI ROBOTIC ASSISTED TOTAL HYSTERECTOMY WITH BILATERAL SALPINGECTOMY;  Surgeon: Carrington Clamp, MD;  Location: [facility];  Service: Gynecology;  Laterality: N/A;   THYROIDECTOMY  2006   right thyroid lobectomy   Social History   Social History Narrative   She lives with husband, three children, and mother.     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     Immunization History  Administered Date(s) Administered   Influenza Split 05/01/2012   Influenza,inj,Quad PF,6+ Mos 12/02/2015   Influenza-Unspecified 01/23/2019   Tdap 08/11/2012     Objective: Vital Signs: There were no vitals taken for this visit.   Physical Exam   Musculoskeletal Exam: ***  CDAI Exam: CDAI  Score: -- Patient Global: --; Provider Global: -- Swollen: --; Tender: -- Joint Exam 09/22/2022   No joint exam has been documented for this visit   There is currently no information documented on the homunculus. Go to the Rheumatology activity and complete the homunculus joint exam.  Investigation: No additional findings.  Imaging: No results found.  Recent Labs: Lab Results  Component Value Date   WBC 7.8 07/21/2022   HGB 11.4 (L) 07/21/2022   PLT 345 07/21/2022   NA 134 (L) 07/21/2022   K 4.2 07/21/2022   CL 105 07/21/2022   CO2 18 (L) 07/21/2022   GLUCOSE 190 (H) 07/21/2022   BUN 14 07/21/2022   CREATININE 0.85 07/21/2022   BILITOT 0.3 06/05/2022   ALKPHOS 53 06/05/2022   AST 18 06/05/2022   ALT 14 06/05/2022   PROT 6.8 06/05/2022   ALBUMIN 3.5 06/05/2022   CALCIUM 8.6 (L) 07/21/2022   GFRAA >60 09/28/2018    Speciality Comments: No specialty comments available.  Procedures:  No procedures performed Allergies: Codeine   Assessment / Plan:     Visit Diagnoses: No diagnosis found.  Orders: No orders of the defined types were placed in this encounter.  No orders of the defined types were placed in this encounter.   Face-to-face time spent with patient was *** minutes. Greater than 50% of time was spent in counseling and coordination of care.  Follow-Up Instructions: No follow-ups on file.   Pollyann Savoy, MD  Note - This record has been created using Animal nutritionist.  Chart creation errors have been sought, but may not always  have been located. Such creation errors do not reflect on  the standard of medical care.

## 2022-09-09 DIAGNOSIS — N760 Acute vaginitis: Secondary | ICD-10-CM | POA: Diagnosis not present

## 2022-09-09 DIAGNOSIS — R339 Retention of urine, unspecified: Secondary | ICD-10-CM | POA: Diagnosis not present

## 2022-09-21 ENCOUNTER — Inpatient Hospital Stay: Payer: 59 | Attending: Internal Medicine

## 2022-09-21 ENCOUNTER — Inpatient Hospital Stay: Payer: 59 | Admitting: Internal Medicine

## 2022-09-22 ENCOUNTER — Encounter: Payer: BC Managed Care – PPO | Admitting: Rheumatology

## 2022-09-22 DIAGNOSIS — H0589 Other disorders of orbit: Secondary | ICD-10-CM

## 2022-09-22 DIAGNOSIS — H04129 Dry eye syndrome of unspecified lacrimal gland: Secondary | ICD-10-CM

## 2022-09-22 DIAGNOSIS — D500 Iron deficiency anemia secondary to blood loss (chronic): Secondary | ICD-10-CM

## 2022-09-22 DIAGNOSIS — Z8639 Personal history of other endocrine, nutritional and metabolic disease: Secondary | ICD-10-CM

## 2022-09-22 DIAGNOSIS — G8929 Other chronic pain: Secondary | ICD-10-CM

## 2022-09-22 DIAGNOSIS — E0590 Thyrotoxicosis, unspecified without thyrotoxic crisis or storm: Secondary | ICD-10-CM

## 2022-09-22 DIAGNOSIS — E0500 Thyrotoxicosis with diffuse goiter without thyrotoxic crisis or storm: Secondary | ICD-10-CM

## 2022-09-22 DIAGNOSIS — H15001 Unspecified scleritis, right eye: Secondary | ICD-10-CM

## 2022-10-11 DIAGNOSIS — R300 Dysuria: Secondary | ICD-10-CM | POA: Diagnosis not present

## 2022-10-13 ENCOUNTER — Telehealth: Payer: Self-pay

## 2022-10-13 NOTE — Telephone Encounter (Signed)
TC from pt, stating she is feeling very lethargic and wants an appt w/ Dr. Arbutus Ped. Dr. Arbutus Ped reports that she has missed several routine appts, including one on 09/21/22. He states he will see her whenever his next available appt is in approx two months. Scheduling notified. Pt advised that someone will call her to set this up.

## 2022-10-14 ENCOUNTER — Telehealth: Payer: Self-pay | Admitting: Internal Medicine

## 2022-10-21 ENCOUNTER — Ambulatory Visit: Payer: BC Managed Care – PPO | Admitting: Rheumatology

## 2022-10-26 NOTE — Progress Notes (Deleted)
[facility] OFFICE PROGRESS NOTE  Pa, [facility] [address], [address] [place] [address]  DIAGNOSIS: Severe iron deficiency anemia secondary to chronic gynecologic blood loss.   PRIOR THERAPY: 1) Iron infusion with Feraheme 510 mg IV weekly for 2 doses.  Last dose was given on May 04, 2020. 2) iron infusion with Venofer 300 Mg IV weekly for 3 weeks last dose was given August 31, 2021 3) IV iron with ferrlicit 250 mg weekly x4. Most recent dose on 08/05/22  CURRENT THERAPY: Oral iron tablet with Integra +1 capsule p.o. daily ***  INTERVAL HISTORY: Briana Fuentes 47 y.o. female returns to clinic today for follow-up visit.  The patient was last seen by Dr. Arbutus Ped on 06/22/22.  She was supposed to follow-up in 3 months but was lost to follow-up ***.  The patient has iron deficiency anemia secondary to menorrhagia secondary to fibroids..  She sees***  at***GYN.  The plan is to***.  She has menstrual cycles every***weeks lasting***days.  Spotting?  Clots?  Fibroids?  Pads and tampons?.  She had a hysterectomy by Dr. Henderson Cloud on 07/21/2022.  She had preoperative blood transfusion with 2 units of blood.  She receives IV iron on an as needed basis.  The most recent was with Ferrlecit 250 mg weekly.  She received 3 weekly doses and May 2024 and received her last dose on 08/05/2022 while she was ***.   She called the clinic on 10/13/22 endorsing feeling lethargic.[name]  Therefore she is here today for evaluation and repeat blood work.  She is ***compliant with her oral iron supplement?  She takes this ***daily?  Prescription over-the-counter?  Take with vitamin C?  Regarding symptoms she reports **fatigue, shortness of breath, lightheadedness, dyspnea on exertion.  Palpitations?  Abnormal bleeding or bruising?  Ever had a colonoscopy?  She is here today for repeat evaluation and blood work.*     MEDICAL HISTORY: Past Medical History:  Diagnosis Date    Anxiety    resolved   Gestational diabetes    Hypothyroidism    endocronologist, Dr. Talmage Nap   Infertility, female    Iron deficiency anemia secondary to blood loss (chronic)    due to menorrhagia, s/p iron infusions, most recent 06/2022, Dr. Arbutus Ped, Hematology   Neuromuscular disorder [facility])    Neuropathy    nerve pain   Toxic multinodular goiter 2006   right thyroid lobectomy 2006, recurrent hyperthyroidism with radioactive thyroid treatment, 12/2008 with Dr. Talmage Nap   Type 2 diabetes mellitus (HCC)    endocronogist, Dr. Talmage Nap    ALLERGIES:  is allergic to codeine.  MEDICATIONS:  Current Outpatient Medications  Medication Sig Dispense Refill   FeFum-FePoly-FA-B Cmp-C-Biot (INTEGRA PLUS) CAPS Take 1 capsule by mouth daily. (Patient not taking: Reported on 06/06/2022) 30 capsule 3   levothyroxine (SYNTHROID) 125 MCG tablet Take 125 mcg by mouth daily before breakfast.     metFORMIN (GLUCOPHAGE-XR) 500 MG 24 hr tablet TAKE 1 TABLET BY MOUTH TWICE A DAY WITH MEALS (Patient taking differently: Take 500-1,000 mg by mouth in the morning and at bedtime. Take 2 tablets (1000 mg) in the morning and Take 1 tablet (500 mg) at bedtime) 60 tablet 0   pregabalin (LYRICA) 100 MG capsule Take 150 mg by mouth daily.     Current Facility-Administered Medications  Medication Dose Route Frequency Provider Last Rate Last Admin   acetaminophen (TYLENOL) tablet 650 mg  650 mg Oral Once Si Gaul, MD  diphenhydrAMINE (BENADRYL) capsule 25 mg  25 mg Oral Once Si Gaul, MD        SURGICAL HISTORY:  Past Surgical History:  Procedure Laterality Date   CESAREAN SECTION N/A 08/10/2012   Procedure: CESAREAN SECTION;  Surgeon: Catalina Antigua, MD;  Location: [facility];  Service: Obstetrics;  Laterality: N/A;  Primary Cesarean Section Delivery Baby Girl @ 2126, Apgars 9/9   CYSTOSCOPY N/A 07/21/2022   Procedure: CYSTOSCOPY;  Surgeon: Carrington Clamp, MD;  Location: [facility];   Service: Gynecology;  Laterality: N/A;   ROBOTIC ASSISTED TOTAL HYSTERECTOMY WITH BILATERAL SALPINGO OOPHERECTOMY N/A 07/21/2022   Procedure: XI ROBOTIC ASSISTED TOTAL HYSTERECTOMY WITH BILATERAL SALPINGECTOMY;  Surgeon: Carrington Clamp, MD;  Location: [facility];  Service: Gynecology;  Laterality: N/A;   THYROIDECTOMY  2006   right thyroid lobectomy    REVIEW OF SYSTEMS:   Review of Systems  Constitutional: Negative for appetite change, chills, fatigue, fever and unexpected weight change.  HENT:   Negative for mouth sores, nosebleeds, sore throat and trouble swallowing.   Eyes: Negative for eye problems and icterus.  Respiratory: Negative for cough, hemoptysis, shortness of breath and wheezing.   Cardiovascular: Negative for chest pain and leg swelling.  Gastrointestinal: Negative for abdominal pain, constipation, diarrhea, nausea and vomiting.  Genitourinary: Negative for bladder incontinence, difficulty urinating, dysuria, frequency and hematuria.   Musculoskeletal: Negative for back pain, gait problem, neck pain and neck stiffness.  Skin: Negative for itching and rash.  Neurological: Negative for dizziness, extremity weakness, gait problem, headaches, light-headedness and seizures.  Hematological: Negative for adenopathy. Does not bruise/bleed easily.  Psychiatric/Behavioral: Negative for confusion, depression and sleep disturbance. The patient is not nervous/anxious.     PHYSICAL EXAMINATION:  There were no vitals taken for this visit.  ECOG PERFORMANCE STATUS: {CHL ONC ECOG [id number]  Physical Exam  Constitutional: Oriented to person, place, and time and well-developed, well-nourished, and in no distress. No distress.  HENT:  Head: Normocephalic and atraumatic.  Mouth/Throat: Oropharynx is clear and moist. No oropharyngeal exudate.  Eyes: Conjunctivae are normal. Right eye exhibits no discharge. Left eye exhibits no discharge. No scleral icterus.  Neck:  Normal range of motion. Neck supple.  Cardiovascular: Normal rate, regular rhythm, normal heart sounds and intact distal pulses.   Pulmonary/Chest: Effort normal and breath sounds normal. No respiratory distress. No wheezes. No rales.  Abdominal: Soft. Bowel sounds are normal. Exhibits no distension and no mass. There is no tenderness.  Musculoskeletal: Normal range of motion. Exhibits no edema.  Lymphadenopathy:    No cervical adenopathy.  Neurological: Alert and oriented to person, place, and time. Exhibits normal muscle tone. Gait normal. Coordination normal.  Skin: Skin is warm and dry. No rash noted. Not diaphoretic. No erythema. No pallor.  Psychiatric: Mood, memory and judgment normal.  Vitals reviewed.  LABORATORY DATA: Lab Results  Component Value Date   WBC 7.8 07/21/2022   HGB 11.4 (L) 07/21/2022   HCT 36.7 07/21/2022   MCV 88.2 07/21/2022   PLT 345 07/21/2022      Chemistry      Component Value Date/Time   NA 134 (L) 07/21/2022 0835   K 4.2 07/21/2022 0835   CL 105 07/21/2022 0835   CO2 18 (L) 07/21/2022 0835   BUN 14 07/21/2022 0835   CREATININE 0.85 07/21/2022 0835   CREATININE 0.71 04/22/2020 1037   CREATININE 0.51 05/01/2012 1104      Component Value Date/Time   CALCIUM 8.6 (L) 07/21/2022 4098  ALKPHOS 53 06/05/2022 0022   AST 18 06/05/2022 0022   AST 10 (L) 04/22/2020 1037   ALT 14 06/05/2022 0022   ALT 10 04/22/2020 1037   BILITOT 0.3 06/05/2022 0022   BILITOT 0.2 11/17/2021 1408   BILITOT 0.3 04/22/2020 1037       RADIOGRAPHIC STUDIES:  No results found.   ASSESSMENT/PLAN:  This is a very pleasant 47 year old African-American female with iron deficiency anemia secondary to menorrhagia secondary to fibroids.  She underwent hysterectomy on 07/21/2022.  She has received IV iron infusions in the past with, Venofer, and Ferrlecit.  She also takes an oral iron supplement ***  With***  The patient had repeat lab studies today with a CBC, CMP, iron  studies, ferritin.  Her labs  ***  I will arrange for additional IV iron infusions with Venofer 300 mg weekly x 3 at the [facility] infusion center.  We will arrange for her first dose of treatment to be approximately 1 week we will see her back for follow-up visit in 2 months for evaluation repeat blood work.  She will continue taking her oral iron supplement 1 tablet p.o. daily.  She will take her iron tablet with vitamin C to help iron absorption.  The patient was advised to call immediately if she has any concerning symptoms in the interval. The patient voices understanding of current disease status and treatment options and is in agreement with the current care plan. All questions were answered. The patient knows to call the clinic with any problems, questions or concerns. We can certainly see the patient much sooner if necessary   No orders of the defined types were placed in this encounter.    I spent {CHL ONC TIME VISIT - WUJWJ:[id number]} counseling the patient face to face. The total time spent in the appointment was {CHL ONC TIME VISIT - OZHYQ:[id number]}.  Johnnay Pleitez L Jenetta Wease, PA-C 10/26/22

## 2022-10-28 ENCOUNTER — Inpatient Hospital Stay: Payer: 59 | Admitting: Physician Assistant

## 2022-10-28 ENCOUNTER — Inpatient Hospital Stay: Payer: 59 | Attending: Internal Medicine

## 2022-10-28 DIAGNOSIS — Z90710 Acquired absence of both cervix and uterus: Secondary | ICD-10-CM | POA: Insufficient documentation

## 2022-10-28 DIAGNOSIS — Z9079 Acquired absence of other genital organ(s): Secondary | ICD-10-CM | POA: Insufficient documentation

## 2022-10-28 DIAGNOSIS — N920 Excessive and frequent menstruation with regular cycle: Secondary | ICD-10-CM | POA: Insufficient documentation

## 2022-10-28 DIAGNOSIS — Z90722 Acquired absence of ovaries, bilateral: Secondary | ICD-10-CM | POA: Insufficient documentation

## 2022-10-28 DIAGNOSIS — D500 Iron deficiency anemia secondary to blood loss (chronic): Secondary | ICD-10-CM | POA: Insufficient documentation

## 2022-10-28 LAB — IRON AND IRON BINDING CAPACITY (CC-WL,HP ONLY)
Iron: 82 ug/dL (ref 28–170)
Saturation Ratios: 24 % (ref 10.4–31.8)
TIBC: 344 ug/dL (ref 250–450)
UIBC: 262 ug/dL (ref 148–442)

## 2022-10-28 LAB — CBC WITH DIFFERENTIAL (CANCER CENTER ONLY)
Abs Immature Granulocytes: 0.06 10*3/uL (ref 0.00–0.07)
Basophils Absolute: 0 10*3/uL (ref 0.0–0.1)
Basophils Relative: 0 %
Eosinophils Absolute: 0.2 10*3/uL (ref 0.0–0.5)
Eosinophils Relative: 2 %
HCT: 35.5 % — ABNORMAL LOW (ref 36.0–46.0)
Hemoglobin: 11.9 g/dL — ABNORMAL LOW (ref 12.0–15.0)
Immature Granulocytes: 1 %
Lymphocytes Relative: 24 %
Lymphs Abs: 2.3 10*3/uL (ref 0.7–4.0)
MCH: 29 pg (ref 26.0–34.0)
MCHC: 33.5 g/dL (ref 30.0–36.0)
MCV: 86.4 fL (ref 80.0–100.0)
Monocytes Absolute: 0.5 10*3/uL (ref 0.1–1.0)
Monocytes Relative: 6 %
Neutro Abs: 6.4 10*3/uL (ref 1.7–7.7)
Neutrophils Relative %: 67 %
Platelet Count: 274 10*3/uL (ref 150–400)
RBC: 4.11 MIL/uL (ref 3.87–5.11)
RDW: 14.6 % (ref 11.5–15.5)
WBC Count: 9.5 10*3/uL (ref 4.0–10.5)
nRBC: 0 % (ref 0.0–0.2)

## 2022-10-28 LAB — FERRITIN: Ferritin: 79 ng/mL (ref 11–307)

## 2022-10-28 NOTE — Progress Notes (Deleted)
[facility] OFFICE PROGRESS NOTE  Pa, [facility] 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   PRIOR THERAPY: 1) Iron infusion with Feraheme 510 mg IV weekly for 2 doses.  Last dose was given on May 04, 2020. 2) iron infusion with Venofer 300 Mg IV weekly for 3 weeks last dose was given August 31, 2021 3) IV iron with ferrlicit 250 mg weekly x4. Most recent dose on 08/05/22  CURRENT THERAPY: Oral iron tablet with Integra +1 capsule p.o. daily ***  INTERVAL HISTORY: Briana Fuentes 47 y.o. female returns to clinic today for follow-up visit.  The patient was last seen by Dr. Arbutus Ped on 06/22/22.  She was supposed to follow-up in 3 months but was lost to follow-up ***.  The patient has iron deficiency anemia secondary to menorrhagia secondary to fibroids..  She sees***  at***GYN.  The plan is to***.  She has menstrual cycles every***weeks lasting***days.  Spotting?  Clots?  Fibroids?  Pads and tampons?.  She had a hysterectomy by Dr. Henderson Cloud on 07/21/2022.  She had preoperative blood transfusion with 2 units of blood.  She receives IV iron on an as needed basis.  The most recent was with Ferrlecit 250 mg weekly.  She received 3 weekly doses and May 2024 and received her last dose on 08/05/2022 while she was ***.   She called the clinic on 10/13/22 endorsing feeling lethargic.Briana Fuentes  Therefore she is here today for evaluation and repeat blood work.  She is ***compliant with her oral iron supplement?  She takes this ***daily?  Prescription over-the-counter?  Take with vitamin C?  Regarding symptoms she reports **fatigue, shortness of breath, lightheadedness, dyspnea on exertion.  Palpitations?  Abnormal bleeding or bruising?  Ever had a colonoscopy?  She is here today for repeat evaluation and blood work.*     MEDICAL HISTORY: Past Medical History:  Diagnosis Date    Anxiety    resolved   Gestational diabetes    Hypothyroidism    endocronologist, Dr. Talmage Nap   Infertility, female    Iron deficiency anemia secondary to blood loss (chronic)    due to menorrhagia, s/p iron infusions, most recent 06/2022, Dr. Arbutus Ped, Hematology   Neuromuscular disorder [facility])    Neuropathy    nerve pain   Toxic multinodular goiter 2006   right thyroid lobectomy 2006, recurrent hyperthyroidism with radioactive thyroid treatment, 12/2008 with Dr. Talmage Nap   Type 2 diabetes mellitus (HCC)    endocronogist, Dr. Talmage Nap    ALLERGIES:  is allergic to codeine.  MEDICATIONS:  Current Outpatient Medications  Medication Sig Dispense Refill   FeFum-FePoly-FA-B Cmp-C-Biot (INTEGRA PLUS) CAPS Take 1 capsule by mouth daily. (Patient not taking: Reported on 06/06/2022) 30 capsule 3   levothyroxine (SYNTHROID) 125 MCG tablet Take 125 mcg by mouth daily before breakfast.     metFORMIN (GLUCOPHAGE-XR) 500 MG 24 hr tablet TAKE 1 TABLET BY MOUTH TWICE A DAY WITH MEALS (Patient taking differently: Take 500-1,000 mg by mouth in the morning and at bedtime. Take 2 tablets (1000 mg) in the morning and Take 1 tablet (500 mg) at bedtime) 60 tablet 0   pregabalin (LYRICA) 100 MG capsule Take 150 mg by mouth daily.     Current Facility-Administered Medications  Medication Dose Route Frequency Provider Last Rate Last Admin   acetaminophen (TYLENOL) tablet 650 mg  650 mg Oral Once Si Gaul, MD  diphenhydrAMINE (BENADRYL) capsule 25 mg  25 mg Oral Once Si Gaul, MD        SURGICAL HISTORY:  Past Surgical History:  Procedure Laterality Date   CESAREAN SECTION N/A 08/10/2012   Procedure: CESAREAN SECTION;  Surgeon: Catalina Antigua, MD;  Location: [facility];  Service: Obstetrics;  Laterality: N/A;  Primary Cesarean Section Delivery Baby Girl @ 2126, Apgars 9/9   CYSTOSCOPY N/A 07/21/2022   Procedure: CYSTOSCOPY;  Surgeon: Carrington Clamp, MD;  Location: [facility];   Service: Gynecology;  Laterality: N/A;   ROBOTIC ASSISTED TOTAL HYSTERECTOMY WITH BILATERAL SALPINGO OOPHERECTOMY N/A 07/21/2022   Procedure: XI ROBOTIC ASSISTED TOTAL HYSTERECTOMY WITH BILATERAL SALPINGECTOMY;  Surgeon: Carrington Clamp, MD;  Location: [facility];  Service: Gynecology;  Laterality: N/A;   THYROIDECTOMY  2006   right thyroid lobectomy    REVIEW OF SYSTEMS:   Review of Systems  Constitutional: Negative for appetite change, chills, fatigue, fever and unexpected weight change.  HENT:   Negative for mouth sores, nosebleeds, sore throat and trouble swallowing.   Eyes: Negative for eye problems and icterus.  Respiratory: Negative for cough, hemoptysis, shortness of breath and wheezing.   Cardiovascular: Negative for chest pain and leg swelling.  Gastrointestinal: Negative for abdominal pain, constipation, diarrhea, nausea and vomiting.  Genitourinary: Negative for bladder incontinence, difficulty urinating, dysuria, frequency and hematuria.   Musculoskeletal: Negative for back pain, gait problem, neck pain and neck stiffness.  Skin: Negative for itching and rash.  Neurological: Negative for dizziness, extremity weakness, gait problem, headaches, light-headedness and seizures.  Hematological: Negative for adenopathy. Does not bruise/bleed easily.  Psychiatric/Behavioral: Negative for confusion, depression and sleep disturbance. The patient is not nervous/anxious.     PHYSICAL EXAMINATION:  There were no vitals taken for this visit.  ECOG PERFORMANCE STATUS: {CHL ONC ECOG [id number]  Physical Exam  Constitutional: Oriented to person, place, and time and well-developed, well-nourished, and in no distress. No distress.  HENT:  Head: Normocephalic and atraumatic.  Mouth/Throat: Oropharynx is clear and moist. No oropharyngeal exudate.  Eyes: Conjunctivae are normal. Right eye exhibits no discharge. Left eye exhibits no discharge. No scleral icterus.  Neck:  Normal range of motion. Neck supple.  Cardiovascular: Normal rate, regular rhythm, normal heart sounds and intact distal pulses.   Pulmonary/Chest: Effort normal and breath sounds normal. No respiratory distress. No wheezes. No rales.  Abdominal: Soft. Bowel sounds are normal. Exhibits no distension and no mass. There is no tenderness.  Musculoskeletal: Normal range of motion. Exhibits no edema.  Lymphadenopathy:    No cervical adenopathy.  Neurological: Alert and oriented to person, place, and time. Exhibits normal muscle tone. Gait normal. Coordination normal.  Skin: Skin is warm and dry. No rash noted. Not diaphoretic. No erythema. No pallor.  Psychiatric: Mood, memory and judgment normal.  Vitals reviewed.  LABORATORY DATA: Lab Results  Component Value Date   WBC 9.5 10/28/2022   HGB 11.9 (L) 10/28/2022   HCT 35.5 (L) 10/28/2022   MCV 86.4 10/28/2022   PLT 274 10/28/2022      Chemistry      Component Value Date/Time   NA 134 (L) 07/21/2022 0835   K 4.2 07/21/2022 0835   CL 105 07/21/2022 0835   CO2 18 (L) 07/21/2022 0835   BUN 14 07/21/2022 0835   CREATININE 0.85 07/21/2022 0835   CREATININE 0.71 04/22/2020 1037   CREATININE 0.51 05/01/2012 1104      Component Value Date/Time   CALCIUM 8.6 (L) 07/21/2022  0835   ALKPHOS 53 06/05/2022 0022   AST 18 06/05/2022 0022   AST 10 (L) 04/22/2020 1037   ALT 14 06/05/2022 0022   ALT 10 04/22/2020 1037   BILITOT 0.3 06/05/2022 0022   BILITOT 0.2 11/17/2021 1408   BILITOT 0.3 04/22/2020 1037       RADIOGRAPHIC STUDIES:  No results found.   ASSESSMENT/PLAN:  This is a very pleasant 47 year old African-American female with iron deficiency anemia secondary to menorrhagia secondary to fibroids.  She underwent hysterectomy on 07/21/2022.  She has received IV iron infusions in the past with, Venofer, and Ferrlecit.  She also takes an oral iron supplement ***  With***  The patient had repeat lab studies today with a CBC, CMP,  iron studies, ferritin.  Her labs  ***  I will arrange for additional IV iron infusions with Venofer 300 mg weekly x 3 at the [facility] infusion center.  We will arrange for her first dose of treatment to be approximately 1 week we will see her back for follow-up visit in 2 months for evaluation repeat blood work.  She will continue taking her oral iron supplement 1 tablet p.o. daily.  She will take her iron tablet with vitamin C to help iron absorption.  The patient was advised to call immediately if she has any concerning symptoms in the interval. The patient voices understanding of current disease status and treatment options and is in agreement with the current care plan. All questions were answered. The patient knows to call the clinic with any problems, questions or concerns. We can certainly see the patient much sooner if necessary   No orders of the defined types were placed in this encounter.    I spent {CHL ONC TIME VISIT - BMWUX:[id number]} counseling the patient face to face. The total time spent in the appointment was {CHL ONC TIME VISIT - ZDGUY:[id number]}.  Lari Linson L Liannah Yarbough, PA-C 10/28/22

## 2022-10-29 ENCOUNTER — Inpatient Hospital Stay: Payer: 59 | Admitting: Physician Assistant

## 2022-10-29 DIAGNOSIS — E1142 Type 2 diabetes mellitus with diabetic polyneuropathy: Secondary | ICD-10-CM | POA: Diagnosis not present

## 2022-11-05 ENCOUNTER — Ambulatory Visit: Payer: Self-pay | Admitting: Podiatry

## 2022-11-09 ENCOUNTER — Telehealth: Payer: Self-pay | Admitting: Internal Medicine

## 2022-11-09 NOTE — Telephone Encounter (Signed)
Rescheduled missed appointment, patient has been rescheduled to 09/18. Patient is notified.

## 2022-11-10 ENCOUNTER — Other Ambulatory Visit: Payer: 59

## 2022-11-10 ENCOUNTER — Inpatient Hospital Stay (HOSPITAL_BASED_OUTPATIENT_CLINIC_OR_DEPARTMENT_OTHER): Payer: 59 | Admitting: Internal Medicine

## 2022-11-10 VITALS — BP 118/83 | HR 76 | Temp 98.2°F | Resp 18 | Ht 65.0 in | Wt 141.7 lb

## 2022-11-10 DIAGNOSIS — Z90722 Acquired absence of ovaries, bilateral: Secondary | ICD-10-CM | POA: Diagnosis not present

## 2022-11-10 DIAGNOSIS — N920 Excessive and frequent menstruation with regular cycle: Secondary | ICD-10-CM | POA: Diagnosis not present

## 2022-11-10 DIAGNOSIS — D500 Iron deficiency anemia secondary to blood loss (chronic): Secondary | ICD-10-CM

## 2022-11-10 DIAGNOSIS — Z9079 Acquired absence of other genital organ(s): Secondary | ICD-10-CM | POA: Diagnosis not present

## 2022-11-10 DIAGNOSIS — Z90710 Acquired absence of both cervix and uterus: Secondary | ICD-10-CM | POA: Diagnosis not present

## 2022-11-10 NOTE — Progress Notes (Signed)
[facility] [phone]   [phone]  OFFICE PROGRESS NOTE  Pa, [facility] [address], [address] [place] [address]  DIAGNOSIS: severe iron deficiency anemia secondary to chronic gynecologic blood loss.  PRIOR THERAPY:  1) Iron infusion with Feraheme 510 mg IV weekly for 2 doses.  Last dose was given on May 04, 2020. 2) iron infusion with Venofer 300 Mg IV weekly for 3 weeks last dose was given in May 2024.  CURRENT THERAPY: Oral iron tablet with Integra +1 capsule p.o. daily  INTERVAL HISTORY: Briana Fuentes 47 y.o. female returns to the clinic today for follow-up visit.  The patient missed few appointments recently because of hospitalization for gynecological vaginal bleeding that persisted for more than a months.  She underwent total hysterectomy with bilateral salpingectomy for leiomyoma of the uterus.  She is recovering slowly from her surgery.  She received 2 units of PRBCs transfusion during her hospitalization.  She denied having any current chest pain, shortness of breath, cough or hemoptysis.  She has no nausea, vomiting, diarrhea or constipation.  She has no headache or visual changes.  She is here today for evaluation and repeat blood work.   MEDICAL HISTORY: Past Medical History:  Diagnosis Date   Anxiety    resolved   Gestational diabetes    Hypothyroidism    endocronologist, Dr. Talmage Nap   Infertility, female    Iron deficiency anemia secondary to blood loss (chronic)    due to menorrhagia, s/p iron infusions, most recent 06/2022, Dr. Arbutus Ped, Hematology   Neuromuscular disorder [facility])    Neuropathy    nerve pain   Toxic multinodular goiter 2006   right thyroid lobectomy 2006, recurrent hyperthyroidism with radioactive thyroid treatment, 12/2008 with Dr. Talmage Nap   Type 2 diabetes mellitus (HCC)    endocronogist, Dr. Talmage Nap    ALLERGIES:  is allergic to codeine.  MEDICATIONS:  Current Outpatient  Medications  Medication Sig Dispense Refill   FeFum-FePoly-FA-B Cmp-C-Biot (INTEGRA PLUS) CAPS Take 1 capsule by mouth daily. (Patient not taking: Reported on 06/06/2022) 30 capsule 3   levothyroxine (SYNTHROID) 125 MCG tablet Take 125 mcg by mouth daily before breakfast.     metFORMIN (GLUCOPHAGE-XR) 500 MG 24 hr tablet TAKE 1 TABLET BY MOUTH TWICE A DAY WITH MEALS (Patient taking differently: Take 500-1,000 mg by mouth in the morning and at bedtime. Take 2 tablets (1000 mg) in the morning and Take 1 tablet (500 mg) at bedtime) 60 tablet 0   pregabalin (LYRICA) 100 MG capsule Take 150 mg by mouth daily.     Current Facility-Administered Medications  Medication Dose Route Frequency Provider Last Rate Last Admin   acetaminophen (TYLENOL) tablet 650 mg  650 mg Oral Once Si Gaul, MD       diphenhydrAMINE (BENADRYL) capsule 25 mg  25 mg Oral Once Si Gaul, MD        SURGICAL HISTORY:  Past Surgical History:  Procedure Laterality Date   CESAREAN SECTION N/A 08/10/2012   Procedure: CESAREAN SECTION;  Surgeon: Catalina Antigua, MD;  Location: [facility];  Service: Obstetrics;  Laterality: N/A;  Primary Cesarean Section Delivery Baby Girl @ 2126, Apgars 9/9   CYSTOSCOPY N/A 07/21/2022   Procedure: CYSTOSCOPY;  Surgeon: Carrington Clamp, MD;  Location: [facility];  Service: Gynecology;  Laterality: N/A;   ROBOTIC ASSISTED TOTAL HYSTERECTOMY WITH BILATERAL SALPINGO OOPHERECTOMY N/A 07/21/2022   Procedure: XI ROBOTIC ASSISTED TOTAL HYSTERECTOMY WITH BILATERAL  SALPINGECTOMY;  Surgeon: Carrington Clamp, MD;  Location: [facility];  Service: Gynecology;  Laterality: N/A;   THYROIDECTOMY  2006   right thyroid lobectomy    REVIEW OF SYSTEMS:  A comprehensive review of systems was negative except for: Constitutional: positive for fatigue   PHYSICAL EXAMINATION: General appearance: alert, cooperative, fatigued, and no distress Head: Normocephalic, without obvious  abnormality, atraumatic Neck: no adenopathy, no JVD, supple, symmetrical, trachea midline, and thyroid not enlarged, symmetric, no tenderness/mass/nodules Lymph nodes: Cervical, supraclavicular, and axillary nodes normal. Resp: clear to auscultation bilaterally Back: symmetric, no curvature. ROM normal. No CVA tenderness. Cardio: regular rate and rhythm, S1, S2 normal, no murmur, click, rub or gallop GI: soft, non-tender; bowel sounds normal; no masses,  no organomegaly Extremities: extremities normal, atraumatic, no cyanosis or edema  ECOG PERFORMANCE STATUS: 1 - Symptomatic but completely ambulatory  Blood pressure 118/83, pulse 76, temperature 98.2 F (36.8 C), temperature source Oral, resp. rate 18, height 5\' 5"  (1.651 m), weight 141 lb 11.2 oz (64.3 kg), SpO2 99%.  LABORATORY DATA: Lab Results  Component Value Date   WBC 9.5 10/28/2022   HGB 11.9 (L) 10/28/2022   HCT 35.5 (L) 10/28/2022   MCV 86.4 10/28/2022   PLT 274 10/28/2022      Chemistry      Component Value Date/Time   NA 134 (L) 07/21/2022 0835   K 4.2 07/21/2022 0835   CL 105 07/21/2022 0835   CO2 18 (L) 07/21/2022 0835   BUN 14 07/21/2022 0835   CREATININE 0.85 07/21/2022 0835   CREATININE 0.71 04/22/2020 1037   CREATININE 0.51 05/01/2012 1104      Component Value Date/Time   CALCIUM 8.6 (L) 07/21/2022 0835   ALKPHOS 53 06/05/2022 0022   AST 18 06/05/2022 0022   AST 10 (L) 04/22/2020 1037   ALT 14 06/05/2022 0022   ALT 10 04/22/2020 1037   BILITOT 0.3 06/05/2022 0022   BILITOT 0.2 11/17/2021 1408   BILITOT 0.3 04/22/2020 1037       RADIOGRAPHIC STUDIES: No results found.  ASSESSMENT AND PLAN: This is a very pleasant 47 years old female with persistent iron deficiency anemia secondary to menorrhagia.  The patient was treated recently with iron infusion with Feraheme 510 mg IV weekly for 2 doses.  She also recently received iron infusion with Venofer 300 Mg IV weekly for 3 weeks last dose was given in  July 2023. She received iron infusion with Venofer in May 2024 The patient has been tolerating her treatment with iron infusion fairly well.  She recently underwent total abdominal hysterectomy with bilateral salpingectomy.  Her anemia is improving. Repeat CBC recently showed hemoglobin of 11.9 and hematocrit 35.5% with normal MCV.  She also has normal iron study and ferritin. I recommended for the patient to continue on Integra +1 capsule p.o. daily. I will see her back for follow-up visit in 3 months for evaluation and repeat blood work. She was advised to call immediately if she has any concerning symptoms in the interval. The patient voices understanding of current disease status and treatment options and is in agreement with the current care plan.  All questions were answered. The patient knows to call the clinic with any problems, questions or concerns. We can certainly see the patient much sooner if necessary.  Disclaimer: This note was dictated with voice recognition software. Similar sounding words can inadvertently be transcribed and may not be corrected upon review.

## 2023-01-19 DIAGNOSIS — E039 Hypothyroidism, unspecified: Secondary | ICD-10-CM | POA: Diagnosis not present

## 2023-01-19 DIAGNOSIS — E785 Hyperlipidemia, unspecified: Secondary | ICD-10-CM | POA: Diagnosis not present

## 2023-01-19 DIAGNOSIS — E1165 Type 2 diabetes mellitus with hyperglycemia: Secondary | ICD-10-CM | POA: Diagnosis not present

## 2023-01-19 DIAGNOSIS — E0500 Thyrotoxicosis with diffuse goiter without thyrotoxic crisis or storm: Secondary | ICD-10-CM | POA: Diagnosis not present

## 2023-01-19 DIAGNOSIS — G609 Hereditary and idiopathic neuropathy, unspecified: Secondary | ICD-10-CM | POA: Diagnosis not present

## 2023-01-19 DIAGNOSIS — D649 Anemia, unspecified: Secondary | ICD-10-CM | POA: Diagnosis not present

## 2023-01-19 DIAGNOSIS — E559 Vitamin D deficiency, unspecified: Secondary | ICD-10-CM | POA: Diagnosis not present

## 2023-01-19 DIAGNOSIS — M109 Gout, unspecified: Secondary | ICD-10-CM | POA: Diagnosis not present

## 2023-02-22 ENCOUNTER — Telehealth: Payer: Self-pay | Admitting: Internal Medicine

## 2023-02-22 NOTE — Telephone Encounter (Signed)
Patient is aware of rescheduled appointment dates/times 

## 2023-03-02 ENCOUNTER — Ambulatory Visit: Payer: 59 | Admitting: Internal Medicine

## 2023-03-02 ENCOUNTER — Other Ambulatory Visit: Payer: 59

## 2023-03-09 ENCOUNTER — Inpatient Hospital Stay: Payer: 59 | Admitting: Internal Medicine

## 2023-03-09 ENCOUNTER — Inpatient Hospital Stay: Payer: 59 | Attending: Internal Medicine

## 2023-03-09 VITALS — BP 140/94 | HR 80 | Temp 97.6°F | Resp 16 | Ht 65.0 in | Wt 147.3 lb

## 2023-03-09 DIAGNOSIS — D500 Iron deficiency anemia secondary to blood loss (chronic): Secondary | ICD-10-CM | POA: Insufficient documentation

## 2023-03-09 DIAGNOSIS — N920 Excessive and frequent menstruation with regular cycle: Secondary | ICD-10-CM | POA: Insufficient documentation

## 2023-03-09 LAB — CBC WITH DIFFERENTIAL (CANCER CENTER ONLY)
Abs Immature Granulocytes: 0.05 10*3/uL (ref 0.00–0.07)
Basophils Absolute: 0.1 10*3/uL (ref 0.0–0.1)
Basophils Relative: 1 %
Eosinophils Absolute: 0.3 10*3/uL (ref 0.0–0.5)
Eosinophils Relative: 2 %
HCT: 37.5 % (ref 36.0–46.0)
Hemoglobin: 12.6 g/dL (ref 12.0–15.0)
Immature Granulocytes: 0 %
Lymphocytes Relative: 29 %
Lymphs Abs: 3.3 10*3/uL (ref 0.7–4.0)
MCH: 29.7 pg (ref 26.0–34.0)
MCHC: 33.6 g/dL (ref 30.0–36.0)
MCV: 88.4 fL (ref 80.0–100.0)
Monocytes Absolute: 0.7 10*3/uL (ref 0.1–1.0)
Monocytes Relative: 6 %
Neutro Abs: 7 10*3/uL (ref 1.7–7.7)
Neutrophils Relative %: 62 %
Platelet Count: 279 10*3/uL (ref 150–400)
RBC: 4.24 MIL/uL (ref 3.87–5.11)
RDW: 12.5 % (ref 11.5–15.5)
WBC Count: 11.3 10*3/uL — ABNORMAL HIGH (ref 4.0–10.5)
nRBC: 0 % (ref 0.0–0.2)

## 2023-03-09 LAB — FERRITIN: Ferritin: 32 ng/mL (ref 11–307)

## 2023-03-09 LAB — IRON AND IRON BINDING CAPACITY (CC-WL,HP ONLY)
Iron: 40 ug/dL (ref 28–170)
Saturation Ratios: 10 % — ABNORMAL LOW (ref 10.4–31.8)
TIBC: 391 ug/dL (ref 250–450)
UIBC: 351 ug/dL (ref 148–442)

## 2023-03-09 NOTE — Progress Notes (Signed)
 [facility] [phone]   Fax:(336) [phone]  OFFICE PROGRESS NOTE  Pa, [facility] [address], [address] [place] [address]  DIAGNOSIS: severe iron  deficiency anemia secondary to chronic gynecologic blood loss.  PRIOR THERAPY:  1) Iron  infusion with Feraheme  510 mg IV weekly for 2 doses.  Last dose was given on May 04, 2020. 2) iron  infusion with Venofer  300 Mg IV weekly for 3 weeks last dose was given in May 2024.  CURRENT THERAPY: Oral iron  tablet with Integra +1 capsule p.o. daily.  INTERVAL HISTORY: Briana Fuentes 48 y.o. female returns to the clinic today for follow-up visit accompanied by her husband.Discussed the use of AI scribe software for clinical note transcription with the patient, who gave verbal consent to proceed.  History of Present Illness   The 48 year old patient has a history of iron  deficiency anemia, primarily due to heavy menstrual bleeding. She has received multiple iron  infusions in the past, including Feraheme  and Venofer , with the most recent infusion in May 2024. The patient underwent a hysterectomy on Jul 21, 2022. Since the procedure, she reports feeling generally well, but still experiences some fatigue and occasional restless leg syndrome at night.  The patient has been prescribed iron  capsules, but reports constipation as a side effect, which has led to inconsistent adherence. She has attempted to manage this with prune juice. The patient also reports elevated uric acid levels, which has led to a reluctance to increase her intake of red meat, a dietary source of iron .  In addition to her anemia, the patient has been experiencing persistent pain in her feet, which she has been managing with Lyrica  by her neurologist for the past seven years. The patient reports that the pain and weakness in her legs have not improved despite increasing doses of Lyrica  and regular electric treatments. She  expresses dissatisfaction with her current treatment and has considered seeking medical care in [place].        MEDICAL HISTORY: Past Medical History:  Diagnosis Date   Anxiety    resolved   Gestational diabetes    Hypothyroidism    endocronologist, Dr. Ronelle Coffee   Infertility, female    Iron  deficiency anemia secondary to blood loss (chronic)    due to menorrhagia, s/p iron  infusions, most recent 06/2022, Dr. Marguerita Shih, Hematology   Neuromuscular disorder [facility])    Neuropathy    nerve pain   Toxic multinodular goiter 2006   right thyroid  lobectomy 2006, recurrent hyperthyroidism with radioactive thyroid  treatment, 12/2008 with Dr. Ronelle Coffee   Type 2 diabetes mellitus (HCC)    endocronogist, Dr. Ronelle Coffee    ALLERGIES:  is allergic to codeine.  MEDICATIONS:  Current Outpatient Medications  Medication Sig Dispense Refill   FeFum-FePoly-FA-B Cmp-C-Biot (INTEGRA PLUS ) CAPS Take 1 capsule by mouth daily. (Patient not taking: Reported on 06/06/2022) 30 capsule 3   levothyroxine  (SYNTHROID ) 125 MCG tablet Take 125 mcg by mouth daily before breakfast.     metFORMIN  (GLUCOPHAGE -XR) 500 MG 24 hr tablet TAKE 1 TABLET BY MOUTH TWICE A DAY WITH MEALS (Patient taking differently: Take 500-1,000 mg by mouth in the morning and at bedtime. Take 2 tablets (1000 mg) in the morning and Take 1 tablet (500 mg) at bedtime) 60 tablet 0   pregabalin  (LYRICA ) 100 MG capsule Take 150 mg by mouth daily.     Current Facility-Administered Medications  Medication Dose Route Frequency Provider Last Rate Last Admin   acetaminophen  (TYLENOL ) tablet  650 mg  650 mg Oral Once Marlene Simas, MD       diphenhydrAMINE  (BENADRYL ) capsule 25 mg  25 mg Oral Once Marlene Simas, MD        SURGICAL HISTORY:  Past Surgical History:  Procedure Laterality Date   CESAREAN SECTION N/A 08/10/2012   Procedure: CESAREAN SECTION;  Surgeon: Verlyn Goad, MD;  Location: [facility];  Service: Obstetrics;  Laterality: N/A;  Primary Cesarean  Section Delivery Baby Girl @ 2126, Apgars 9/9   CYSTOSCOPY N/A 07/21/2022   Procedure: CYSTOSCOPY;  Surgeon: Matt Song, MD;  [facility];  Service: Gynecology;  Laterality: N/A;   ROBOTIC ASSISTED TOTAL HYSTERECTOMY WITH BILATERAL SALPINGO OOPHERECTOMY N/A 07/21/2022   Procedure: XI ROBOTIC ASSISTED TOTAL HYSTERECTOMY WITH BILATERAL SALPINGECTOMY;  Surgeon: Matt Song, MD;  Location: [facility];  Service: Gynecology;  Laterality: N/A;   THYROIDECTOMY  2006   right thyroid  lobectomy    REVIEW OF SYSTEMS:  A comprehensive review of systems was negative except for: Constitutional: positive for fatigue Neurological: positive for paresthesia   PHYSICAL EXAMINATION: General appearance: alert, cooperative, fatigued, and no distress Head: Normocephalic, without obvious abnormality, atraumatic Neck: no adenopathy, no JVD, supple, symmetrical, trachea midline, and thyroid  not enlarged, symmetric, no tenderness/mass/nodules Lymph nodes: Cervical, supraclavicular, and axillary nodes normal. Resp: clear to auscultation bilaterally Back: symmetric, no curvature. ROM normal. No CVA tenderness. Cardio: regular rate and rhythm, S1, S2 normal, no murmur, click, rub or gallop GI: soft, non-tender; bowel sounds normal; no masses,  no organomegaly Extremities: extremities normal, atraumatic, no cyanosis or edema  ECOG PERFORMANCE STATUS: 1 - Symptomatic but completely ambulatory  Blood pressure (!) 140/94, pulse 80, temperature 97.6 F (36.4 C), resp. rate 16, height 5\' 5"  (1.651 m), weight 147 lb 4.8 oz (66.8 kg), SpO2 100%.  LABORATORY DATA: Lab Results  Component Value Date   WBC 11.3 (H) 03/09/2023   HGB 12.6 03/09/2023   HCT 37.5 03/09/2023   MCV 88.4 03/09/2023   PLT 279 03/09/2023      Chemistry      Component Value Date/Time   NA 134 (L) 07/21/2022 0835   K 4.2 07/21/2022 0835   CL 105 07/21/2022 0835   CO2 18 (L) 07/21/2022 0835    BUN 14 07/21/2022 0835   CREATININE 0.85 07/21/2022 0835   CREATININE 0.71 04/22/2020 1037   CREATININE 0.51 05/01/2012 1104      Component Value Date/Time   CALCIUM 8.6 (L) 07/21/2022 0835   ALKPHOS 53 06/05/2022 0022   AST 18 06/05/2022 0022   AST 10 (L) 04/22/2020 1037   ALT 14 06/05/2022 0022   ALT 10 04/22/2020 1037   BILITOT 0.3 06/05/2022 0022   BILITOT 0.2 11/17/2021 1408   BILITOT 0.3 04/22/2020 1037       RADIOGRAPHIC STUDIES: No results found.  ASSESSMENT AND PLAN: This is a very pleasant 48 years old female with persistent iron  deficiency anemia secondary to menorrhagia.  The patient was treated recently with iron  infusion with Feraheme  510 mg IV weekly for 2 doses.  She also recently received iron  infusion with Venofer  300 Mg IV weekly for 3 weeks last dose was given in July 2023. She received iron  infusion with Venofer  in May 2024 The patient has been tolerating her treatment with iron  infusion fairly well.  She recently underwent total abdominal hysterectomy with bilateral salpingectomy.  Her anemia is improving.    Iron  Deficiency Anemia Iron  deficiency anemia secondary to heavy menstrual bleeding, status  post-hysterectomy on Jul 21, 2022. Reports lower leg tiredness, occasional numbness, and restless legs at night. Hemoglobin is 12.6. Iron  studies show improvement but saturation is slightly low at 10%. Experiences constipation with iron  capsules. Discussed risks of constipation with iron  capsules and benefits of dietary iron  intake. Advised to avoid red meat due to elevated uric acid. - Take Integra Plus  every other day with orange juice or vitamin C to enhance absorption - Increase dietary intake of iron -rich foods, avoid red meat - Use stool softeners or prune juice to manage constipation - Follow-up in six months  Elevated Uric Acid Elevated uric acid levels likely exacerbated by dietary intake of red meat, limiting dietary iron  intake from red meat. - Avoid red  meat to manage uric acid levels  Peripheral Neuropathy Ongoing pain and weakness in feet, managed with Lyrica  by her neurologist. Increasing doses of Lyrica  have not provided significant relief and may cause drowsiness and weakness. Discussed that increasing Lyrica  may not be beneficial and could lead to more side effects. - Continue current management with Lyrica  - continue care by neurologist for further evaluation and management  General Health Maintenance Improved iron  levels post-hysterectomy. No current need for iron  infusion. - Continue monitoring iron  levels and general health - Follow-up in six months  Follow-up - Schedule follow-up appointment in six months - Provide refill of Integra Plus  as needed.   The patient was advised to call immediately if she has any other concerning symptoms in the interval. The patient voices understanding of current disease status and treatment options and is in agreement with the current care plan.  All questions were answered. The patient knows to call the clinic with any problems, questions or concerns. We can certainly see the patient much sooner if necessary.  Disclaimer: This note was dictated with voice recognition software. Similar sounding words can inadvertently be transcribed and may not be corrected upon review.

## 2023-03-15 ENCOUNTER — Other Ambulatory Visit: Payer: Self-pay | Admitting: Internal Medicine

## 2023-06-14 DIAGNOSIS — D649 Anemia, unspecified: Secondary | ICD-10-CM | POA: Diagnosis not present

## 2023-06-14 DIAGNOSIS — E0500 Thyrotoxicosis with diffuse goiter without thyrotoxic crisis or storm: Secondary | ICD-10-CM | POA: Diagnosis not present

## 2023-06-14 DIAGNOSIS — E1165 Type 2 diabetes mellitus with hyperglycemia: Secondary | ICD-10-CM | POA: Diagnosis not present

## 2023-06-14 DIAGNOSIS — E785 Hyperlipidemia, unspecified: Secondary | ICD-10-CM | POA: Diagnosis not present

## 2023-06-14 DIAGNOSIS — E039 Hypothyroidism, unspecified: Secondary | ICD-10-CM | POA: Diagnosis not present

## 2023-06-14 DIAGNOSIS — M109 Gout, unspecified: Secondary | ICD-10-CM | POA: Diagnosis not present

## 2023-06-14 DIAGNOSIS — E559 Vitamin D deficiency, unspecified: Secondary | ICD-10-CM | POA: Diagnosis not present

## 2023-06-14 DIAGNOSIS — G609 Hereditary and idiopathic neuropathy, unspecified: Secondary | ICD-10-CM | POA: Diagnosis not present

## 2023-07-22 DIAGNOSIS — M79641 Pain in right hand: Secondary | ICD-10-CM | POA: Diagnosis not present

## 2023-07-22 DIAGNOSIS — M109 Gout, unspecified: Secondary | ICD-10-CM | POA: Diagnosis not present

## 2023-07-22 DIAGNOSIS — M79673 Pain in unspecified foot: Secondary | ICD-10-CM | POA: Diagnosis not present

## 2023-07-22 DIAGNOSIS — M79672 Pain in left foot: Secondary | ICD-10-CM | POA: Diagnosis not present

## 2023-07-22 DIAGNOSIS — G629 Polyneuropathy, unspecified: Secondary | ICD-10-CM | POA: Diagnosis not present

## 2023-07-22 DIAGNOSIS — M79671 Pain in right foot: Secondary | ICD-10-CM | POA: Diagnosis not present

## 2023-07-22 DIAGNOSIS — E1165 Type 2 diabetes mellitus with hyperglycemia: Secondary | ICD-10-CM | POA: Diagnosis not present

## 2023-07-22 DIAGNOSIS — E559 Vitamin D deficiency, unspecified: Secondary | ICD-10-CM | POA: Diagnosis not present

## 2023-07-22 DIAGNOSIS — M79642 Pain in left hand: Secondary | ICD-10-CM | POA: Diagnosis not present

## 2023-07-22 DIAGNOSIS — M2550 Pain in unspecified joint: Secondary | ICD-10-CM | POA: Diagnosis not present

## 2023-07-22 DIAGNOSIS — M79646 Pain in unspecified finger(s): Secondary | ICD-10-CM | POA: Diagnosis not present

## 2023-08-01 DIAGNOSIS — E1142 Type 2 diabetes mellitus with diabetic polyneuropathy: Secondary | ICD-10-CM | POA: Diagnosis not present

## 2023-08-01 DIAGNOSIS — M722 Plantar fascial fibromatosis: Secondary | ICD-10-CM | POA: Diagnosis not present

## 2023-08-01 DIAGNOSIS — G629 Polyneuropathy, unspecified: Secondary | ICD-10-CM | POA: Diagnosis not present

## 2023-08-17 DIAGNOSIS — E1165 Type 2 diabetes mellitus with hyperglycemia: Secondary | ICD-10-CM | POA: Diagnosis not present

## 2023-08-17 DIAGNOSIS — E559 Vitamin D deficiency, unspecified: Secondary | ICD-10-CM | POA: Diagnosis not present

## 2023-08-17 DIAGNOSIS — E039 Hypothyroidism, unspecified: Secondary | ICD-10-CM | POA: Diagnosis not present

## 2023-08-17 DIAGNOSIS — G629 Polyneuropathy, unspecified: Secondary | ICD-10-CM | POA: Diagnosis not present

## 2023-08-17 DIAGNOSIS — E785 Hyperlipidemia, unspecified: Secondary | ICD-10-CM | POA: Diagnosis not present

## 2023-08-17 DIAGNOSIS — G609 Hereditary and idiopathic neuropathy, unspecified: Secondary | ICD-10-CM | POA: Diagnosis not present

## 2023-08-17 DIAGNOSIS — E0500 Thyrotoxicosis with diffuse goiter without thyrotoxic crisis or storm: Secondary | ICD-10-CM | POA: Diagnosis not present

## 2023-08-17 DIAGNOSIS — D649 Anemia, unspecified: Secondary | ICD-10-CM | POA: Diagnosis not present

## 2023-08-31 ENCOUNTER — Telehealth: Payer: Self-pay | Admitting: Internal Medicine

## 2023-08-31 NOTE — Telephone Encounter (Signed)
 Rescheduled appointments per the patients request.

## 2023-09-01 DIAGNOSIS — Z01419 Encounter for gynecological examination (general) (routine) without abnormal findings: Secondary | ICD-10-CM | POA: Diagnosis not present

## 2023-09-01 DIAGNOSIS — Z1272 Encounter for screening for malignant neoplasm of vagina: Secondary | ICD-10-CM | POA: Diagnosis not present

## 2023-09-01 DIAGNOSIS — R300 Dysuria: Secondary | ICD-10-CM | POA: Diagnosis not present

## 2023-09-07 ENCOUNTER — Ambulatory Visit: Payer: 59 | Admitting: Internal Medicine

## 2023-09-07 ENCOUNTER — Other Ambulatory Visit: Payer: 59

## 2023-09-29 DIAGNOSIS — N938 Other specified abnormal uterine and vaginal bleeding: Secondary | ICD-10-CM | POA: Diagnosis not present

## 2023-09-29 DIAGNOSIS — K51811 Other ulcerative colitis with rectal bleeding: Secondary | ICD-10-CM | POA: Diagnosis not present

## 2023-11-08 ENCOUNTER — Inpatient Hospital Stay: Attending: Internal Medicine

## 2023-11-08 ENCOUNTER — Inpatient Hospital Stay: Admitting: Internal Medicine

## 2023-11-08 VITALS — HR 75 | Resp 17 | Ht 65.0 in | Wt 151.0 lb

## 2023-11-08 DIAGNOSIS — N920 Excessive and frequent menstruation with regular cycle: Secondary | ICD-10-CM | POA: Diagnosis not present

## 2023-11-08 DIAGNOSIS — D500 Iron deficiency anemia secondary to blood loss (chronic): Secondary | ICD-10-CM

## 2023-11-08 LAB — CBC WITH DIFFERENTIAL (CANCER CENTER ONLY)
Abs Immature Granulocytes: 0.04 K/uL (ref 0.00–0.07)
Basophils Absolute: 0.1 K/uL (ref 0.0–0.1)
Basophils Relative: 1 %
Eosinophils Absolute: 0.2 K/uL (ref 0.0–0.5)
Eosinophils Relative: 2 %
HCT: 37.4 % (ref 36.0–46.0)
Hemoglobin: 12.6 g/dL (ref 12.0–15.0)
Immature Granulocytes: 0 %
Lymphocytes Relative: 27 %
Lymphs Abs: 2.8 K/uL (ref 0.7–4.0)
MCH: 28.3 pg (ref 26.0–34.0)
MCHC: 33.7 g/dL (ref 30.0–36.0)
MCV: 83.9 fL (ref 80.0–100.0)
Monocytes Absolute: 0.6 K/uL (ref 0.1–1.0)
Monocytes Relative: 6 %
Neutro Abs: 6.4 K/uL (ref 1.7–7.7)
Neutrophils Relative %: 64 %
Platelet Count: 294 K/uL (ref 150–400)
RBC: 4.46 MIL/uL (ref 3.87–5.11)
RDW: 12.9 % (ref 11.5–15.5)
WBC Count: 10.1 K/uL (ref 4.0–10.5)
nRBC: 0 % (ref 0.0–0.2)

## 2023-11-08 LAB — IRON AND IRON BINDING CAPACITY (CC-WL,HP ONLY)
Iron: 51 ug/dL (ref 28–170)
Saturation Ratios: 13 % (ref 10.4–31.8)
TIBC: 393 ug/dL (ref 250–450)
UIBC: 343 ug/dL

## 2023-11-08 NOTE — Progress Notes (Signed)
 [facility] Telephone:(336) [phone]   Fax:(336) [phone]  OFFICE PROGRESS NOTE  Pa, [facility] [address], [address] [place] [address]  DIAGNOSIS: severe iron  deficiency anemia secondary to chronic gynecologic blood loss.  PRIOR THERAPY:  1) Iron  infusion with Feraheme  510 mg IV weekly for 2 doses.  Last dose was given on May 04, 2020. 2) iron  infusion with Venofer  300 Mg IV weekly for 3 weeks last dose was given in May 2024.  CURRENT THERAPY: Oral iron  tablet with Integra +1 capsule p.o. daily.  INTERVAL HISTORY: Briana Fuentes 48 y.o. female returns to the clinic today for follow-up visit.Discussed the use of AI scribe software for clinical note transcription with the patient, who gave verbal consent to proceed.  History of Present Illness Briana Fuentes is a 48 year old female with severe iron  deficiency anemia secondary to menorrhagia who presents with weakness. She is accompanied by her ten-month-old daughter, who was born via Manufacturing systems engineer.  She has a history of severe iron  deficiency anemia secondary to menorrhagia, previously managed with iron  infusions using Feraheme  and Venofer , and oral iron  supplementation with Integra Plus  capsules. She underwent a hysterectomy with bilateral salpingo-oophorectomy on Jul 21, 2022, and reports no bleeding since the procedure.  Currently, she experiences significant weakness, describing her condition as worsening. She denies any bleeding since the hysterectomy. She takes Integra three times a week but not daily due to constipation.  She notes a change in skin color, reporting that her skin is getting darker, and states there is no family history of similar skin changes. There is no family history of similar skin changes. Her life is busy with four children, including a ten-month-old daughter born via surrogacy, and other children aged fifteen, sixteen, and ten.     MEDICAL HISTORY: Past Medical  History:  Diagnosis Date   Anxiety    resolved   Gestational diabetes    Hypothyroidism    endocronologist, Dr. Tommas   Infertility, female    Iron  deficiency anemia secondary to blood loss (chronic)    due to menorrhagia, s/p iron  infusions, most recent 06/2022, Dr. Sherrod, Hematology   Neuromuscular disorder [facility])    Neuropathy    nerve pain   Toxic multinodular goiter 2006   right thyroid  lobectomy 2006, recurrent hyperthyroidism with radioactive thyroid  treatment, 12/2008 with Dr. Tommas   Type 2 diabetes mellitus (HCC)    endocronogist, Dr. Tommas    ALLERGIES:  is allergic to codeine.  MEDICATIONS:  Current Outpatient Medications  Medication Sig Dispense Refill   FeFum-FePoly-FA-B Cmp-C-Biot (INTEGRA PLUS ) CAPS TAKE 1 CAPSULE BY MOUTH DAILY 30 capsule 3   levothyroxine  (SYNTHROID ) 125 MCG tablet Take 125 mcg by mouth daily before breakfast.     metFORMIN  (GLUCOPHAGE -XR) 500 MG 24 hr tablet TAKE 1 TABLET BY MOUTH TWICE A DAY WITH MEALS (Patient taking differently: Take 500-1,000 mg by mouth in the morning and at bedtime. Take 2 tablets (1000 mg) in the morning and Take 1 tablet (500 mg) at bedtime) 60 tablet 0   pregabalin  (LYRICA ) 100 MG capsule Take 150 mg by mouth daily.     Current Facility-Administered Medications  Medication Dose Route Frequency Provider Last Rate Last Admin   acetaminophen  (TYLENOL ) tablet 650 mg  650 mg Oral Once Sherrod Sherrod, MD       diphenhydrAMINE  (BENADRYL ) capsule 25 mg  25 mg Oral Once Aliceson Dolbow, MD        SURGICAL HISTORY:  Past Surgical History:  Procedure Laterality Date   CESAREAN SECTION N/A 08/10/2012   Procedure: CESAREAN SECTION;  Surgeon: Winton Felt, MD;  Location: [facility];  Service: Obstetrics;  Laterality: N/A;  Primary Cesarean Section Delivery Baby Girl @ 2126, Apgars 9/9   CYSTOSCOPY N/A 07/21/2022   Procedure: CYSTOSCOPY;  Surgeon: Sarrah Browning, MD;  Location: [facility];  Service: Gynecology;   Laterality: N/A;   ROBOTIC ASSISTED TOTAL HYSTERECTOMY WITH BILATERAL SALPINGO OOPHERECTOMY N/A 07/21/2022   Procedure: XI ROBOTIC ASSISTED TOTAL HYSTERECTOMY WITH BILATERAL SALPINGECTOMY;  Surgeon: Sarrah Browning, MD;  Location: [facility] [place];  Service: Gynecology;  Laterality: N/A;   THYROIDECTOMY  2006   right thyroid  lobectomy    REVIEW OF SYSTEMS:  A comprehensive review of systems was negative except for: Constitutional: positive for fatigue   PHYSICAL EXAMINATION: General appearance: alert, cooperative, fatigued, and no distress Head: Normocephalic, without obvious abnormality, atraumatic Neck: no adenopathy, no JVD, supple, symmetrical, trachea midline, and thyroid  not enlarged, symmetric, no tenderness/mass/nodules Lymph nodes: Cervical, supraclavicular, and axillary nodes normal. Resp: clear to auscultation bilaterally Back: symmetric, no curvature. ROM normal. No CVA tenderness. Cardio: regular rate and rhythm, S1, S2 normal, no murmur, click, rub or gallop GI: soft, non-tender; bowel sounds normal; no masses,  no organomegaly Extremities: extremities normal, atraumatic, no cyanosis or edema  ECOG PERFORMANCE STATUS: 1 - Symptomatic but completely ambulatory  Pulse 75, resp. rate 17, height 5' 5 (1.651 m), weight 151 lb (68.5 kg), SpO2 95%.  LABORATORY DATA: Lab Results  Component Value Date   WBC 11.3 (H) 03/09/2023   HGB 12.6 03/09/2023   HCT 37.5 03/09/2023   MCV 88.4 03/09/2023   PLT 279 03/09/2023      Chemistry      Component Value Date/Time   NA 134 (L) 07/21/2022 0835   K 4.2 07/21/2022 0835   CL 105 07/21/2022 0835   CO2 18 (L) 07/21/2022 0835   BUN 14 07/21/2022 0835   CREATININE 0.85 07/21/2022 0835   CREATININE 0.71 04/22/2020 1037   CREATININE 0.51 05/01/2012 1104      Component Value Date/Time   CALCIUM 8.6 (L) 07/21/2022 0835   ALKPHOS 53 06/05/2022 0022   AST 18 06/05/2022 0022   AST 10 (L) 04/22/2020 1037   ALT 14  06/05/2022 0022   ALT 10 04/22/2020 1037   BILITOT 0.3 06/05/2022 0022   BILITOT 0.2 11/17/2021 1408   BILITOT 0.3 04/22/2020 1037       RADIOGRAPHIC STUDIES: No results found.  ASSESSMENT AND PLAN: This is a very pleasant 48 years old female with persistent iron  deficiency anemia secondary to menorrhagia.  The patient was treated recently with iron  infusion with Feraheme  510 mg IV weekly for 2 doses.  She also recently received iron  infusion with Venofer  300 Mg IV weekly for 3 weeks last dose was given in July 2023. She received iron  infusion with Venofer  in May 2024 She is currently on Integra +1 capsule p.o. 3 times a week. Repeat CBC today showed normal hemoglobin and hematocrit.  Iron  study and ferritin are still pending. Assessment and Plan Assessment & Plan Iron  deficiency anemia status post hysterectomy with bilateral oophorectomy Iron  deficiency anemia secondary to menorrhagia, previously treated with iron  infusions and oral iron  supplements. Status post hysterectomy with bilateral oophorectomy on Jul 21, 2022, resulting in significant improvement. Current hemoglobin level is well-managed at 12.6 g/dL. Awaiting iron  level results to determine if further iron  supplementation is necessary. Discussed potential hormonal changes  due to oophorectomy affecting skin pigmentation. - Await iron  level results - If iron  levels are low, arrange for iron  infusion - Advise to continue taking Integra Plus  three times a week - Encourage dietary intake of red meat twice a week - Refer to gynecologist for evaluation of hormonal changes and potential hormone replacement therapy  Weakness Reports feeling weak despite well-managed hemoglobin levels. Weakness may be related to intermittent adherence to iron  supplementation or hormonal changes post-oophorectomy. She was advised to call if she has any concerning symptoms in the interval. The patient voices understanding of current disease status and  treatment options and is in agreement with the current care plan.  All questions were answered. The patient knows to call the clinic with any problems, questions or concerns. We can certainly see the patient much sooner if necessary.  Disclaimer: This note was dictated with voice recognition software. Similar sounding words can inadvertently be transcribed and may not be corrected upon review.

## 2023-11-09 LAB — FERRITIN: Ferritin: 74 ng/mL (ref 11–307)

## 2023-12-03 DIAGNOSIS — M79672 Pain in left foot: Secondary | ICD-10-CM | POA: Diagnosis not present

## 2023-12-03 DIAGNOSIS — M7752 Other enthesopathy of left foot: Secondary | ICD-10-CM | POA: Diagnosis not present

## 2023-12-03 DIAGNOSIS — M79671 Pain in right foot: Secondary | ICD-10-CM | POA: Diagnosis not present

## 2023-12-03 DIAGNOSIS — M7751 Other enthesopathy of right foot: Secondary | ICD-10-CM | POA: Diagnosis not present

## 2023-12-03 DIAGNOSIS — M7750 Other enthesopathy of unspecified foot: Secondary | ICD-10-CM | POA: Diagnosis not present

## 2023-12-13 DIAGNOSIS — R749 Abnormal serum enzyme level, unspecified: Secondary | ICD-10-CM | POA: Diagnosis not present

## 2024-01-03 DIAGNOSIS — M7751 Other enthesopathy of right foot: Secondary | ICD-10-CM | POA: Diagnosis not present

## 2024-01-03 DIAGNOSIS — M7752 Other enthesopathy of left foot: Secondary | ICD-10-CM | POA: Diagnosis not present

## 2024-01-10 DIAGNOSIS — M2550 Pain in unspecified joint: Secondary | ICD-10-CM | POA: Diagnosis not present

## 2024-01-10 DIAGNOSIS — E785 Hyperlipidemia, unspecified: Secondary | ICD-10-CM | POA: Diagnosis not present

## 2024-01-10 DIAGNOSIS — E039 Hypothyroidism, unspecified: Secondary | ICD-10-CM | POA: Diagnosis not present

## 2024-01-10 DIAGNOSIS — M109 Gout, unspecified: Secondary | ICD-10-CM | POA: Diagnosis not present

## 2024-01-23 DIAGNOSIS — E1165 Type 2 diabetes mellitus with hyperglycemia: Secondary | ICD-10-CM | POA: Diagnosis not present

## 2024-01-23 DIAGNOSIS — E559 Vitamin D deficiency, unspecified: Secondary | ICD-10-CM | POA: Diagnosis not present

## 2024-01-23 DIAGNOSIS — E039 Hypothyroidism, unspecified: Secondary | ICD-10-CM | POA: Diagnosis not present

## 2024-01-23 DIAGNOSIS — G609 Hereditary and idiopathic neuropathy, unspecified: Secondary | ICD-10-CM | POA: Diagnosis not present

## 2024-01-23 DIAGNOSIS — E785 Hyperlipidemia, unspecified: Secondary | ICD-10-CM | POA: Diagnosis not present

## 2024-01-23 DIAGNOSIS — E0500 Thyrotoxicosis with diffuse goiter without thyrotoxic crisis or storm: Secondary | ICD-10-CM | POA: Diagnosis not present

## 2024-03-08 NOTE — Progress Notes (Signed)
 Briana Fuentes                                          MRN: [id number]   03/08/2024   The [facility] Quality Team Specialist reviewed this patient medical record for the purposes of chart review for care gap closure. The following were reviewed: chart review for care gap closure-glycemic status assessment.    [facility] Quality Team
